# Patient Record
Sex: Male | Born: 1960 | Race: White | Hispanic: No | Marital: Married | Smoking: Never smoker
Health system: Southern US, Community
[De-identification: ages and names within clinical notes are randomized; demographics above are authoritative.]

## PROBLEM LIST (undated history)

## (undated) DIAGNOSIS — R7309 Other abnormal glucose: Secondary | ICD-10-CM

## (undated) DIAGNOSIS — E785 Hyperlipidemia, unspecified: Secondary | ICD-10-CM

## (undated) DIAGNOSIS — M1990 Unspecified osteoarthritis, unspecified site: Secondary | ICD-10-CM

## (undated) DIAGNOSIS — M069 Rheumatoid arthritis, unspecified: Secondary | ICD-10-CM

## (undated) DIAGNOSIS — K8020 Calculus of gallbladder without cholecystitis without obstruction: Secondary | ICD-10-CM

## (undated) DIAGNOSIS — M109 Gout, unspecified: Secondary | ICD-10-CM

## (undated) DIAGNOSIS — I10 Essential (primary) hypertension: Secondary | ICD-10-CM

## (undated) HISTORY — DX: Hyperlipidemia, unspecified: E78.5

## (undated) HISTORY — DX: Other abnormal glucose: R73.09

## (undated) HISTORY — DX: Essential (primary) hypertension: I10

## (undated) HISTORY — PX: WISDOM TOOTH EXTRACTION: SHX21

## (undated) HISTORY — DX: Calculus of gallbladder without cholecystitis without obstruction: K80.20

## (undated) HISTORY — DX: Unspecified osteoarthritis, unspecified site: M19.90

## (undated) HISTORY — DX: Rheumatoid arthritis, unspecified: M06.9

---

## 2002-11-03 ENCOUNTER — Ambulatory Visit (HOSPITAL_COMMUNITY): Admission: RE | Admit: 2002-11-03 | Discharge: 2002-11-03 | Payer: Self-pay | Admitting: Internal Medicine

## 2002-11-03 ENCOUNTER — Encounter: Payer: Self-pay | Admitting: Internal Medicine

## 2003-10-17 ENCOUNTER — Encounter: Admission: RE | Admit: 2003-10-17 | Discharge: 2003-10-17 | Payer: Self-pay | Admitting: Rheumatology

## 2012-01-19 ENCOUNTER — Ambulatory Visit: Payer: 59 | Admitting: Emergency Medicine

## 2012-01-19 ENCOUNTER — Emergency Department (HOSPITAL_COMMUNITY)
Admission: EM | Admit: 2012-01-19 | Discharge: 2012-01-20 | Disposition: A | Discharge status: Home / Self Care | Payer: 59 | Attending: Emergency Medicine | Admitting: Emergency Medicine

## 2012-01-19 ENCOUNTER — Encounter (HOSPITAL_COMMUNITY): Payer: Self-pay | Admitting: *Deleted

## 2012-01-19 VITALS — BP 138/85 | HR 81 | Temp 98.1°F | Resp 16 | Ht 70.0 in | Wt 282.6 lb

## 2012-01-19 DIAGNOSIS — L03119 Cellulitis of unspecified part of limb: Secondary | ICD-10-CM | POA: Insufficient documentation

## 2012-01-19 DIAGNOSIS — Z791 Long term (current) use of non-steroidal anti-inflammatories (NSAID): Secondary | ICD-10-CM | POA: Insufficient documentation

## 2012-01-19 DIAGNOSIS — M129 Arthropathy, unspecified: Secondary | ICD-10-CM | POA: Insufficient documentation

## 2012-01-19 DIAGNOSIS — R21 Rash and other nonspecific skin eruption: Secondary | ICD-10-CM | POA: Insufficient documentation

## 2012-01-19 DIAGNOSIS — L03116 Cellulitis of left lower limb: Secondary | ICD-10-CM

## 2012-01-19 DIAGNOSIS — Z79899 Other long term (current) drug therapy: Secondary | ICD-10-CM | POA: Insufficient documentation

## 2012-01-19 DIAGNOSIS — L02419 Cutaneous abscess of limb, unspecified: Secondary | ICD-10-CM | POA: Insufficient documentation

## 2012-01-19 DIAGNOSIS — R509 Fever, unspecified: Secondary | ICD-10-CM | POA: Insufficient documentation

## 2012-01-19 DIAGNOSIS — M13169 Monoarthritis, not elsewhere classified, unspecified knee: Secondary | ICD-10-CM

## 2012-01-19 DIAGNOSIS — M109 Gout, unspecified: Secondary | ICD-10-CM | POA: Insufficient documentation

## 2012-01-19 DIAGNOSIS — M1710 Unilateral primary osteoarthritis, unspecified knee: Secondary | ICD-10-CM

## 2012-01-19 DIAGNOSIS — M009 Pyogenic arthritis, unspecified: Secondary | ICD-10-CM

## 2012-01-19 HISTORY — DX: Gout, unspecified: M10.9

## 2012-01-19 LAB — BASIC METABOLIC PANEL
Calcium: 9 mg/dL (ref 8.4–10.5)
GFR calc non Af Amer: >90 mL/min (ref >90)
Glucose, Bld: 114 mg/dL — ABNORMAL HIGH (ref 70–99)
Sodium: 137 mEq/L (ref 135–145)

## 2012-01-19 LAB — POCT CBC
Granulocyte percent: 80.9 %G — AB (ref 37–80)
HCT, POC: 46.2 % (ref 43.5–53.7)
Hemoglobin: 14.6 g/dL (ref 14.1–18.1)
Lymph, poc: 2.5 (ref 0.6–3.4)
MCH, POC: 31 pg (ref 27–31.2)
MCHC: 31.6 g/dL — AB (ref 31.8–35.4)
MCV: 98.1 fL — AB (ref 80–97)
MID (cbc): 1.5 — AB (ref 0–0.9)
MPV: 8.5 fL (ref 0–99.8)
POC Granulocyte: 16.7 — AB (ref 2–6.9)
POC LYMPH PERCENT: 11.9 %L (ref 10–50)
POC MID %: 7.2 %M (ref 0–12)
Platelet Count, POC: 332 10*3/uL (ref 142–424)
RBC: 4.71 M/uL (ref 4.69–6.13)
RDW, POC: 14.4 %
WBC: 20.7 10*3/uL — AB (ref 4.6–10.2)

## 2012-01-19 LAB — POCT SEDIMENTATION RATE: POCT SED RATE: 77 mm/hr — AB (ref 0–22)

## 2012-01-19 MED ORDER — SODIUM CHLORIDE 0.9 % IV BOLUS (SEPSIS)
1000.0000 mL | Freq: Once | INTRAVENOUS | Status: AC
  Administered 2012-01-19: 1000 mL via INTRAVENOUS

## 2012-01-19 MED ORDER — ACETAMINOPHEN 325 MG PO TABS
650.0000 mg | ORAL_TABLET | Freq: Once | ORAL | Status: AC
  Administered 2012-01-19: 650 mg via ORAL
  Filled 2012-01-19: qty 2

## 2012-01-19 MED ORDER — CLINDAMYCIN PHOSPHATE 900 MG/50ML IV SOLN
900.0000 mg | Freq: Once | INTRAVENOUS | Status: AC
  Administered 2012-01-19: 900 mg via INTRAVENOUS
  Filled 2012-01-19: qty 50

## 2012-01-19 MED ORDER — CLINDAMYCIN PHOSPHATE 900 MG/50ML IV SOLN: 900.0000 mg | Freq: Once | INTRAVENOUS | Status: DC

## 2012-01-19 MED ORDER — IBUPROFEN 400 MG PO TABS
400.0000 mg | ORAL_TABLET | Freq: Once | ORAL | Status: AC
  Administered 2012-01-19: 400 mg via ORAL
  Filled 2012-01-19: qty 1

## 2012-01-19 MED ORDER — CLINDAMYCIN HCL 300 MG PO CAPS: 300.0000 mg | ORAL_CAPSULE | Freq: Four times a day (QID) | ORAL | Status: DC

## 2012-01-19 NOTE — ED Notes (Signed)
Pt c/o bing cold.  Temp checked elevated

## 2012-01-19 NOTE — ED Provider Notes (Signed)
History     CSN: [id number]  Arrival date & time 01/19/12  1708   First MD Initiated Contact with Patient 01/19/12 2002      Chief Complaint  Patient presents with  . Knee Pain   HPI  History provided by the patient. Patient is a 52 year old male with past history of gout and arthritis who presents with complaints of increased left knee pain and swelling. Patient reports having some waxing waning left knee pain and swelling over the past few days. Patient plan to call his rheumatologist today but was unable to be seen in the office and was seen at a local urgent care Center. Patient has had some associated symptoms of chills and sweats and during evaluation at the urgent care Center was found to be febrile and have an elevated WBC. Patient was sent to the emergency room for further evaluation and to rule out septic joint. Patient states that he has some pain in the knee is worse with standing but this occasionally improves with increased movement and walking. Currently he denies significant pains does report having some chills. Patient otherwise states that he feels well. Patient was initially treating his pain and symptoms with ibuprofen and prednisone which she had at home. Patient reports taking 30 mg for the past 3 days. He denies any other associated symptoms. Denies any numbness or weakness. Denies any erythematous streaks.     Past Medical History  Diagnosis Date  . Arthritis   . Gout     History reviewed. No pertinent past surgical history.  History reviewed. No pertinent family history.  History  Substance Use Topics  . Smoking status: Never Smoker   . Smokeless tobacco: Not on file  . Alcohol Use: Yes      Review of Systems  Constitutional: Positive for fever and chills.  Musculoskeletal:       Left knee pain and swelling  All other systems reviewed and are negative.    Allergies  Review of patient's allergies indicates no known allergies.  Home Medications     Current Outpatient Rx  Name  Route  Sig  Dispense  Refill  . ALLOPURINOL 300 MG PO TABS   Oral   Take 300 mg by mouth 2 (two) times a week. On Friday and Tuesday         . AMMONIUM LACTATE 12 % EX LOTN   Topical   Apply 1 application topically as needed.         Marland Kitchen ETANERCEPT 50 MG/ML [place] SOLN   Subcutaneous   Inject 50 mg into the skin once a week. On sundays         . OMEGA-3 FATTY ACIDS 1000 MG PO CAPS   Oral   Take 2 g by mouth 2 (two) times daily.         Marland Kitchen FOLIC ACID 1 MG PO TABS   Oral   Take 1 mg by mouth daily.         . IBUPROFEN 200 MG PO TABS   Oral   Take 800 mg by mouth every 6 (six) hours as needed. For pain         . METHOTREXATE (ANTI-RHEUMATIC) 2.5 MG PO TABS   Oral   Take 7.5 mg by mouth 2 (two) times a week. Patient takes 2 tablets in Tuesdays and 3 tablets on Wednesday         . ONE-DAILY MULTI VITAMINS PO TABS   Oral   Take 1  tablet by mouth daily.         Marland Kitchen PREDNISONE 5 MG PO TABS   Oral   Take 5 mg by mouth 2 (two) times daily.         Marland Kitchen VITAMIN C 500 MG PO TABS   Oral   Take 500 mg by mouth 2 (two) times daily.           BP 143/69  Pulse 98  Temp 100.6 F (38.1 C) (Oral)  Resp 20  SpO2 98%  Physical Exam  Nursing note and vitals reviewed. Constitutional: He is oriented to person, place, and time. He appears well-developed and well-nourished. No distress.  HENT:  Head: Normocephalic.  Cardiovascular: Normal rate and regular rhythm.   Pulmonary/Chest: Effort normal and breath sounds normal. No respiratory distress. He has no wheezes. He has no rales.  Musculoskeletal: He exhibits edema and tenderness.       Very slightly reduced ROM in left knee.  Knee is diffusly swollen with erythema and increased warm.  Normal distal sensations and pulses in foot.    Neurological: He is alert and oriented to person, place, and time.  Skin: He is diaphoretic.  Psychiatric: He has a normal mood and affect. His behavior is  normal.    ED Course  Procedures   Results for orders placed in visit on 01/19/12  POCT CBC      Component Value Range   WBC 20.7 (*) 4.6 - 10.2 K/uL   Lymph, poc 2.5  0.6 - 3.4   POC LYMPH PERCENT 11.9  10 - 50 %L   MID (cbc) 1.5 (*) 0 - 0.9   POC MID % 7.2  0 - 12 %M   POC Granulocyte 16.7 (*) 2 - 6.9   Granulocyte percent 80.9 (*) 37 - 80 %G   RBC 4.71  4.69 - 6.13 M/uL   Hemoglobin 14.6  14.1 - 18.1 g/dL   HCT, POC 16.1  09.6 - 53.7 %   MCV 98.1 (*) 80 - 97 fL   MCH, POC 31.0  27 - 31.2 pg   MCHC 31.6 (*) 31.8 - 35.4 g/dL   RDW, POC 04.5     Platelet Count, POC 332  142 - 424 K/uL   MPV 8.5  0 - 99.8 fL  POCT SEDIMENTATION RATE      Component Value Range   POCT SED RATE 77 (*) 0 - 22 mm/hr   Results for orders placed during the hospital encounter of 01/19/12  BASIC METABOLIC PANEL      Component Value Range   Sodium 137  135 - 145 mEq/L   Potassium 3.5  3.5 - 5.1 mEq/L   Chloride 102  96 - 112 mEq/L   CO2 25  19 - 32 mEq/L   Glucose, Bld 114 (*) 70 - 99 mg/dL   BUN 20  6 - 23 mg/dL   Creatinine, Ser 4.09  0.50 - 1.35 mg/dL   Calcium 9.0  8.4 - 81.1 mg/dL   GFR calc non Af Amer >90  >90 mL/min   GFR calc Af Amer >90  >90 mL/min       1. Cellulitis of left knee       MDM  8:10PM Pt seen and evaluated.  Pt does not appear in any acute distress.  Patient seen and discussed with attending physician. Will plan to consult orthopedics though very low suspicion for septic joint at this time.  Spoke with Dr. Ave Filter with  orthopedics. He will see patient for further evaluation.  Dr. Ave Filter has seen patient is feel symptoms are consistent with septic joint are more consistent with superficial cellulitis. He feels patient can be treated with antibiotics for cellulitis.  Spoke with [facility] hospitalists for possible admission. Patient has no significant medical history specifically no history for any immunocompromising health problems, or diabetes. Patient prefers  to return home at this time is felt that he may return home for attempt at outpatient treatment for cellulitis. Will give first dose of IV clindamycin in the emergency room. Patient given strict return precautions.      Angus Seller, [place] 01/20/12 [phone]

## 2012-01-19 NOTE — ED Notes (Signed)
Pt reports having onset of left knee pain and inflammation on 1/1, thought it was arthritis but now having more pain, swelling, redness. Went to urgent medical and sent here to r/o septic knee. They reported wbc 21 and fevers/chills.

## 2012-01-19 NOTE — Progress Notes (Signed)
[facility] [address], [place] [address] [phone]  Date:  01/19/2012   Name:  Thomas Waller   DOB:  06-30-1960   MRN:  [id number]  PCP:  No primary provider on file.    Chief Complaint: Knee Pain   History of Present Illness:  Thomas Waller is a 52 y.o. very pleasant male patient who presents with the following:  Has rheumatoid arthritis.  On a number of meds; methotrexate, prednisone, enbrel.  Has three day history of increasing pain and redness in the left knee.  Has swelling and pain.  Hot to touch.  Limited mobility and weight bearing due to pain.  Fever last night and chills yesterday and today.  No further fever today.  Pain increasing  There is no problem list on file for this patient.   Past Medical History  Diagnosis Date  . Arthritis     History reviewed. No pertinent past surgical history.  History  Substance Use Topics  . Smoking status: Never Smoker   . Smokeless tobacco: Not on file  . Alcohol Use: Yes    History reviewed. No pertinent family history.  No Known Allergies  Medication list has been reviewed and updated.  Current Outpatient Prescriptions on File Prior to Visit  Medication Sig Dispense Refill  . allopurinol (ZYLOPRIM) 300 MG tablet Take 300 mg by mouth 2 (two) times a week.      . methotrexate (RHEUMATREX) 2.5 MG tablet Take 7.5 mg by mouth 3 (three) times a week.        Review of Systems:  As per HPI, otherwise negative.    Physical Examination: Filed Vitals:   01/19/12 1455  BP: 138/85  Pulse: 81  Temp: 98.1 F (36.7 C)  Resp: 16   Filed Vitals:   01/19/12 1455  Height: 5\' 10"  (1.778 m)  Weight: 282 lb 9.6 oz (128.187 kg)   Body mass index is 40.55 kg/(m^2). Ideal Body Weight: Weight in (lb) to have BMI = 25: 173.9    GEN: WDWN, NAD, Non-toxic, Alert & Oriented x 3 HEENT: Atraumatic, Normocephalic.  Ears and Nose: No external deformity. EXTR: No clubbing/cyanosis. Left knee swollen  and red with proximal and distal cellulitis.  Left calf taut and swollen not tender.   NEURO: Normal gait.  PSYCH: Normally interactive. Conversant. Not depressed or anxious appearing.  Calm demeanor.    Assessment and Plan: Purulent effusion knee vs cellulitis knee CBC Sed rate TO ER for evaluation as we cannot ignore the possibility of a septic joint in this immunocompromised patient.  Carmelina Dane, MD  Results for orders placed in visit on 01/19/12  POCT CBC      Component Value Range   WBC 20.7 (*) 4.6 - 10.2 K/uL   Lymph, poc 2.5  0.6 - 3.4   POC LYMPH PERCENT 11.9  10 - 50 %L   MID (cbc) 1.5 (*) 0 - 0.9   POC MID % 7.2  0 - 12 %M   POC Granulocyte 16.7 (*) 2 - 6.9   Granulocyte percent 80.9 (*) 37 - 80 %G   RBC 4.71  4.69 - 6.13 M/uL   Hemoglobin 14.6  14.1 - 18.1 g/dL   HCT, POC 69.6  29.5 - 53.7 %   MCV 98.1 (*) 80 - 97 fL   MCH, POC 31.0  27 - 31.2 pg   MCHC 31.6 (*) 31.8 - 35.4 g/dL   RDW, POC 14.4  Platelet Count, POC 332  142 - 424 K/uL   MPV 8.5  0 - 99.8 fL

## 2012-01-19 NOTE — Consult Note (Signed)
Reason for Consult:Left knee pain, swelling Referring Physician: AFTON, MIKELSON is an 52 y.o. male.  HPI: Pt with a history of gout and osteoarthritis in his knee presents with left knee pain, redness and swelling. Pt states symptoms first started 01/17/12 when he noticed some redness over his knee cap. He also started having more increased knee discomfort, states this pain only occurred when he began walking from a seated position and resolved after a few steps. Has no knee pain at rest and with increased ambulation. Does not wake him from sleep. Has noticed increased erythema over the last few days. Yesterday afternoon began developing symptoms of fever, chills, malaise and sweats. Went to urgent care this morning and was sent to ED after WBC count was elevated with temperature of 100 degrees. Now, continues to feel chills and malaise. Denies knee pain. Denies trauma.  Past Medical History  Diagnosis Date  . Arthritis   . Gout     History reviewed. No pertinent past surgical history.  History reviewed. No pertinent family history.  Social History:  reports that he has never smoked. He does not have any smokeless tobacco history on file. He reports that he drinks alcohol. He reports that he does not use illicit drugs.  Allergies: No Known Allergies  Medications: I have reviewed the patient's current medications.  Results for orders placed during the hospital encounter of 01/19/12 (from the past 48 hour(s))  BASIC METABOLIC PANEL     Status: Abnormal   Collection Time   01/19/12  8:54 PM      Component Value Range Comment   Sodium 137  135 - 145 mEq/L    Potassium 3.5  3.5 - 5.1 mEq/L    Chloride 102  96 - 112 mEq/L    CO2 25  19 - 32 mEq/L    Glucose, Bld 114 (*) 70 - 99 mg/dL    BUN 20  6 - 23 mg/dL    Creatinine, Ser 3.08  0.50 - 1.35 mg/dL    Calcium 9.0  8.4 - 65.7 mg/dL    GFR calc non Af Amer >90  >90 mL/min    GFR calc Af Amer >90  >90 mL/min     No results  found.  Review of Systems  Constitutional: Positive for fever and chills.  Musculoskeletal: Negative for myalgias and falls.       Left knee pain with start up ambulation, none at rest.  Skin: Positive for rash. Negative for itching.       On left knee   All other systems reviewed and are negative.   Blood pressure 143/69, pulse 98, temperature 99.8 F (37.7 C), temperature source Oral, resp. rate 20, SpO2 98.00%. Physical Exam  Constitutional: He is oriented to person, place, and time. He appears well-developed and well-nourished.  HENT:  Head: Normocephalic and atraumatic.  Eyes: EOM are normal. Pupils are equal, round, and reactive to light.  Respiratory: Effort normal and breath sounds normal.  Musculoskeletal:       Left knee with moderate swelling, erythema and warmth. Full and painless ROM. No tenderness to palpation. No palpable effusion. No calf tenderness.  Neurological: He is alert and oriented to person, place, and time.  Skin: Skin is warm and dry.       Erythematous rash consistent with cellulitis is appreciated around the knee joint.  Psychiatric: He has a normal mood and affect. His behavior is normal.    Assessment/Plan: Left knee swelling,  erythema with elevated WBC and constitutional symptoms, has full painless ROM. Most likely cellulitis with underlying osteoarthritis. Left knee septic joint unlikely. Will not aspirate Okay to admit and start antibiotics per primary team Will continue to follow  Camrynn Mcclintic 01/19/2012, 9:51 PM

## 2012-01-19 NOTE — ED Provider Notes (Signed)
52 year old male was transferred here from urgent care because of concern about a septic joint. He has been having pain in his left knee for several days and self medicated with her prednisone and ibuprofen. He states his knee hurts the first couple of steps when he tries to walk, but then stops hurting. In the emergency department, he has started to run a fever. There is erythema surrounding the knee but in a pattern that seems more typical of cellulitis. There is a small to moderate joint effusion but there is full range of motion without significant pain. WBC at urgent care was 20,000. I am reluctant to attempt joint aspiration since I then you would be going through the skin that is clinically infected. He'll be started on antibiotics and orthopedic consultation obtained.  Medical screening examination/treatment/procedure(s) were conducted as a shared visit with non-physician practitioner(s) and myself.  I personally evaluated the patient during the encounter   Dione Booze, MD 01/19/12 2050

## 2012-01-19 NOTE — Progress Notes (Signed)
Reviewed and agree.

## 2012-01-20 NOTE — Consult Note (Signed)
Agree with above.  Appears to be only superficial cellulitis without joint or bursal involvement.  Should respond to antibiotics, no surgery indicated.

## 2012-11-16 ENCOUNTER — Encounter: Payer: Self-pay | Admitting: Internal Medicine

## 2012-11-16 DIAGNOSIS — R7309 Other abnormal glucose: Secondary | ICD-10-CM | POA: Insufficient documentation

## 2012-11-16 DIAGNOSIS — E782 Mixed hyperlipidemia: Secondary | ICD-10-CM | POA: Insufficient documentation

## 2012-11-16 DIAGNOSIS — M1000 Idiopathic gout, unspecified site: Secondary | ICD-10-CM | POA: Insufficient documentation

## 2012-11-16 DIAGNOSIS — M1990 Unspecified osteoarthritis, unspecified site: Secondary | ICD-10-CM | POA: Insufficient documentation

## 2012-11-16 DIAGNOSIS — I10 Essential (primary) hypertension: Secondary | ICD-10-CM | POA: Insufficient documentation

## 2012-11-25 ENCOUNTER — Encounter: Payer: Self-pay | Admitting: Internal Medicine

## 2012-12-01 ENCOUNTER — Other Ambulatory Visit: Payer: Self-pay | Admitting: Internal Medicine

## 2013-01-02 ENCOUNTER — Other Ambulatory Visit: Payer: Self-pay | Admitting: Internal Medicine

## 2013-01-07 ENCOUNTER — Encounter: Payer: Self-pay | Admitting: Emergency Medicine

## 2013-01-07 ENCOUNTER — Ambulatory Visit (INDEPENDENT_AMBULATORY_CARE_PROVIDER_SITE_OTHER): Payer: 59 | Admitting: Emergency Medicine

## 2013-01-07 ENCOUNTER — Ambulatory Visit: Payer: Self-pay | Admitting: Internal Medicine

## 2013-01-07 VITALS — BP 138/96 | HR 90 | Temp 98.4°F | Resp 18 | Wt 270.0 lb

## 2013-01-07 DIAGNOSIS — J309 Allergic rhinitis, unspecified: Secondary | ICD-10-CM

## 2013-01-07 DIAGNOSIS — J209 Acute bronchitis, unspecified: Secondary | ICD-10-CM

## 2013-01-07 DIAGNOSIS — J329 Chronic sinusitis, unspecified: Secondary | ICD-10-CM

## 2013-01-07 MED ORDER — AZITHROMYCIN 250 MG PO TABS: ORAL_TABLET | ORAL | Status: AC

## 2013-01-07 MED ORDER — PREDNISONE 10 MG PO TABS: ORAL_TABLET | ORAL | Status: DC

## 2013-01-07 MED ORDER — BENZONATATE 100 MG PO CAPS: 100.0000 mg | ORAL_CAPSULE | Freq: Three times a day (TID) | ORAL | Status: DC | PRN

## 2013-01-07 NOTE — Progress Notes (Signed)
Subjective:    Patient ID: Thomas Waller, male    DOB: 09-29-1960, 52 y.o.   MRN: [id number]  HPI Comments: 52 yo male with 3 days of increased congestion in head, sore throat, ears full and mild production with cough. Nyquil/ dayquill/ OTC allergy no relief.   Current Outpatient Prescriptions on File Prior to Visit  Medication Sig Dispense Refill  . allopurinol (ZYLOPRIM) 300 MG tablet Take 300 mg by mouth 2 (two) times a week. On Friday and Tuesday      . ammonium lactate (LAC-HYDRIN) 12 % lotion Apply 1 application topically as needed.      . B-D 3CC LUER-LOK SYR 21GX1" 21G X 1" 3 ML MISC USE EVERY 2 WEEKS  5 each  PRN  . cholecalciferol (VITAMIN D) 1000 UNITS tablet Take 10 Units by mouth daily. 10,000 IU daily      . etanercept (ENBREL) 50 MG/ML injection Inject 50 mg into the skin once a week. On sundays      . fish oil-omega-3 fatty acids 1000 MG capsule Take 2 g by mouth 2 (two) times daily.      . folic acid (FOLVITE) 1 MG tablet Take 1 mg by mouth daily.      Marland Kitchen ibuprofen (ADVIL,MOTRIN) 200 MG tablet Take 800 mg by mouth every 6 (six) hours as needed. For pain      . methotrexate (RHEUMATREX) 2.5 MG tablet Take 7.5 mg by mouth 2 (two) times a week. Patient takes 2 tablets in Tuesdays and 3 tablets on Wednesday      . Multiple Vitamin (MULTIVITAMIN) tablet Take 1 tablet by mouth daily.      . predniSONE (DELTASONE) 5 MG tablet Take 5 mg by mouth 2 (two) times daily.      Marland Kitchen testosterone cypionate (DEPOTESTOTERONE CYPIONATE) 200 MG/ML injection inject 2 milliliters intramuscularly EVERY 2 WEEKS  10 mL  1  . vitamin B-12 (CYANOCOBALAMIN) 1000 MCG tablet Take 1,000 mcg by mouth daily.      . vitamin C (ASCORBIC ACID) 500 MG tablet Take 500 mg by mouth 2 (two) times daily.      . clindamycin (CLEOCIN) 300 MG capsule Take 1 capsule (300 mg total) by mouth 4 (four) times daily. X 7 days  28 capsule  0   No current facility-administered medications on file prior to visit.    Review of  patient's allergies indicates no known allergies.  Past Medical History  Diagnosis Date  . Gout   . Hypertension   . Elevated hemoglobin A1c measurement   . Hyperlipidemia   . Arthritis     Reiters Polyarthritis      Review of Systems  Constitutional: Positive for fatigue.  HENT: Positive for congestion, postnasal drip, sinus pressure and sore throat.   Respiratory: Positive for cough.   All other systems reviewed and are negative.   BP 138/96  Pulse 90  Temp(Src) 98.4 F (36.9 C) (Temporal)  Resp 18  Wt 270 lb (122.471 kg)     Objective:   Physical Exam  Nursing note and vitals reviewed. Constitutional: He is oriented to person, place, and time. He appears well-developed and well-nourished.  HENT:  Head: Normocephalic and atraumatic.  Right Ear: External ear normal.  Left Ear: External ear normal.  Nose: Nose normal.  Mouth/Throat: Oropharynx is clear and moist. No oropharyngeal exudate.  Yellow TMs mildly injected, mild post pharynx erythema  Eyes: Conjunctivae are normal.  Neck: Normal range of motion.  Cardiovascular: Normal rate,  regular rhythm, normal heart sounds and intact distal pulses.   Pulmonary/Chest: Effort normal and breath sounds normal.  Abdominal: Soft.  Musculoskeletal: Normal range of motion.  Lymphadenopathy:    He has no cervical adenopathy.  Neurological: He is alert and oriented to person, place, and time.  Skin: Skin is warm and dry.  Psychiatric: He has a normal mood and affect. Judgment normal.          Assessment & Plan:  1. Sinusitis/ Bronchitis/ Allergic rhinitis- Zpak, Pred 10 mg DP, Tessalon Perles 100mg  all AD. Increase H2o, Allegra OTC AD

## 2013-01-07 NOTE — Patient Instructions (Signed)
Allergic Rhinitis Allergic rhinitis is when the mucous membranes in the nose respond to allergens. Allergens are particles in the air that cause your body to have an allergic reaction. This causes you to release allergic antibodies. Through a chain of events, these eventually cause you to release histamine into the blood stream (hence the use of antihistamines). Although meant to be protective to the body, it is this release that causes your discomfort, such as frequent sneezing, congestion and an itchy runny nose.  CAUSES  The pollen allergens may come from grasses, trees, and weeds. This is seasonal allergic rhinitis, or "hay fever." Other allergens cause year-round allergic rhinitis (perennial allergic rhinitis) such as house dust mite allergen, pet dander and mold spores.  SYMPTOMS   Nasal stuffiness (congestion).  Runny, itchy nose with sneezing and tearing of the eyes.  There is often an itching of the mouth, eyes and ears. It cannot be cured, but it can be controlled with medications. DIAGNOSIS  If you are unable to determine the offending allergen, skin or blood testing may find it. TREATMENT   Avoid the allergen.  Medications and allergy shots (immunotherapy) can help.  Hay fever may often be treated with antihistamines in pill or nasal spray forms. Antihistamines block the effects of histamine. There are over-the-counter medicines that may help with nasal congestion and swelling around the eyes. Check with your caregiver before taking or giving this medicine. If the treatment above does not work, there are many new medications your caregiver can prescribe. Stronger medications may be used if initial measures are ineffective. Desensitizing injections can be used if medications and avoidance fails. Desensitization is when a patient is given ongoing shots until the body becomes less sensitive to the allergen. Make sure you follow up with your caregiver if problems continue. SEEK MEDICAL  CARE IF:   You develop fever (more than 100.5 F (38.1 C).  You develop a cough that does not stop easily (persistent).  You have shortness of breath.  You start wheezing.  Symptoms interfere with normal daily activities. Document Released: 09/27/2000 Document Revised: 03/27/2011 Document Reviewed: 04/08/2008 [employer] Patient Information 2014 [employer]. Sinusitis Sinusitis is redness, soreness, and puffiness (inflammation) of the air pockets in the bones of your face (sinuses). The redness, soreness, and puffiness can cause air and mucus to get trapped in your sinuses. This can allow germs to grow and cause an infection.  HOME CARE   Drink enough fluids to keep your pee (urine) clear or pale yellow.  Use a humidifier in your home.  Run a hot shower to create steam in the bathroom. Sit in the bathroom with the door closed. Breathe in the steam 3 4 times a day.  Put a warm, moist washcloth on your face 3 4 times a day, or as told by your doctor.  Use salt water sprays (saline sprays) to wet the thick fluid in your nose. This can help the sinuses drain.  Only take medicine as told by your doctor. GET HELP RIGHT AWAY IF:   Your pain gets worse.  You have very bad headaches.  You are sick to your stomach (nauseous).  You throw up (vomit).  You are very sleepy (drowsy) all the time.  Your face is puffy (swollen).  Your vision changes.  You have a stiff neck.  You have trouble breathing. MAKE SURE YOU:   Understand these instructions.  Will watch your condition.  Will get help right away if you are not doing well   or get worse. Document Released: 06/21/2007 Document Revised: 09/27/2011 Document Reviewed: 08/08/2011 [employer] Patient Information 2014 [employer].  

## 2013-01-08 ENCOUNTER — Ambulatory Visit: Payer: Self-pay | Admitting: Physician Assistant

## 2013-02-25 ENCOUNTER — Other Ambulatory Visit: Payer: Self-pay | Admitting: Internal Medicine

## 2013-06-21 ENCOUNTER — Encounter: Payer: Self-pay | Admitting: Internal Medicine

## 2013-06-21 DIAGNOSIS — Z79899 Other long term (current) drug therapy: Secondary | ICD-10-CM | POA: Insufficient documentation

## 2013-06-21 DIAGNOSIS — E559 Vitamin D deficiency, unspecified: Secondary | ICD-10-CM | POA: Insufficient documentation

## 2013-06-21 NOTE — Progress Notes (Signed)
Patient ID: Thomas Waller, male   DOB: 1960-03-12, 53 y.o.   MRN: [id number]   Annual Screening Comprehensive Examination  This very nice 53 y.o.MWM presents for complete physical.  Patient has been followed for labile HTN, Morbid Obesity & Insulin Resistance, Prediabetes, Hyperlipidemia, and Vitamin D Deficiency.   Patient's labile HTN predates since Jan 2014 and has been monitored expectantly. Patient's BP has been controlled and today's BP: 146/82 mmHg is slightly elevated.  Patient denies any cardiac symptoms as chest pain, palpitations, shortness of breath, dizziness or ankle swelling.   Patient's hyperlipidemia is controlled with diet. Patient denies myalgias or other medication SE's. Last cholesterol last visit was 187, triglycerides 212, HDL 37 and LDL 108 in Sept 2014.     Patient has Morbid Obesity with BMI 39 and consequent prediabetes and insulin resistance with A1c 6.0% in Jan 2014 and last A1c 5.6% with elevated insulin 139 in Sept 2014. Patient denies reactive hypoglycemic symptoms, visual blurring, diabetic polys or paresthesias.    Finally, patient has history of Vitamin D Deficiency of 51 in Mar  2014 and last vitamin D was 65 in Sept 2014.  Medication Sig  . allopurinol  300 MG tablet Take 300 mg by mouth 2 (two) times a week. On Friday and Tuesday  . LAC-HYDRIN 12 % lotion Apply 1 application topically as needed.  Marland Kitchen 3CC LUER-LOK SYR 21GX1"  3 ML  USE EVERY 2 WEEKS  . VITAMIN D UNITS tablet Take 10,000 IU daily  . ENBREL) 50 MG/ML inj Inject 50 mg into the skin once a week. On sundays  . fish oil1000 MG cap Take 2 g by mouth 2 (two) times daily.  . folic acid  1 MG tablet Take 1 mg by mouth daily.  Marland Kitchen ibuprofen ( 200 MG tablet Take 800 mg by mouth every 6 (six) hours as needed. For pain  . methotrexate  2.5 MG tablet  Patient takes 2 tablets in Tuesdays and 3 tablets on Wednesday  . MULTIVITAMIN Take 1 tablet by mouth daily.  . predniSONE  5 MG tablet Take 5 mg by mouth 2  (two) times daily.  . DEPOTESTOTERONE CYP 200 MG/ML  inject 2 milliliters intramuscularly EVERY 2 WEEKS  . vitamin B-12  1000 MCG tab Take 1,000 mcg by mouth daily.  . vitamin C  500 MG tablet Take 500 mg by mouth 2 (two) times daily.   No Known Allergies  Past Medical History  Diagnosis Date  . Gout   . Hypertension   . Elevated hemoglobin A1c measurement   . Hyperlipidemia   . Arthritis     Reiters Polyarthritis  . Vitamin D deficiency 2013    History reviewed. No pertinent past surgical history.  No family history on file.  History   Social History  . Marital Status: Married    Spouse Name: N/A    Number of Children: N/A  . Years of Education: N/A   Occupational History  . Not on file.   Social History Main Topics  . Smoking status: Never Smoker   . Smokeless tobacco: Not on file  . Alcohol Use: Yes  . Drug Use: No  . Sexual Activity: Yes    Birth Control/ Protection: None    ROS Constitutional: Denies fever, chills, weight loss/gain, headaches, insomnia, fatigue, night sweats or change in appetite. Eyes: Denies redness, blurred vision, diplopia, discharge, itchy or watery eyes.  ENT: Denies discharge, congestion, post nasal drip, epistaxis, sore throat, earache, hearing loss, dental  pain, Tinnitus, Vertigo, Sinus pain or snoring.  Cardio: Denies chest pain, palpitations, irregular heartbeat, syncope, dyspnea, diaphoresis, orthopnea, PND, claudication or edema Respiratory: denies cough, dyspnea, DOE, pleurisy, hoarseness, laryngitis or wheezing.  Gastrointestinal: Denies dysphagia, heartburn, reflux, water brash, pain, cramps, nausea, vomiting, bloating, diarrhea, constipation, hematemesis, melena, hematochezia, jaundice or hemorrhoids Genitourinary: Denies dysuria, frequency, urgency, nocturia, hesitancy, discharge, hematuria or flank pain Musculoskeletal: Denies arthralgia, myalgia, stiffness, Jt. Swelling, pain, limp or strain/sprain. Skin: Denies puritis,  rash, hives, warts, acne, eczema or change in skin lesion Neuro: No weakness, tremor, incoordination, spasms, paresthesia or pain Psychiatric: Denies confusion, memory loss or sensory loss Endocrine: Denies change in weight, skin, hair change, nocturia, and paresthesia, diabetic polys, visual blurring or hyper / hypo glycemic episodes.  Heme/Lymph: No excessive bleeding, bruising or enlarged lymph nodes.  Physical Exam  BP 146/82  Pulse 80  T 99 F  R 16  Ht 5\' 11"   Wt 281 lb  BMI 39.21 kg/m2  General Appearance: Well nourished, in no apparent distress. Eyes: PERRLA, EOMs, conjunctiva no swelling or erythema, normal fundi and vessels. Sinuses: No frontal/maxillary tenderness ENT/Mouth: EACs patent / TMs  nl. Nares clear without erythema, swelling, mucoid exudates. Oral hygiene is good. No erythema, swelling, or exudate. Tongue normal, non-obstructing. Tonsils not swollen or erythematous. Hearing normal.  Neck: Supple, thyroid normal. No bruits, nodes or JVD. Respiratory: Respiratory effort normal.  BS equal and clear bilateral without rales, rhonci, wheezing or stridor. Cardio: Heart sounds are normal with regular rate and rhythm and no murmurs, rubs or gallops. Peripheral pulses are normal and equal bilaterally without edema. No aortic or femoral bruits. Chest: symmetric with normal excursions and percussion.  Abdomen: Flat, soft, with bowl sounds. Nontender, no guarding, rebound, hernias, masses, or organomegaly.  Lymphatics: Non tender without lymphadenopathy.  Genitourinary: No hernias.Testes nl. DRE - prostate nl for age - smooth & firm w/o nodules. Musculoskeletal: Full ROM all peripheral extremities, joint stability, 5/5 strength, and normal gait. Skin: Warm and dry without rashes, lesions, cyanosis, clubbing or  ecchymosis.  Neuro: Cranial nerves intact, reflexes equal bilaterally. Normal muscle tone, no cerebellar symptoms. Sensation intact.  Pysch: Awake and oriented X 3,  normal affect, insight and judgment appropriate.   Assessment and Plan  1. Annual Screening Examination 2. Hypertension  3. Hyperlipidemia 4. Pre Diabetes 5. Vitamin D Deficiency 6. Morbid Obesity 7. Reiter's Polyarthritis 8. Gout  Continue prudent diet as discussed, weight control, BP monitoring, regular exercise, and medications as discussed.  Discussed med effects and SE's. Routine screening labs and tests as requested with regular follow-up as recommended.  Patient declined recommendations for overdue screening colonoscopy, but did agree to [facility] if his insurance carrier will cover.

## 2013-06-21 NOTE — Patient Instructions (Addendum)
Buy Dr Fara Olden Fuhrman's Book  "The End of Dieting"    Get Book  & Audio CD's   From   [email or web address]    Obesity Obesity is defined as having too much total body fat and a body mass index (BMI) of 30 or more. BMI is an estimate of body fat and is calculated from your height and weight. Obesity happens when you consume more calories than you can burn by exercising or performing daily physical tasks. Prolonged obesity can cause major illnesses or emergencies, such as:   A stroke.  Heart disease.  Diabetes.  Cancer.  Arthritis.  High blood pressure (hypertension).  High cholesterol.  Sleep apnea.  Erectile dysfunction.  Infertility problems. CAUSES   Regularly eating unhealthy foods.  Physical inactivity.  Certain disorders, such as an underactive thyroid (hypothyroidism), Cushing's syndrome, and polycystic ovarian syndrome.  Certain medicines, such as steroids, some depression medicines, and antipsychotics.  Genetics.  Lack of sleep. DIAGNOSIS  A caregiver can diagnose obesity after calculating your BMI. Obesity will be diagnosed if your BMI is 30 or higher.  There are other methods of measuring obesity levels. Some other methods include measuring your skin fold thickness, your waist circumference, and comparing your hip circumference to your waist circumference. TREATMENT  A healthy treatment program includes some or all of the following:  Long-term dietary changes.  Exercise and physical activity.  Behavioral and lifestyle changes.  Medicine only under the supervision of your caregiver. Medicines may help, but only if they are used with diet and exercise programs. An unhealthy treatment program includes:  Fasting.  Fad diets.  Supplements and drugs. These choices do not succeed in long-term weight control.  HOME CARE INSTRUCTIONS   Exercise and perform physical activity as directed by your caregiver. To increase physical activity, try the  following:  Use stairs instead of elevators.  Park farther away from store entrances.  Garden, bike, or walk instead of watching television or using the computer.  Eat healthy, low-calorie foods and drinks on a regular basis. Eat more fruits and vegetables. Use low-calorie cookbooks or take healthy cooking classes.  Limit fast food, sweets, and processed snack foods.  Eat smaller portions.  Keep a daily journal of everything you eat. There are many free websites to help you with this. It may be helpful to measure your foods so you can determine if you are eating the correct portion sizes.  Avoid drinking alcohol. Drink more water and drinks without calories.  Take vitamins and supplements only as recommended by your caregiver.  Weight-loss support groups, Nurse, mental health, counselors, and stress reduction education can also be very helpful. SEEK IMMEDIATE MEDICAL CARE IF:  You have chest pain or tightness.  You have trouble breathing or feel short of breath.  You have weakness or leg numbness.  You feel confused or have trouble talking.  You have sudden changes in your vision. MAKE SURE YOU:  Understand these instructions.  Will watch your condition.  Will get help right away if you are not doing well or get worse. Document Released: 02/10/2004 Document Revised: 07/04/2011 Document Reviewed: 02/08/2011 [facility] Patient Information 2014 [place].    Ringworm, Nail A fungal infection of the nail (tinea unguium/onychomycosis) is common. It is common as the visible part of the nail is composed of dead cells which have no blood supply to help prevent infection. It occurs because fungi are everywhere and will pick any opportunity to grow on any dead material. Because nails are very  slow growing they require up to 2 years of treatment with anti-fungal medications. The entire nail back to the base is infected. This includes approximately  of the nail which you cannot  see. If your caregiver has prescribed a medication by mouth, take it every day and as directed. No progress will be seen for at least 6 to 9 months. Do not be disappointed! Because fungi live on dead cells with little or no exposure to blood supply, medication delivery to the infection is slow; thus the cure is slow. It is also why you can observe no progress in the first 6 months. The nail becoming cured is the base of the nail, as it has the blood supply. Topical medication such as creams and ointments are usually not effective. Important in successful treatment of nail fungus is closely following the medication regimen that your doctor prescribes. Sometimes you and your caregiver may elect to speed up this process by surgical removal of all the nails. Even this may still require 6 to 9 months of additional oral medications. See your caregiver as directed. Remember there will be no visible improvement for at least 6 months. See your caregiver sooner if other signs of infection (redness and swelling) develop. Document Released: 12/31/1999 Document Revised: 03/27/2011 Document Reviewed: 03/10/2008 [facility] Patient Information 2014 [place], [place].   Hypertension As your heart beats, it forces blood through your arteries. This force is your blood pressure. If the pressure is too high, it is called hypertension (HTN) or high blood pressure. HTN is dangerous because you may have it and not know it. High blood pressure may mean that your heart has to work harder to pump blood. Your arteries may be narrow or stiff. The extra work puts you at risk for heart disease, stroke, and other problems.  Blood pressure consists of two numbers, a higher number over a lower, 110/72, for example. It is stated as "110 over 72." The ideal is below 120 for the top number (systolic) and under 80 for the bottom (diastolic). Write down your blood pressure today. You should pay close attention to your blood pressure if you have  certain conditions such as:  Heart failure.  Prior heart attack.  Diabetes  Chronic kidney disease.  Prior stroke.  Multiple risk factors for heart disease. To see if you have HTN, your blood pressure should be measured while you are seated with your arm held at the level of the heart. It should be measured at least twice. A one-time elevated blood pressure reading (especially in the Emergency Department) does not mean that you need treatment. There may be conditions in which the blood pressure is different between your right and left arms. It is important to see your caregiver soon for a recheck. Most people have essential hypertension which means that there is not a specific cause. This type of high blood pressure may be lowered by changing lifestyle factors such as:  Stress.  Smoking.  Lack of exercise.  Excessive weight.  Drug/tobacco/alcohol use.  Eating less salt. Most people do not have symptoms from high blood pressure until it has caused damage to the body. Effective treatment can often prevent, delay or reduce that damage. TREATMENT  When a cause has been identified, treatment for high blood pressure is directed at the cause. There are a large number of medications to treat HTN. These fall into several categories, and your caregiver will help you select the medicines that are best for you. Medications may have side  effects. You should review side effects with your caregiver. If your blood pressure stays high after you have made lifestyle changes or started on medicines,   Your medication(s) may need to be changed.  Other problems may need to be addressed.  Be certain you understand your prescriptions, and know how and when to take your medicine.  Be sure to follow up with your caregiver within the time frame advised (usually within two weeks) to have your blood pressure rechecked and to review your medications.  If you are taking more than one medicine to lower your  blood pressure, make sure you know how and at what times they should be taken. Taking two medicines at the same time can result in blood pressure that is too low. SEEK IMMEDIATE MEDICAL CARE IF:  You develop a severe headache, blurred or changing vision, or confusion.  You have unusual weakness or numbness, or a faint feeling.  You have severe chest or abdominal pain, vomiting, or breathing problems. MAKE SURE YOU:   Understand these instructions.  Will watch your condition.  Will get help right away if you are not doing well or get worse.   Diabetes and Exercise Exercising regularly is important. It is not just about losing weight. It has many health benefits, such as:  Improving your overall fitness, flexibility, and endurance.  Increasing your bone density.  Helping with weight control.  Decreasing your body fat.  Increasing your muscle strength.  Reducing stress and tension.  Improving your overall health. People with diabetes who exercise gain additional benefits because exercise:  Reduces appetite.  Improves the body's use of blood sugar (glucose).  Helps lower or control blood glucose.  Decreases blood pressure.  Helps control blood lipids (such as cholesterol and triglycerides).  Improves the body's use of the hormone insulin by:  Increasing the body's insulin sensitivity.  Reducing the body's insulin needs.  Decreases the risk for heart disease because exercising:  Lowers cholesterol and triglycerides levels.  Increases the levels of good cholesterol (such as high-density lipoproteins [HDL]) in the body.  Lowers blood glucose levels. YOUR ACTIVITY PLAN  Choose an activity that you enjoy and set realistic goals. Your health care provider or diabetes educator can help you make an activity plan that works for you. You can break activities into 2 or 3 sessions throughout the day. Doing so is as good as one long session. Exercise ideas include:  Taking  the dog for a walk.  Taking the stairs instead of the elevator.  Dancing to your favorite song.  Doing your favorite exercise with a friend. RECOMMENDATIONS FOR EXERCISING WITH TYPE 1 OR TYPE 2 DIABETES   Check your blood glucose before exercising. If blood glucose levels are greater than 240 mg/dL, check for urine ketones. Do not exercise if ketones are present.  Avoid injecting insulin into areas of the body that are going to be exercised. For example, avoid injecting insulin into:  The arms when playing tennis.  The legs when jogging.  Keep a record of:  Food intake before and after you exercise.  Expected peak times of insulin action.  Blood glucose levels before and after you exercise.  The type and amount of exercise you have done.  Review your records with your health care provider. Your health care provider will help you to develop guidelines for adjusting food intake and insulin amounts before and after exercising.  If you take insulin or oral hypoglycemic agents, watch for signs and symptoms of  hypoglycemia. They include:  Dizziness.  Shaking.  Sweating.  Chills.  Confusion.  Drink plenty of water while you exercise to prevent dehydration or heat stroke. Body water is lost during exercise and must be replaced.  Talk to your health care provider before starting an exercise program to make sure it is safe for you. Remember, almost any type of activity is better than none.    Cholesterol Cholesterol is a white, waxy, fat-like protein needed by your body in small amounts. The liver makes all the cholesterol you need. It is carried from the liver by the blood through the blood vessels. Deposits (plaque) may build up on blood vessel walls. This makes the arteries narrower and stiffer. Plaque increases the risk for heart attack and stroke. You cannot feel your cholesterol level even if it is very high. The only way to know is by a blood test to check your lipid  (fats) levels. Once you know your cholesterol levels, you should keep a record of the test results. Work with your caregiver to to keep your levels in the desired range. WHAT THE RESULTS MEAN:  Total cholesterol is a rough measure of all the cholesterol in your blood.  LDL is the so-called bad cholesterol. This is the type that deposits cholesterol in the walls of the arteries. You want this level to be low.  HDL is the good cholesterol because it cleans the arteries and carries the LDL away. You want this level to be high.  Triglycerides are fat that the body can either burn for energy or store. High levels are closely linked to heart disease. DESIRED LEVELS:  Total cholesterol below 200.  LDL below 100 for people at risk, below 70 for very high risk.  HDL above 50 is good, above 60 is best.  Triglycerides below 150. HOW TO LOWER YOUR CHOLESTEROL:  Diet.  Choose fish or white meat chicken and [place], roasted or baked. Limit fatty cuts of red meat, fried foods, and processed meats, such as sausage and lunch meat.  Eat lots of fresh fruits and vegetables. Choose whole grains, beans, pasta, potatoes and cereals.  Use only small amounts of olive, corn or canola oils. Avoid butter, mayonnaise, shortening or palm kernel oils. Avoid foods with trans-fats.  Use skim/nonfat milk and low-fat/nonfat yogurt and cheeses. Avoid whole milk, cream, ice cream, egg yolks and cheeses. Healthy desserts include angel food cake, ginger snaps, animal crackers, hard candy, popsicles, and low-fat/nonfat frozen yogurt. Avoid pastries, cakes, pies and cookies.  Exercise.  A regular program helps decrease LDL and raises HDL.  Helps with weight control.  Do things that increase your activity level like gardening, walking, or taking the stairs.  Medication.  May be prescribed by your caregiver to help lowering cholesterol and the risk for heart disease.  You may need medicine even if your levels are  normal if you have several risk factors. HOME CARE INSTRUCTIONS   Follow your diet and exercise programs as suggested by your caregiver.  Take medications as directed.  Have blood work done when your caregiver feels it is necessary. MAKE SURE YOU:   Understand these instructions.  Will watch your condition.  Will get help right away if you are not doing well or get worse.      Vitamin D Deficiency Vitamin D is an important vitamin that your body needs. Having too little of it in your body is called a deficiency. A very bad deficiency can make your bones soft and  can cause a condition called rickets.  Vitamin D is important to your body for different reasons, such as:   It helps your body absorb 2 minerals called calcium and phosphorus.  It helps make your bones healthy.  It may prevent some diseases, such as diabetes and multiple sclerosis.  It helps your muscles and heart. You can get vitamin D in several ways. It is a natural part of some foods. The vitamin is also added to some dairy products and cereals. Some people take vitamin D supplements. Also, your body makes vitamin D when you are in the sun. It changes the sun's rays into a form of the vitamin that your body can use. CAUSES   Not eating enough foods that contain vitamin D.  Not getting enough sunlight.  Having certain digestive system diseases that make it hard to absorb vitamin D. These diseases include Crohn's disease, chronic pancreatitis, and cystic fibrosis.  Having a surgery in which part of the stomach or small intestine is removed.  Being obese. Fat cells pull vitamin D out of your blood. That means that obese people may not have enough vitamin D left in their blood and in other body tissues.  Having chronic kidney or liver disease. RISK FACTORS Risk factors are things that make you more likely to develop a vitamin D deficiency. They include:  Being older.  Not being able to get outside very  much.  Living in a nursing home.  Having had broken bones.  Having weak or thin bones (osteoporosis).  Having a disease or condition that changes how your body absorbs vitamin D.  Having dark skin.  Some medicines such as seizure medicines or steroids.  Being overweight or obese. SYMPTOMS Mild cases of vitamin D deficiency may not have any symptoms. If you have a very bad case, symptoms may include:  Bone pain.  Muscle pain.  Falling often.  Broken bones caused by a minor injury, due to osteoporosis. DIAGNOSIS A blood test is the best way to tell if you have a vitamin D deficiency. TREATMENT Vitamin D deficiency can be treated in different ways. Treatment for vitamin D deficiency depends on what is causing it. Options include:  Taking vitamin D supplements.  Taking a calcium supplement. Your caregiver will suggest what dose is best for you. HOME CARE INSTRUCTIONS  Take any supplements that your caregiver prescribes. Follow the directions carefully. Take only the suggested amount.  Have your blood tested 2 months after you start taking supplements.  Eat foods that contain vitamin D. Healthy choices include:  Fortified dairy products, cereals, or juices. Fortified means vitamin D has been added to the food. Check the label on the package to be sure.  Fatty fish like salmon or trout.  Eggs.  Oysters.  Do not use a tanning bed.  Keep your weight at a healthy level. Lose weight if you need to.  Keep all follow-up appointments. Your caregiver will need to perform blood tests to make sure your vitamin D deficiency is going away. SEEK MEDICAL CARE IF:  You have any questions about your treatment.  You continue to have symptoms of vitamin D deficiency.  You have nausea or vomiting.  You are constipated.  You feel confused.  You have severe abdominal or back pain. MAKE SURE YOU:  Understand these instructions.  Will watch your condition.  Will get help  right away if you are not doing well or get worse. Preventing Toenail Fungus from Recurring   Sanitize  your shoes with Mycomist spray or a similar shoe sanitizer spray.  Follow the instructions on the bottle and dry them outside in the sun or with a hairdryer.  We also recommend repeating the sanitization once weekly in shoes you wear most often.   Throw away any shoes you have worn a significant amount without socks-fungus thrives in a warm moist environment and you want to avoid re-infection after your laser procedure   Bleach your socks with regular or color safe bleach   Change your socks regularly to keep your feet clean and dry (especially if you have sweaty feet)-if sweaty feet are a problem, let your doctor know-there is a great lotion that helps with this problem.   Clean your toenail clippers with alcohol before you use them if you do your own toenails and make sure to replace [employer] boards and orange sticks regularly   If you get regular pedicures, bring your own instruments or go to a spa that sterilizes their instruments in an autoclave.

## 2013-06-23 ENCOUNTER — Ambulatory Visit (INDEPENDENT_AMBULATORY_CARE_PROVIDER_SITE_OTHER): Payer: 59 | Admitting: Internal Medicine

## 2013-06-23 ENCOUNTER — Encounter: Payer: Self-pay | Admitting: Internal Medicine

## 2013-06-23 VITALS — BP 146/82 | HR 80 | Temp 99.0°F | Resp 16 | Ht 71.0 in | Wt 281.0 lb

## 2013-06-23 DIAGNOSIS — R7401 Elevation of levels of liver transaminase levels: Secondary | ICD-10-CM

## 2013-06-23 DIAGNOSIS — Z111 Encounter for screening for respiratory tuberculosis: Secondary | ICD-10-CM

## 2013-06-23 DIAGNOSIS — Z1212 Encounter for screening for malignant neoplasm of rectum: Secondary | ICD-10-CM

## 2013-06-23 DIAGNOSIS — Z113 Encounter for screening for infections with a predominantly sexual mode of transmission: Secondary | ICD-10-CM

## 2013-06-23 DIAGNOSIS — R7309 Other abnormal glucose: Secondary | ICD-10-CM

## 2013-06-23 DIAGNOSIS — E559 Vitamin D deficiency, unspecified: Secondary | ICD-10-CM

## 2013-06-23 DIAGNOSIS — E669 Obesity, unspecified: Secondary | ICD-10-CM

## 2013-06-23 DIAGNOSIS — B351 Tinea unguium: Secondary | ICD-10-CM

## 2013-06-23 DIAGNOSIS — Z0000 Encounter for general adult medical examination without abnormal findings: Secondary | ICD-10-CM

## 2013-06-23 DIAGNOSIS — Z125 Encounter for screening for malignant neoplasm of prostate: Secondary | ICD-10-CM

## 2013-06-23 DIAGNOSIS — R740 Nonspecific elevation of levels of transaminase and lactic acid dehydrogenase [LDH]: Secondary | ICD-10-CM

## 2013-06-23 DIAGNOSIS — I10 Essential (primary) hypertension: Secondary | ICD-10-CM

## 2013-06-23 DIAGNOSIS — R7402 Elevation of levels of lactic acid dehydrogenase (LDH): Secondary | ICD-10-CM

## 2013-06-23 DIAGNOSIS — E291 Testicular hypofunction: Secondary | ICD-10-CM

## 2013-06-23 LAB — BASIC METABOLIC PANEL WITH GFR
BUN: 16 mg/dL (ref 6–23)
CALCIUM: 9.8 mg/dL (ref 8.4–10.5)
CO2: 27 mEq/L (ref 19–32)
CREATININE: 1.02 mg/dL (ref 0.50–1.35)
Chloride: 99 mEq/L (ref 96–112)
GFR, EST NON AFRICAN AMERICAN: 84 mL/min
GLUCOSE: 105 mg/dL — AB (ref 70–99)
Potassium: 4.3 mEq/L (ref 3.5–5.3)
Sodium: 136 mEq/L (ref 135–145)

## 2013-06-23 LAB — HEMOGLOBIN A1C
HEMOGLOBIN A1C: 5.6 % (ref <5.7)
MEAN PLASMA GLUCOSE: 114 mg/dL (ref <117)

## 2013-06-23 LAB — CBC WITH DIFFERENTIAL/PLATELET
BASOS ABS: 0 10*3/uL (ref 0.0–0.1)
BASOS PCT: 0 % (ref 0–1)
EOS ABS: 0 10*3/uL (ref 0.0–0.7)
Eosinophils Relative: 0 % (ref 0–5)
HEMATOCRIT: 48.7 % (ref 39.0–52.0)
HEMOGLOBIN: 17.3 g/dL — AB (ref 13.0–17.0)
Lymphocytes Relative: 13 % (ref 12–46)
Lymphs Abs: 1.7 10*3/uL (ref 0.7–4.0)
MCH: 32.3 pg (ref 26.0–34.0)
MCHC: 35.5 g/dL (ref 30.0–36.0)
MCV: 90.9 fL (ref 78.0–100.0)
MONO ABS: 0.8 10*3/uL (ref 0.1–1.0)
MONOS PCT: 6 % (ref 3–12)
Neutro Abs: 10.5 10*3/uL — ABNORMAL HIGH (ref 1.7–7.7)
Neutrophils Relative %: 81 % — ABNORMAL HIGH (ref 43–77)
Platelets: 344 10*3/uL (ref 150–400)
RBC: 5.36 MIL/uL (ref 4.22–5.81)
RDW: 14.4 % (ref 11.5–15.5)
WBC: 13 10*3/uL — ABNORMAL HIGH (ref 4.0–10.5)

## 2013-06-23 LAB — MICROALBUMIN / CREATININE URINE RATIO
CREATININE, URINE: 57.9 mg/dL
MICROALB UR: 0.5 mg/dL (ref 0.00–1.89)
Microalb Creat Ratio: 8.6 mg/g (ref 0.0–30.0)

## 2013-06-23 LAB — HEPATIC FUNCTION PANEL
ALBUMIN: 4.6 g/dL (ref 3.5–5.2)
ALT: 53 U/L (ref 0–53)
AST: 31 U/L (ref 0–37)
Alkaline Phosphatase: 54 U/L (ref 39–117)
Bilirubin, Direct: 0.2 mg/dL (ref 0.0–0.3)
Indirect Bilirubin: 0.7 mg/dL (ref 0.2–1.2)
TOTAL PROTEIN: 7.9 g/dL (ref 6.0–8.3)
Total Bilirubin: 0.9 mg/dL (ref 0.2–1.2)

## 2013-06-23 LAB — LIPID PANEL
CHOLESTEROL: 203 mg/dL — AB (ref 0–200)
HDL: 61 mg/dL (ref >39)
LDL Cholesterol: 124 mg/dL — ABNORMAL HIGH (ref 0–99)
Total CHOL/HDL Ratio: 3.3 Ratio
Triglycerides: 92 mg/dL (ref <150)
VLDL: 18 mg/dL (ref 0–40)

## 2013-06-23 LAB — RPR

## 2013-06-23 LAB — VITAMIN B12: Vitamin B-12: 634 pg/mL (ref 211–911)

## 2013-06-23 LAB — TSH: TSH: 0.807 u[IU]/mL (ref 0.350–4.500)

## 2013-06-23 LAB — HIV ANTIBODY (ROUTINE TESTING W REFLEX): HIV: NONREACTIVE

## 2013-06-23 LAB — HEPATITIS B CORE ANTIBODY, TOTAL: HEP B C TOTAL AB: NONREACTIVE

## 2013-06-23 LAB — URINALYSIS, MICROSCOPIC ONLY
Bacteria, UA: NONE SEEN
CASTS: NONE SEEN
Crystals: NONE SEEN
Squamous Epithelial / LPF: NONE SEEN

## 2013-06-23 LAB — URIC ACID: Uric Acid, Serum: 6 mg/dL (ref 4.0–7.8)

## 2013-06-23 LAB — HEPATITIS B SURFACE ANTIBODY,QUALITATIVE: HEP B S AB: NEGATIVE

## 2013-06-23 LAB — TESTOSTERONE: TESTOSTERONE: 1162.03 ng/dL — AB (ref 300–890)

## 2013-06-23 LAB — HEPATITIS A ANTIBODY, TOTAL: Hep A Total Ab: NONREACTIVE

## 2013-06-23 LAB — MAGNESIUM: MAGNESIUM: 2.1 mg/dL (ref 1.5–2.5)

## 2013-06-23 LAB — HEPATITIS C ANTIBODY: HCV Ab: NEGATIVE

## 2013-06-23 MED ORDER — TERBINAFINE HCL 250 MG PO TABS: 250.0000 mg | ORAL_TABLET | Freq: Every day | ORAL | Status: AC

## 2013-06-23 MED ORDER — TERBINAFINE HCL 250 MG PO TABS: 250.0000 mg | ORAL_TABLET | Freq: Every day | ORAL | Status: DC

## 2013-06-23 MED ORDER — TESTOSTERONE CYPIONATE 200 MG/ML IM SOLN: 200.0000 mg | INTRAMUSCULAR | Status: DC

## 2013-06-23 MED ORDER — PHENTERMINE HCL 37.5 MG PO TABS: 37.5000 mg | ORAL_TABLET | Freq: Every day | ORAL | Status: DC

## 2013-06-24 LAB — PSA: PSA: 0.43 ng/mL (ref <=4.00)

## 2013-06-24 LAB — VITAMIN D 25 HYDROXY (VIT D DEFICIENCY, FRACTURES): VIT D 25 HYDROXY: 99 ng/mL — AB (ref 30–89)

## 2013-06-25 LAB — HEPATITIS B E ANTIBODY: HEPATITIS BE ANTIBODY: NONREACTIVE

## 2013-06-28 LAB — INSULIN, FASTING: INSULIN FASTING, SERUM: 33 u[IU]/mL — AB (ref 3–28)

## 2013-06-30 LAB — TB SKIN TEST
Induration: 0 mm
TB SKIN TEST: NEGATIVE

## 2013-08-25 ENCOUNTER — Other Ambulatory Visit: Payer: Self-pay | Admitting: *Deleted

## 2013-08-25 DIAGNOSIS — E291 Testicular hypofunction: Secondary | ICD-10-CM

## 2013-08-25 MED ORDER — TESTOSTERONE CYPIONATE 200 MG/ML IM SOLN: INTRAMUSCULAR | Status: DC

## 2013-10-06 ENCOUNTER — Ambulatory Visit: Payer: Self-pay | Admitting: Emergency Medicine

## 2013-10-19 ENCOUNTER — Other Ambulatory Visit: Payer: Self-pay | Admitting: Internal Medicine

## 2013-10-20 ENCOUNTER — Ambulatory Visit: Payer: Self-pay | Admitting: Physician Assistant

## 2013-10-28 ENCOUNTER — Encounter: Payer: Self-pay | Admitting: Internal Medicine

## 2013-10-28 ENCOUNTER — Ambulatory Visit: Payer: 59 | Admitting: Internal Medicine

## 2013-10-28 NOTE — Progress Notes (Signed)
Patient ID: Thomas Waller, male   DOB: [birth year], 53 y.o.   MRN: [id number]  R E [facility]

## 2013-12-03 ENCOUNTER — Encounter: Payer: Self-pay | Admitting: Internal Medicine

## 2013-12-15 ENCOUNTER — Other Ambulatory Visit: Payer: Self-pay

## 2013-12-15 MED ORDER — HYDROCHLOROTHIAZIDE 25 MG PO TABS: 25.0000 mg | ORAL_TABLET | Freq: Every day | ORAL | Status: DC

## 2014-01-11 DIAGNOSIS — E669 Obesity, unspecified: Secondary | ICD-10-CM | POA: Insufficient documentation

## 2014-01-11 NOTE — Patient Instructions (Signed)

## 2014-01-11 NOTE — Progress Notes (Addendum)
Patient ID: Thomas Waller, male   DOB: 06-16-1960, 53 y.o.   MRN: [id number]   This very nice 53 y.o.MWM presents for 3 month follow up with Hypertension, Hyperlipidemia, Pre-Diabetes and Vitamin D Deficiency. 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   Patient is treated for HTN & BP has been controlled at home. Today's BP is 142/82. Patient has had no complaints of any cardiac type chest pain, palpitations, dyspnea/orthopnea/PND, dizziness, claudication, or dependent edema.   Hyperlipidemia is controlled with diet & meds. Patient denies myalgias or other med SE's. Last Lipids were at goal - Total Chol 203*; HDL 61; LDL  124*; Trig 92 on 06/23/2013.   Also, the patient has history of Morbid Obesity (BMI 38.46) and consequent PreDiabetes and has had no symptoms of reactive hypoglycemia, diabetic polys, paresthesias or visual blurring.  Last A1c was improved at 5.6% on   06/23/2013.   Patient has low-T and is on replacement with shots (last was 1 week ago) and reports improved sense of well-being on the testosterone replacement therapy. Further, the patient also has history of Vitamin D Deficiency and supplements vitamin D without any suspected side-effects. Last vitamin D was 99 on  06/23/2013.   Medication List   ammonium lactate 12 % lotion  Commonly known as:  LAC-HYDRIN  Apply 1 application topically as needed.     B-D 3CC LUER-LOK SYR 21GX1" 21G X 1" 3 ML Misc  Generic drug:  SYRINGE-NEEDLE (DISP) 3 ML  USE EVERY 2 WEEKS     ENBREL SURECLICK 50 MG/ML injection  Generic drug:  etanercept     ENBREL 50 MG/ML injection  Generic drug:  etanercept  Inject 50 mg into the skin once a week. On sundays     Fish Oil 1200 MG Caps  Take by mouth 2 (two) times daily.     fluocinonide ointment 0.05 %  Commonly known as:  LIDEX     folic acid 1 MG tablet  Commonly known as:  FOLVITE  Take 1 mg by mouth daily.     hydrochlorothiazide 25 MG tablet   Commonly known as:  HYDRODIURIL  Take 1 tablet (25 mg total) by mouth daily.     ibuprofen 200 MG tablet  Commonly known as:  ADVIL,MOTRIN  Take 800 mg by mouth every 6 (six) hours as needed. For pain     multivitamin tablet  Take 1 tablet by mouth daily.     phentermine 37.5 MG tablet  Commonly known as:  ADIPEX-P  Take 1 tablet (37.5 mg total) by mouth daily before breakfast.     predniSONE 5 MG tablet  Commonly known as:  DELTASONE  Take 5 mg by mouth 2 (two) times daily.     predniSONE 10 MG tablet  Commonly known as:  DELTASONE  Take 20 mg by mouth daily.     testosterone cypionate 200 MG/ML injection  Commonly known as:  DEPOTESTOTERONE CYPIONATE  Inject 2 ml IM every 14 days(400 mg)     vitamin B-12 1000 MCG tablet  Commonly known as:  CYANOCOBALAMIN  Take 1,000 mcg by mouth daily.     vitamin C 500 MG tablet  Commonly known as:  ASCORBIC ACID  Take 500 mg by mouth 2 (two) times daily.     VITAMIN D PO  Take 5,000 Units by mouth 2 (two) times daily.     No Known Allergies  PMHx:   Past Medical History  Diagnosis  Date  . Gout   . Hypertension   . Elevated hemoglobin A1c measurement   . Hyperlipidemia   . Arthritis     Reiters Polyarthritis  . Vitamin D deficiency 2013   Immunization History  Administered Date(s) Administered  . PPD Test 06/23/2013   No past surgical history on file.   FHx:    Reviewed / unchanged  SHx:    Reviewed / unchanged  Systems Review:  Constitutional: Denies fever, chills, wt changes, headaches, insomnia, fatigue, night sweats, change in appetite. Eyes: Denies redness, blurred vision, diplopia, discharge, itchy, watery eyes.  ENT: Denies discharge, congestion, post nasal drip, epistaxis, sore throat, earache, hearing loss, dental pain, tinnitus, vertigo, sinus pain, snoring.  CV: Denies chest pain, palpitations, irregular heartbeat, syncope, dyspnea, diaphoresis, orthopnea, PND, claudication or edema. Respiratory:  denies cough, dyspnea, DOE, pleurisy, hoarseness, laryngitis, wheezing.  Gastrointestinal: Denies dysphagia, odynophagia, heartburn, reflux, water brash, abdominal pain or cramps, nausea, vomiting, bloating, diarrhea, constipation, hematemesis, melena, hematochezia  or hemorrhoids. Genitourinary: Denies dysuria, frequency, urgency, nocturia, hesitancy, discharge, hematuria or flank pain. Musculoskeletal: Denies arthralgias, myalgias, stiffness, jt. swelling, pain, limping or strain/sprain.  Skin: Denies pruritus, rash, hives, warts, acne, eczema or change in skin lesion(s). Neuro: No weakness, tremor, incoordination, spasms, paresthesia or pain. Psychiatric: Denies confusion, memory loss or sensory loss. Endo: Denies change in weight, skin or hair change.  Heme/Lymph: No excessive bleeding, bruising or enlarged lymph nodes.  Physical Exam  BP 142/82   Pulse 72  Temp 98.1 F   Resp 16  Ht 5\' 11"    Wt 275 lb 9.6 oz   BMI 38.46  Appears well nourished and in no distress. Eyes: PERRLA, EOMs, conjunctiva no swelling or erythema. Sinuses: No frontal/maxillary tenderness ENT/Mouth: EAC's clear, TM's nl w/o erythema, bulging. Nares clear w/o erythema, swelling, exudates. Oropharynx clear without erythema or exudates. Oral hygiene is good. Tongue normal, non obstructing. Hearing intact.  Neck: Supple. Thyroid nl. Car 2+/2+ without bruits, nodes or JVD. Chest: Respirations nl with BS clear & equal w/o rales, rhonchi, wheezing or stridor.  Cor: Heart sounds normal w/ regular rate and rhythm without sig. murmurs, gallops, clicks, or rubs. Peripheral pulses normal and equal  without edema.  Abdomen: Soft & bowel sounds normal. Non-tender w/o guarding, rebound, hernias, masses, or organomegaly.  Lymphatics: Unremarkable.  Musculoskeletal: Full ROM all peripheral extremities, joint stability, 5/5 strength, and normal gait.  Skin: Warm, dry without exposed rashes, lesions or ecchymosis apparent.   Neuro: Cranial nerves intact, reflexes equal bilaterally. Sensory-motor testing grossly intact. Tendon reflexes grossly intact.  Pysch: Alert & oriented x 3.  Insight and judgement nl & appropriate. No ideations.  Assessment and Plan:  1. Hypertension - Continue monitor blood pressure at home. Continue diet/meds same.  2. Hyperlipidemia - Continue diet/meds, exercise,& lifestyle modifications. Continue monitor periodic cholesterol/liver & renal functions   3. Morbid Obesity (BMI 39.21) & Pre-Diabetes - Continue diet, exercise, lifestyle modifications. Monitor appropriate labs.  4. Vitamin D Deficiency - Continue supplementation.  5. Testosterone Deficiency - continue supplementation   Recommended regular exercise, BP monitoring, weight control, and discussed med and SE's. Recommended labs to assess and monitor clinical status. Further disposition pending results of labs.

## 2014-01-12 ENCOUNTER — Ambulatory Visit (INDEPENDENT_AMBULATORY_CARE_PROVIDER_SITE_OTHER): Payer: 59 | Admitting: Internal Medicine

## 2014-01-12 ENCOUNTER — Encounter: Payer: Self-pay | Admitting: Internal Medicine

## 2014-01-12 VITALS — BP 142/82 | HR 72 | Temp 98.1°F | Resp 16 | Ht 71.0 in | Wt 275.6 lb

## 2014-01-12 DIAGNOSIS — E669 Obesity, unspecified: Secondary | ICD-10-CM

## 2014-01-12 DIAGNOSIS — Z79899 Other long term (current) drug therapy: Secondary | ICD-10-CM

## 2014-01-12 DIAGNOSIS — E559 Vitamin D deficiency, unspecified: Secondary | ICD-10-CM

## 2014-01-12 DIAGNOSIS — E785 Hyperlipidemia, unspecified: Secondary | ICD-10-CM

## 2014-01-12 DIAGNOSIS — E538 Deficiency of other specified B group vitamins: Secondary | ICD-10-CM

## 2014-01-12 DIAGNOSIS — R7309 Other abnormal glucose: Secondary | ICD-10-CM

## 2014-01-12 DIAGNOSIS — M1000 Idiopathic gout, unspecified site: Secondary | ICD-10-CM

## 2014-01-12 DIAGNOSIS — I10 Essential (primary) hypertension: Secondary | ICD-10-CM

## 2014-01-12 DIAGNOSIS — E349 Endocrine disorder, unspecified: Secondary | ICD-10-CM

## 2014-01-12 LAB — CBC WITH DIFFERENTIAL/PLATELET
Basophils Absolute: 0.1 10*3/uL (ref 0.0–0.1)
Basophils Relative: 1 % (ref 0–1)
Eosinophils Absolute: 0.7 10*3/uL (ref 0.0–0.7)
Eosinophils Relative: 8 % — ABNORMAL HIGH (ref 0–5)
HCT: 51.3 % (ref 39.0–52.0)
Hemoglobin: 17.8 g/dL — ABNORMAL HIGH (ref 13.0–17.0)
Lymphocytes Relative: 30 % (ref 12–46)
Lymphs Abs: 2.6 10*3/uL (ref 0.7–4.0)
MCH: 32 pg (ref 26.0–34.0)
MCHC: 34.7 g/dL (ref 30.0–36.0)
MCV: 92.3 fL (ref 78.0–100.0)
MPV: 9.9 fL (ref 9.4–12.4)
Monocytes Absolute: 0.9 10*3/uL (ref 0.1–1.0)
Monocytes Relative: 11 % (ref 3–12)
NEUTROS ABS: 4.3 10*3/uL (ref 1.7–7.7)
NEUTROS PCT: 50 % (ref 43–77)
PLATELETS: 267 10*3/uL (ref 150–400)
RBC: 5.56 MIL/uL (ref 4.22–5.81)
RDW: 13.8 % (ref 11.5–15.5)
WBC: 8.5 10*3/uL (ref 4.0–10.5)

## 2014-01-12 LAB — HEMOGLOBIN A1C
Hgb A1c MFr Bld: 5.6 % (ref <5.7)
Mean Plasma Glucose: 114 mg/dL (ref <117)

## 2014-01-12 MED ORDER — PHENTERMINE HCL 37.5 MG PO TABS: 37.5000 mg | ORAL_TABLET | Freq: Every day | ORAL | Status: DC

## 2014-01-12 MED ORDER — TERBINAFINE HCL 250 MG PO TABS: 250.0000 mg | ORAL_TABLET | Freq: Every day | ORAL | Status: DC

## 2014-01-13 LAB — LIPID PANEL
CHOLESTEROL: 190 mg/dL (ref 0–200)
HDL: 39 mg/dL — ABNORMAL LOW (ref >39)
LDL Cholesterol: 127 mg/dL — ABNORMAL HIGH (ref 0–99)
Total CHOL/HDL Ratio: 4.9 Ratio
Triglycerides: 120 mg/dL (ref <150)
VLDL: 24 mg/dL (ref 0–40)

## 2014-01-13 LAB — HEPATIC FUNCTION PANEL
ALK PHOS: 55 U/L (ref 39–117)
ALT: 61 U/L — ABNORMAL HIGH (ref 0–53)
AST: 33 U/L (ref 0–37)
Albumin: 4.3 g/dL (ref 3.5–5.2)
BILIRUBIN DIRECT: 0.2 mg/dL (ref 0.0–0.3)
BILIRUBIN INDIRECT: 0.6 mg/dL (ref 0.2–1.2)
BILIRUBIN TOTAL: 0.8 mg/dL (ref 0.2–1.2)
Total Protein: 7.2 g/dL (ref 6.0–8.3)

## 2014-01-13 LAB — URIC ACID: URIC ACID, SERUM: 5.4 mg/dL (ref 4.0–7.8)

## 2014-01-13 LAB — BASIC METABOLIC PANEL WITH GFR
BUN: 15 mg/dL (ref 6–23)
CHLORIDE: 98 meq/L (ref 96–112)
CO2: 28 meq/L (ref 19–32)
Calcium: 9.5 mg/dL (ref 8.4–10.5)
Creat: 1.12 mg/dL (ref 0.50–1.35)
GFR, Est African American: 86 mL/min
GFR, Est Non African American: 75 mL/min
Glucose, Bld: 142 mg/dL — ABNORMAL HIGH (ref 70–99)
Potassium: 4.5 mEq/L (ref 3.5–5.3)
Sodium: 139 mEq/L (ref 135–145)

## 2014-01-13 LAB — TSH: TSH: 1.833 u[IU]/mL (ref 0.350–4.500)

## 2014-01-13 LAB — INSULIN, FASTING: INSULIN FASTING, SERUM: 103.8 u[IU]/mL — AB (ref 2.0–19.6)

## 2014-01-13 LAB — VITAMIN D 25 HYDROXY (VIT D DEFICIENCY, FRACTURES): Vit D, 25-Hydroxy: 69 ng/mL (ref 30–100)

## 2014-01-13 LAB — MAGNESIUM: Magnesium: 1.9 mg/dL (ref 1.5–2.5)

## 2014-01-13 LAB — VITAMIN B12: Vitamin B-12: 636 pg/mL (ref 211–911)

## 2014-01-13 LAB — TESTOSTERONE: Testosterone: 1442 ng/dL — ABNORMAL HIGH (ref 300–890)

## 2014-04-05 ENCOUNTER — Other Ambulatory Visit: Payer: Self-pay | Admitting: Internal Medicine

## 2014-04-20 ENCOUNTER — Ambulatory Visit: Payer: Self-pay | Admitting: Physician Assistant

## 2014-04-27 ENCOUNTER — Encounter: Payer: Self-pay | Admitting: Internal Medicine

## 2014-04-27 ENCOUNTER — Ambulatory Visit: Payer: Self-pay | Admitting: Physician Assistant

## 2014-04-27 ENCOUNTER — Ambulatory Visit (INDEPENDENT_AMBULATORY_CARE_PROVIDER_SITE_OTHER): Payer: 59 | Admitting: Internal Medicine

## 2014-04-27 VITALS — BP 138/86 | HR 88 | Temp 98.0°F | Resp 18 | Ht 71.0 in | Wt 273.0 lb

## 2014-04-27 DIAGNOSIS — J069 Acute upper respiratory infection, unspecified: Secondary | ICD-10-CM

## 2014-04-27 MED ORDER — AZITHROMYCIN 250 MG PO TABS: ORAL_TABLET | ORAL | Status: DC

## 2014-04-27 MED ORDER — MOMETASONE FUROATE 50 MCG/ACT NA SUSP: 2.0000 | Freq: Every day | NASAL | Status: DC

## 2014-04-27 MED ORDER — PREDNISONE 20 MG PO TABS: ORAL_TABLET | ORAL | Status: DC

## 2014-04-27 NOTE — Progress Notes (Signed)
Patient ID: Thomas Waller, male   DOB: [birth year], 54 y.o.   MRN: [id number]  HPI  Patient presents to the office for evaluation of cough.  It has been going on for 3 days.  Patient reports that he has cough day > night, wet, worse with lying down.  They also endorse change in voice, postnasal drip, shortness of breath and nasal congestion, sinus pressure, bilateral ear clogging and mild pain, sore throat..  They have tried benadryl and nyquin.  They report that nothing has worked.  They denies other sick contacts.  Review of Systems  Constitutional: Negative for fever, chills and malaise/fatigue.  HENT: Positive for congestion, ear pain, hearing loss, sore throat and tinnitus. Negative for ear discharge and nosebleeds.   Eyes: Positive for discharge (watery eyes).  Respiratory: Positive for cough. Negative for sputum production, shortness of breath, wheezing and stridor.   Cardiovascular: Negative for chest pain, palpitations and leg swelling.  Skin: Negative.   Neurological: Positive for headaches.    PE:  General:  Alert and non-toxic, WDWN, NAD HEENT: NCAT, PERLA, EOM normal, no occular discharge or erythema.  Nasal mucosal edema with sinus tenderness to palpation.  Oropharynx clear with minimal oropharyngeal edema and erythema.  Mucous membranes moist and pink. Neck:  Cervical adenopathy Chest:  RRR no MRGs.  Lungs clear to auscultation A&P with no wheezes rhonchi or rales.   Abdomen: +BS x 4 quadrants, soft, non-tender, no guarding, rigidity, or rebound. Skin: warm and dry no rash Neuro: A&Ox4, CN II-XII grossly intact  Filed Vitals:   04/27/14 1547  BP: 138/86  Pulse: 88  Temp: 98 F (36.7 C)  Resp: 18   Assessment and Plan:    1. Acute URI  -nasal saline -tylenol or ibuprofen prn -zyrtec - azithromycin (ZITHROMAX) 250 MG tablet; Take 2 tablets (500 mg) on  Day 1,  followed by 1 tablet (250 mg) once daily on Days 2 through 5.  Dispense: 6 each; Refill: 1 -  mometasone (NASONEX) 50 MCG/ACT nasal spray; Place 2 sprays into the nose daily.  Dispense: 17 g; Refill: 2 - predniSONE (DELTASONE) 20 MG tablet; 3 tabs po day one, then 2 tabs daily x 4 days  Dispense: 11 tablet; Refill: 0  Pt to f/u prn.  To return if symptoms worsen

## 2014-04-27 NOTE — Patient Instructions (Signed)
Please drink plenty of water over the next 2 weeks. You can elevate the head of your bed to help with night time congestion.  Please take zyrtec or cetirizine night to help dry up congestion.  Prior to bed time please you 2 sprays of nasonex in each nostril.  Take prednisone daily in the morning to help with congestion and swelling in your nose.  Use saline as often as tolerated to rinse out your sinuses and if you wish you can use mucinex 2 times daily.  If you are not better in 3 days take the zpak.      Upper Respiratory Infection, Adult An upper respiratory infection (URI) is also sometimes known as the common cold. The upper respiratory tract includes the nose, sinuses, throat, trachea, and bronchi. Bronchi are the airways leading to the lungs. Most people improve within 1 week, but symptoms can last up to 2 weeks. A residual cough may last even longer.  CAUSES Many different viruses can infect the tissues lining the upper respiratory tract. The tissues become irritated and inflamed and often become very moist. Mucus production is also common. A cold is contagious. You can easily spread the virus to others by oral contact. This includes kissing, sharing a glass, coughing, or sneezing. Touching your mouth or nose and then touching a surface, which is then touched by another person, can also spread the virus. SYMPTOMS  Symptoms typically develop 1 to 3 days after you come in contact with a cold virus. Symptoms vary from person to person. They may include:  Runny nose.  Sneezing.  Nasal congestion.  Sinus irritation.  Sore throat.  Loss of voice (laryngitis).  Cough.  Fatigue.  Muscle aches.  Loss of appetite.  Headache.  Low-grade fever. DIAGNOSIS  You might diagnose your own cold based on familiar symptoms, since most people get a cold 2 to 3 times a year. Your caregiver can confirm this based on your exam. Most importantly, your caregiver can check that your symptoms are not  due to another disease such as strep throat, sinusitis, pneumonia, asthma, or epiglottitis. Blood tests, throat tests, and X-rays are not necessary to diagnose a common cold, but they may sometimes be helpful in excluding other more serious diseases. Your caregiver will decide if any further tests are required. RISKS AND COMPLICATIONS  You may be at risk for a more severe case of the common cold if you smoke cigarettes, have chronic heart disease (such as heart failure) or lung disease (such as asthma), or if you have a weakened immune system. The very young and very old are also at risk for more serious infections. Bacterial sinusitis, middle ear infections, and bacterial pneumonia can complicate the common cold. The common cold can worsen asthma and chronic obstructive pulmonary disease (COPD). Sometimes, these complications can require emergency medical care and may be life-threatening. PREVENTION  The best way to protect against getting a cold is to practice good hygiene. Avoid oral or hand contact with people with cold symptoms. Wash your hands often if contact occurs. There is no clear evidence that vitamin C, vitamin E, echinacea, or exercise reduces the chance of developing a cold. However, it is always recommended to get plenty of rest and practice good nutrition. TREATMENT  Treatment is directed at relieving symptoms. There is no cure. Antibiotics are not effective, because the infection is caused by a virus, not by bacteria. Treatment may include:  Increased fluid intake. Sports drinks offer valuable electrolytes, sugars, and fluids.  Breathing heated mist or steam (vaporizer or shower).  Eating chicken soup or other clear broths, and maintaining good nutrition.  Getting plenty of rest.  Using gargles or lozenges for comfort.  Controlling fevers with ibuprofen or acetaminophen as directed by your caregiver.  Increasing usage of your inhaler if you have asthma. Zinc gel and zinc  lozenges, taken in the first 24 hours of the common cold, can shorten the duration and lessen the severity of symptoms. Pain medicines may help with fever, muscle aches, and throat pain. A variety of non-prescription medicines are available to treat congestion and runny nose. Your caregiver can make recommendations and may suggest nasal or lung inhalers for other symptoms.  HOME CARE INSTRUCTIONS   Only take over-the-counter or prescription medicines for pain, discomfort, or fever as directed by your caregiver.  Use a warm mist humidifier or inhale steam from a shower to increase air moisture. This may keep secretions moist and make it easier to breathe.  Drink enough water and fluids to keep your urine clear or pale yellow.  Rest as needed.  Return to work when your temperature has returned to normal or as your caregiver advises. You may need to stay home longer to avoid infecting others. You can also use a face mask and careful hand washing to prevent spread of the virus. SEEK MEDICAL CARE IF:   After the first few days, you feel you are getting worse rather than better.  You need your caregiver's advice about medicines to control symptoms.  You develop chills, worsening shortness of breath, or brown or red sputum. These may be signs of pneumonia.  You develop yellow or brown nasal discharge or pain in the face, especially when you bend forward. These may be signs of sinusitis.  You develop a fever, swollen neck glands, pain with swallowing, or white areas in the back of your throat. These may be signs of strep throat. SEEK IMMEDIATE MEDICAL CARE IF:   You have a fever.  You develop severe or persistent headache, ear pain, sinus pain, or chest pain.  You develop wheezing, a prolonged cough, cough up blood, or have a change in your usual mucus (if you have chronic lung disease).  You develop sore muscles or a stiff neck. Document Released: 06/28/2000 Document Revised: 03/27/2011  Document Reviewed: 04/09/2013 [facility] Patient Information 2015 [place], [place]. This information is not intended to replace advice given to you by your health care provider. Make sure you discuss any questions you have with your health care provider.

## 2014-05-04 ENCOUNTER — Other Ambulatory Visit: Payer: Self-pay | Admitting: *Deleted

## 2014-05-04 DIAGNOSIS — J069 Acute upper respiratory infection, unspecified: Secondary | ICD-10-CM

## 2014-05-04 MED ORDER — PREDNISONE 20 MG PO TABS: ORAL_TABLET | ORAL | Status: DC

## 2014-05-04 NOTE — Telephone Encounter (Signed)
Patient called and states he finished Z-pak and chest congestion better, but he has fluid in his eares.  Per Dr Melford Aase, try OTC Sudafed-PE and RX sent in for Prednisone.

## 2014-05-12 ENCOUNTER — Encounter: Payer: Self-pay | Admitting: Internal Medicine

## 2014-05-12 ENCOUNTER — Ambulatory Visit (INDEPENDENT_AMBULATORY_CARE_PROVIDER_SITE_OTHER): Payer: 59 | Admitting: Internal Medicine

## 2014-05-12 VITALS — BP 144/100 | HR 80 | Temp 98.4°F | Resp 18 | Ht 71.0 in | Wt 268.0 lb

## 2014-05-12 DIAGNOSIS — H6591 Unspecified nonsuppurative otitis media, right ear: Secondary | ICD-10-CM

## 2014-05-12 MED ORDER — PREDNISONE 20 MG PO TABS: ORAL_TABLET | ORAL | Status: DC

## 2014-05-12 NOTE — Patient Instructions (Signed)
Please restart zyrtec nightly and you may also use 3-5 days worth of benadryl at night time.    Please restart nasonex at night time 2 sprays per nostril  You may use afrin which is an over the counter medication twice daily for 3 days to help with the ear congestion.  Please do not use for more than 3 days.  You may restart your enbrel on Sunday.    Otalgia The most common reason for this in children is an infection of the middle ear. Pain from the middle ear is usually caused by a build-up of fluid and pressure behind the eardrum. Pain from an earache can be sharp, dull, or burning. The pain may be temporary or constant. The middle ear is connected to the nasal passages by a short narrow tube called the Eustachian tube. The Eustachian tube allows fluid to drain out of the middle ear, and helps keep the pressure in your ear equalized. CAUSES  A cold or allergy can block the Eustachian tube with inflammation and the build-up of secretions. This is especially likely in small children, because their Eustachian tube is shorter and more horizontal. When the Eustachian tube closes, the normal flow of fluid from the middle ear is stopped. Fluid can accumulate and cause stuffiness, pain, hearing loss, and an ear infection if germs start growing in this area. SYMPTOMS  The symptoms of an ear infection may include fever, ear pain, fussiness, increased crying, and irritability. Many children will have temporary and minor hearing loss during and right after an ear infection. Permanent hearing loss is rare, but the risk increases the more infections a child has. Other causes of ear pain include retained water in the outer ear canal from swimming and bathing. Ear pain in adults is less likely to be from an ear infection. Ear pain may be referred from other locations. Referred pain may be from the joint between your jaw and the skull. It may also come from a tooth problem or problems in the neck. Other causes of ear  pain include:  A foreign body in the ear.  Outer ear infection.  Sinus infections.  Impacted ear wax.  Ear injury.  Arthritis of the jaw or TMJ problems.  Middle ear infection.  Tooth infections.  Sore throat with pain to the ears. DIAGNOSIS  Your caregiver can usually make the diagnosis by examining you. Sometimes other special studies, including x-rays and lab work may be necessary. TREATMENT   If antibiotics were prescribed, use them as directed and finish them even if you or your child's symptoms seem to be improved.  Sometimes PE tubes are needed in children. These are little plastic tubes which are put into the eardrum during a simple surgical procedure. They allow fluid to drain easier and allow the pressure in the middle ear to equalize. This helps relieve the ear pain caused by pressure changes. HOME CARE INSTRUCTIONS   Only take over-the-counter or prescription medicines for pain, discomfort, or fever as directed by your caregiver. DO NOT GIVE CHILDREN ASPIRIN because of the association of Reye's Syndrome in children taking aspirin.  Use a cold pack applied to the outer ear for 15-20 minutes, 03-04 times per day or as needed may reduce pain. Do not apply ice directly to the skin. You may cause frost bite.  Over-the-counter ear drops used as directed may be effective. Your caregiver may sometimes prescribe ear drops.  Resting in an upright position may help reduce pressure in the middle  ear and relieve pain.  Ear pain caused by rapidly descending from high altitudes can be relieved by swallowing or chewing gum. Allowing infants to suck on a bottle during airplane travel can help.  Do not smoke in the house or near children. If you are unable to quit smoking, smoke outside.  Control allergies. SEEK IMMEDIATE MEDICAL CARE IF:   You or your child are becoming sicker.  Pain or fever relief is not obtained with medicine.  You or your child's symptoms (pain, fever,  or irritability) do not improve within 24 to 48 hours or as instructed.  Severe pain suddenly stops hurting. This may indicate a ruptured eardrum.  You or your children develop new problems such as severe headaches, stiff neck, difficulty swallowing, or swelling of the face or around the ear. Document Released: 08/20/2003 Document Revised: 03/27/2011 Document Reviewed: 12/25/2007 [facility] Patient Information 2015 [place], [place]. This information is not intended to replace advice given to you by your health care provider. Make sure you discuss any questions you have with your health care provider.  Otitis Media With Effusion Otitis media with effusion is the presence of fluid in the middle ear. This is a common problem in children, which often follows ear infections. It may be present for weeks or longer after the infection. Unlike an acute ear infection, otitis media with effusion refers only to fluid behind the ear drum and not infection. Children with repeated ear and sinus infections and allergy problems are the most likely to get otitis media with effusion. CAUSES  The most frequent cause of the fluid buildup is dysfunction of the eustachian tubes. These are the tubes that drain fluid in the ears to the back of the nose (nasopharynx). SYMPTOMS   The main symptom of this condition is hearing loss. As a result, you or your child may:  Listen to the TV at a loud volume.  Not respond to questions.  Ask "what" often when spoken to.  Mistake or confuse one sound or word for another.  There may be a sensation of fullness or pressure but usually not pain. DIAGNOSIS   Your health care provider will diagnose this condition by examining you or your child's ears.  Your health care provider may test the pressure in you or your child's ear with a tympanometer.  A hearing test may be conducted if the problem persists. TREATMENT   Treatment depends on the duration and the effects of the  effusion.  Antibiotics, decongestants, nose drops, and cortisone-type drugs (tablets or nasal spray) may not be helpful.  Children with persistent ear effusions may have delayed language or behavioral problems. Children at risk for developmental delays in hearing, learning, and speech may require referral to a specialist earlier than children not at risk.  You or your child's health care provider may suggest a referral to an ear, nose, and throat surgeon for treatment. The following may help restore normal hearing:  Drainage of fluid.  Placement of ear tubes (tympanostomy tubes).  Removal of adenoids (adenoidectomy). HOME CARE INSTRUCTIONS   Avoid secondhand smoke.  Infants who are breastfed are less likely to have this condition.  Avoid feeding infants while they are lying flat.  Avoid known environmental allergens.  Avoid people who are sick. SEEK MEDICAL CARE IF:   Hearing is not better in 3 months.  Hearing is worse.  Ear pain.  Drainage from the ear.  Dizziness. MAKE SURE YOU:   Understand these instructions.  Will watch your condition.  Will get help right away if you are not doing well or get worse. Document Released: 02/10/2004 Document Revised: 05/19/2013 Document Reviewed: 07/30/2012 [facility] Patient Information 2015 [place], [place]. This information is not intended to replace advice given to you by your health care provider. Make sure you discuss any questions you have with your health care provider.

## 2014-05-12 NOTE — Progress Notes (Signed)
   Subjective:    Patient ID: Ediberto Sens, male    DOB: 03/09/1960, 54 y.o.   MRN: [id number]  Otalgia  Associated symptoms include hearing loss and a sore throat. Pertinent negatives include no coughing, ear discharge or rhinorrhea.   Patient presents to the office for evaluation of his right ear.  He reports that 2 weeks ago he had a URI and has taken both prednisone and zpak.  He reports that his congestion is gone and he is also feeling better as far as coughing but still feels like his ear still feels congested and has some pressure.  He reports that he still has some sound sensations that are odd.     Review of Systems  Constitutional: Negative for fever, chills and fatigue.  HENT: Positive for ear pain, hearing loss, postnasal drip, sore throat and voice change. Negative for congestion, dental problem, ear discharge, facial swelling, rhinorrhea, sinus pressure and trouble swallowing.   Eyes: Positive for discharge (watery eyes) and itching.  Respiratory: Negative for cough, chest tightness and shortness of breath.        Objective:   Physical Exam  Constitutional: He is oriented to person, place, and time. He appears well-developed and well-nourished. No distress.  HENT:  Head: Normocephalic and atraumatic.  Right Ear: Hearing, external ear and ear canal normal. No drainage or swelling. Tympanic membrane is not injected.  Left Ear: Hearing, tympanic membrane, external ear and ear canal normal. No drainage or swelling. Tympanic membrane is not injected.  Nose: Mucosal edema present.  Mouth/Throat: Uvula is midline, oropharynx is clear and moist and mucous membranes are normal. No trismus in the jaw. No oropharyngeal exudate, posterior oropharyngeal edema or posterior oropharyngeal erythema.  Eyes: Conjunctivae and EOM are normal. Pupils are equal, round, and reactive to light. No scleral icterus.  Neck: Normal range of motion. Neck supple. No JVD present. No thyromegaly present.   Cardiovascular: Normal rate, regular rhythm, normal heart sounds and intact distal pulses.  Exam reveals no gallop and no friction rub.   No murmur heard. Pulmonary/Chest: Effort normal and breath sounds normal. No respiratory distress. He has no wheezes. He has no rales. He exhibits no tenderness.  Musculoskeletal: Normal range of motion.  Lymphadenopathy:    He has no cervical adenopathy.  Neurological: He is alert and oriented to person, place, and time.  Skin: Skin is warm and dry. He is not diaphoretic.  Psychiatric: He has a normal mood and affect. His behavior is normal. Judgment and thought content normal.  Nursing note and vitals reviewed.         Assessment & Plan:    1. Middle ear effusion, right Likely from allergies -restart zyrtec -restart nasonex -3-5 days of benadryl nightly -3 days  Of afrin - predniSONE (DELTASONE) 20 MG tablet; 3 tabs po day one, then 2 tabs daily x 4 days  Dispense: 11 tablet; Refill: 0

## 2014-05-25 ENCOUNTER — Other Ambulatory Visit: Payer: Self-pay | Admitting: Internal Medicine

## 2014-06-01 ENCOUNTER — Ambulatory Visit (INDEPENDENT_AMBULATORY_CARE_PROVIDER_SITE_OTHER): Payer: 59 | Admitting: Physician Assistant

## 2014-06-01 ENCOUNTER — Encounter: Payer: Self-pay | Admitting: Physician Assistant

## 2014-06-01 VITALS — BP 142/82 | HR 76 | Temp 97.9°F | Resp 16 | Ht 71.0 in | Wt 268.0 lb

## 2014-06-01 DIAGNOSIS — R7309 Other abnormal glucose: Secondary | ICD-10-CM

## 2014-06-01 DIAGNOSIS — E559 Vitamin D deficiency, unspecified: Secondary | ICD-10-CM

## 2014-06-01 DIAGNOSIS — I10 Essential (primary) hypertension: Secondary | ICD-10-CM

## 2014-06-01 DIAGNOSIS — Z79899 Other long term (current) drug therapy: Secondary | ICD-10-CM

## 2014-06-01 DIAGNOSIS — E785 Hyperlipidemia, unspecified: Secondary | ICD-10-CM

## 2014-06-01 LAB — CBC WITH DIFFERENTIAL/PLATELET
BASOS ABS: 0.1 10*3/uL (ref 0.0–0.1)
BASOS PCT: 1 % (ref 0–1)
Eosinophils Absolute: 0.5 10*3/uL (ref 0.0–0.7)
Eosinophils Relative: 5 % (ref 0–5)
HEMATOCRIT: 49.5 % (ref 39.0–52.0)
Hemoglobin: 17.1 g/dL — ABNORMAL HIGH (ref 13.0–17.0)
Lymphocytes Relative: 38 % (ref 12–46)
Lymphs Abs: 3.8 10*3/uL (ref 0.7–4.0)
MCH: 31.7 pg (ref 26.0–34.0)
MCHC: 34.5 g/dL (ref 30.0–36.0)
MCV: 91.8 fL (ref 78.0–100.0)
MPV: 9.7 fL (ref 8.6–12.4)
Monocytes Absolute: 1.4 10*3/uL — ABNORMAL HIGH (ref 0.1–1.0)
Monocytes Relative: 14 % — ABNORMAL HIGH (ref 3–12)
NEUTROS PCT: 42 % — AB (ref 43–77)
Neutro Abs: 4.2 10*3/uL (ref 1.7–7.7)
PLATELETS: 300 10*3/uL (ref 150–400)
RBC: 5.39 MIL/uL (ref 4.22–5.81)
RDW: 14.6 % (ref 11.5–15.5)
WBC: 10.1 10*3/uL (ref 4.0–10.5)

## 2014-06-01 LAB — HEMOGLOBIN A1C
Hgb A1c MFr Bld: 5.7 % — ABNORMAL HIGH (ref <5.7)
Mean Plasma Glucose: 117 mg/dL — ABNORMAL HIGH (ref <117)

## 2014-06-01 NOTE — Progress Notes (Signed)
Assessment and Plan:  1. Hypertension -Continue medication, monitor blood pressure at home. Continue DASH diet.  Reminder to go to the ER if any CP, SOB, nausea, dizziness, severe HA, changes vision/speech, left arm numbness and tingling and jaw pain.  2. Cholesterol -Continue diet and exercise. Check cholesterol.   3. Prediabetes  -Continue diet and exercise. Check A1C  4. Vitamin D Def - check level and continue medications.   5. Obesity with co morbidities - long discussion about weight loss, diet, and exercise   Continue diet and meds as discussed. Further disposition pending results of labs. Over 30 minutes of exam, counseling, chart review, and critical decision making was performed  HPI 54 y.o. male  presents for 3 month follow up on hypertension, cholesterol, prediabetes, and vitamin D deficiency.   His blood pressure has been controlled at home, today their BP is BP: (!) 142/82 mmHg  He does not workout. He denies chest pain, shortness of breath, dizziness.  He is not on cholesterol medication and denies myalgias. His cholesterol is at goal. The cholesterol last visit was:   Lab Results  Component Value Date   CHOL 190 01/12/2014   HDL 39* 01/12/2014   LDLCALC 127* 01/12/2014   TRIG 120 01/12/2014   CHOLHDL 4.9 01/12/2014   He has been working on diet and exercise for prediabetes, and denies paresthesia of the feet, polydipsia, polyuria and visual disturbances. Last A1C in the office was:  Lab Results  Component Value Date   HGBA1C 5.6 01/12/2014  Patient is on Vitamin D supplement.   Lab Results  Component Value Date   VD25OH 69 01/12/2014  He follows with Dr. Charlestine Night for gout and reiter's polyarthritis, has had a flare in his left knee/left ankle and has been taking ibuprofen but more recently some prednisone.    BMI is Body mass index is 37.39 kg/(m^2)., he is working on diet and exercise, he has been on phentermine for 6 months. Wt Readings from Last 3  Encounters:  06/01/14 268 lb (121.564 kg)  05/12/14 268 lb (121.564 kg)  04/27/14 273 lb (123.832 kg)      Current Medications:  Current Outpatient Prescriptions on File Prior to Visit  Medication Sig Dispense Refill  . allopurinol (ZYLOPRIM) 300 MG tablet Take 300 mg by mouth 2 (two) times a week. On Friday and Tuesday    . B-D 3CC LUER-LOK SYR 21GX1" 21G X 1" 3 ML MISC USE EVERY 2 WEEKS 5 each PRN  . Cholecalciferol (VITAMIN D PO) Take 5,000 Units by mouth 2 (two) times daily.    Scarlette Shorts SURECLICK 50 MG/ML injection     . fluocinonide ointment (LIDEX) 2.22 %   0  . folic acid (FOLVITE) 1 MG tablet Take 1 mg by mouth daily.    . hydrochlorothiazide (HYDRODIURIL) 25 MG tablet Take 1 tablet (25 mg total) by mouth daily. 90 tablet 4  . ibuprofen (ADVIL,MOTRIN) 200 MG tablet Take 800 mg by mouth every 6 (six) hours as needed. For pain    . mometasone (NASONEX) 50 MCG/ACT nasal spray Place 2 sprays into the nose daily. 17 g 2  . Multiple Vitamin (MULTIVITAMIN) tablet Take 1 tablet by mouth daily.    . Omega-3 Fatty Acids (FISH OIL) 1200 MG CAPS Take by mouth 2 (two) times daily.    . phentermine (ADIPEX-P) 37.5 MG tablet TAKE 1 TABLET BY MOUTH DAILY BEFORE BREAKFAST 30 tablet 3  . predniSONE (DELTASONE) 10 MG tablet Take 20 mg by  mouth daily.  0  . terbinafine (LAMISIL) 250 MG tablet Take 1 tablet (250 mg total) by mouth daily. patient takes 1 tab daily x 1 month and alternates no med the next month. 90 tablet 0  . testosterone cypionate (DEPOTESTOTERONE CYPIONATE) 200 MG/ML injection INJECT 2 MILLILITERS BY MOUTH INTRAMUSCURALY EVERY 14 DAYS 10 mL 0  . vitamin B-12 (CYANOCOBALAMIN) 1000 MCG tablet Take 1,000 mcg by mouth daily.    . vitamin C (ASCORBIC ACID) 500 MG tablet Take 500 mg by mouth 2 (two) times daily.     No current facility-administered medications on file prior to visit.   Medical History:  Past Medical History  Diagnosis Date  . Gout   . Hypertension   . Elevated  hemoglobin A1c measurement   . Hyperlipidemia   . Arthritis     Reiters Polyarthritis  . Vitamin D deficiency 2013   Allergies: No Known Allergies   Review of Systems:  Review of Systems  Constitutional: Positive for malaise/fatigue. Negative for fever, chills, weight loss and diaphoresis.  HENT: Negative.   Eyes: Negative.   Respiratory: Negative.   Cardiovascular: Negative.   Gastrointestinal: Negative.   Genitourinary: Negative.   Musculoskeletal: Negative.   Skin: Negative.   Neurological: Negative.  Negative for weakness.  Endo/Heme/Allergies: Negative.   Psychiatric/Behavioral: Negative.     Family history- Review and unchanged Social history- Review and unchanged Physical Exam: BP 142/82 mmHg  Pulse 76  Temp(Src) 97.9 F (36.6 C)  Resp 16  Ht 5\' 11"  (1.803 m)  Wt 268 lb (121.564 kg)  BMI 37.39 kg/m2 Wt Readings from Last 3 Encounters:  06/01/14 268 lb (121.564 kg)  05/12/14 268 lb (121.564 kg)  04/27/14 273 lb (123.832 kg)   General Appearance: Well nourished, in no apparent distress. Eyes: PERRLA, EOMs, conjunctiva no swelling or erythema Sinuses: No Frontal/maxillary tenderness ENT/Mouth: Ext aud canals clear, TMs without erythema, bulging. No erythema, swelling, or exudate on post pharynx.  Tonsils not swollen or erythematous. Hearing normal.  Neck: Supple, thyroid normal.  Respiratory: Respiratory effort normal, BS equal bilaterally without rales, rhonchi, wheezing or stridor.  Cardio: RRR with no MRGs. Brisk peripheral pulses without edema.  Abdomen: Soft, + BS,  Non tender, no guarding, rebound, hernias, masses. Lymphatics: Non tender without lymphadenopathy.  Musculoskeletal: Full ROM, 5/5 strength, Normal gait Skin: Warm, dry without rashes, lesions, ecchymosis.  Neuro: Cranial nerves intact. Normal muscle tone, no cerebellar symptoms. Psych: Awake and oriented X 3, normal affect, Insight and Judgment appropriate.    Vicie Mutters, PA-C 9:41  AM [facility]

## 2014-06-01 NOTE — Patient Instructions (Signed)
    Bad carbs also include fruit juice, alcohol, and sweet tea. These are empty calories that do not signal to your brain that you are full.   Please remember the good carbs are still carbs which convert into sugar. So please measure them out no more than 1/2-1 cup of rice, oatmeal, pasta, and beans  Veggies are however free foods! Pile them on.   Not all fruit is created equal. Please see the list below, the fruit at the bottom is higher in sugars than the fruit at the top. Please avoid all dried fruits.     We want weight loss that will last so you should lose 1-2 pounds a week.  THAT IS IT! Please pick THREE things a month to change. Once it is a habit check off the item. Then pick another three items off the list to become habits.  If you are already doing a habit on the list GREAT!  Cross that item off! o Don't drink your calories. Ie, alcohol, soda, fruit juice, and sweet tea.  o Drink more water. Drink a glass when you feel hungry or before each meal.  o Eat breakfast - Complex carb and protein (likeDannon light and fit yogurt, oatmeal, fruit, eggs, turkey bacon). o Measure your cereal.  Eat no more than one cup a day. (ie Kashi) o Eat an apple a day. o Add a vegetable a day. o Try a new vegetable a month. o Use Pam! Stop using oil or butter to cook. o Don't finish your plate or use smaller plates. o Share your dessert. o Eat sugar free Jello for dessert or frozen grapes. o Don't eat 2-3 hours before bed. o Switch to whole wheat bread, pasta, and brown rice. o Make healthier choices when you eat out. No fries! o Pick baked chicken, NOT fried. o Don't forget to SLOW DOWN when you eat. It is not going anywhere.  o Take the stairs. o Park far away in the parking lot o Lift soup cans (or weights) for 10 minutes while watching TV. o Walk at work for 10 minutes during break. o Walk outside 1 time a week with your friend, kids, dog, or significant other. o Start a walking group at  church. o Walk the mall as much as you can tolerate.  o Keep a food diary. o Weigh yourself daily. o Walk for 15 minutes 3 days per week. o Cook at home more often and eat out less.  If life happens and you go back to old habits, it is okay.  Just start over. You can do it!   If you experience chest pain, get short of breath, or tired during the exercise, please stop immediately and inform your doctor.  .  

## 2014-06-02 LAB — LIPID PANEL
Cholesterol: 205 mg/dL — ABNORMAL HIGH (ref 0–200)
HDL: 51 mg/dL (ref >=40)
LDL CALC: 119 mg/dL — AB (ref 0–99)
Total CHOL/HDL Ratio: 4 Ratio
Triglycerides: 175 mg/dL — ABNORMAL HIGH (ref <150)
VLDL: 35 mg/dL (ref 0–40)

## 2014-06-02 LAB — BASIC METABOLIC PANEL WITH GFR
BUN: 23 mg/dL (ref 6–23)
CALCIUM: 9.7 mg/dL (ref 8.4–10.5)
CO2: 32 meq/L (ref 19–32)
CREATININE: 1.13 mg/dL (ref 0.50–1.35)
Chloride: 98 mEq/L (ref 96–112)
GFR, EST NON AFRICAN AMERICAN: 74 mL/min
GFR, Est African American: 85 mL/min
Glucose, Bld: 67 mg/dL — ABNORMAL LOW (ref 70–99)
POTASSIUM: 4.3 meq/L (ref 3.5–5.3)
SODIUM: 139 meq/L (ref 135–145)

## 2014-06-02 LAB — VITAMIN D 25 HYDROXY (VIT D DEFICIENCY, FRACTURES): Vit D, 25-Hydroxy: 64 ng/mL (ref 30–100)

## 2014-06-02 LAB — TSH: TSH: 1.963 u[IU]/mL (ref 0.350–4.500)

## 2014-06-02 LAB — MAGNESIUM: MAGNESIUM: 2.1 mg/dL (ref 1.5–2.5)

## 2014-06-02 LAB — INSULIN, FASTING: INSULIN FASTING, SERUM: 19.6 u[IU]/mL (ref 2.0–19.6)

## 2014-06-28 ENCOUNTER — Other Ambulatory Visit: Payer: Self-pay | Admitting: Internal Medicine

## 2014-06-28 DIAGNOSIS — E349 Endocrine disorder, unspecified: Secondary | ICD-10-CM

## 2014-06-29 ENCOUNTER — Encounter: Payer: Self-pay | Admitting: Internal Medicine

## 2014-09-27 ENCOUNTER — Other Ambulatory Visit: Payer: Self-pay | Admitting: Internal Medicine

## 2014-10-04 ENCOUNTER — Other Ambulatory Visit: Payer: Self-pay | Admitting: Internal Medicine

## 2014-10-05 ENCOUNTER — Ambulatory Visit (INDEPENDENT_AMBULATORY_CARE_PROVIDER_SITE_OTHER): Payer: 59 | Admitting: Internal Medicine

## 2014-10-05 ENCOUNTER — Encounter: Payer: Self-pay | Admitting: Internal Medicine

## 2014-10-05 VITALS — BP 142/88 | HR 76 | Temp 97.7°F | Resp 18 | Ht 71.0 in | Wt 268.8 lb

## 2014-10-05 DIAGNOSIS — Z23 Encounter for immunization: Secondary | ICD-10-CM

## 2014-10-05 DIAGNOSIS — I10 Essential (primary) hypertension: Secondary | ICD-10-CM | POA: Diagnosis not present

## 2014-10-05 DIAGNOSIS — Z111 Encounter for screening for respiratory tuberculosis: Secondary | ICD-10-CM

## 2014-10-05 DIAGNOSIS — R6889 Other general symptoms and signs: Secondary | ICD-10-CM

## 2014-10-05 DIAGNOSIS — Z79899 Other long term (current) drug therapy: Secondary | ICD-10-CM

## 2014-10-05 DIAGNOSIS — Z0001 Encounter for general adult medical examination with abnormal findings: Secondary | ICD-10-CM | POA: Diagnosis not present

## 2014-10-05 DIAGNOSIS — Z6837 Body mass index (BMI) 37.0-37.9, adult: Secondary | ICD-10-CM

## 2014-10-05 DIAGNOSIS — Z1212 Encounter for screening for malignant neoplasm of rectum: Secondary | ICD-10-CM

## 2014-10-05 DIAGNOSIS — R5383 Other fatigue: Secondary | ICD-10-CM

## 2014-10-05 DIAGNOSIS — Z13828 Encounter for screening for other musculoskeletal disorder: Secondary | ICD-10-CM

## 2014-10-05 DIAGNOSIS — Z125 Encounter for screening for malignant neoplasm of prostate: Secondary | ICD-10-CM

## 2014-10-05 DIAGNOSIS — E559 Vitamin D deficiency, unspecified: Secondary | ICD-10-CM

## 2014-10-05 DIAGNOSIS — M1000 Idiopathic gout, unspecified site: Secondary | ICD-10-CM

## 2014-10-05 DIAGNOSIS — R7309 Other abnormal glucose: Secondary | ICD-10-CM

## 2014-10-05 DIAGNOSIS — E785 Hyperlipidemia, unspecified: Secondary | ICD-10-CM

## 2014-10-05 NOTE — Progress Notes (Signed)
Patient ID: Thomas Waller, male   DOB: [birth year], 54 y.o.   MRN: [id number]   Preventative Visit, Wellness Exam and comprehensive evaluation and examination   This very nice 54 y.o. MWF presents for Preventative visit and  presents for a Wellness Visit & comprehensive evaluation and management of multiple medical co-morbidities.  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 Patient is followed by Dr Charlestine Night for his Gout & seroNegative RA.   HTN predates since 2008, but treatment wasn't started until 2014. Patient's BP has not been controlled at home.Today's BP: (!) 142/88 mmHg. Patient denies any cardiac symptoms as chest pain, palpitations, shortness of breath, dizziness or ankle swelling.   Patient's hyperlipidemia is controlled with diet and medications. Patient denies myalgias or other medication SE's. Last lipids were not at goal - Cholesterol 205; HDL 51; LDL 119; and sl elevated Triglycerides 175 on 06/01/2014.   Patient has Morbid Obesity  (BMI 37.49) and consequent prediabetes since 2014 with A1c 6.0% and Insulin Resistance with elevated insulin 96 in Jan 2014 and patient denies reactive hypoglycemic symptoms, visual blurring, diabetic polys or paresthesias. Last A1c was 5.7% on 06/01/2014.   Finally, patient has history of Vitamin D Deficiency and last vitamin D was 64 on 06/01/2014.      Medication Sig  . Allopurinol  300 MG tablet Take 300 mg by mouth 2 (two) times a week. On Friday and Tuesday  . VITAMIN D  Take 5,000 Units by mouth 2 (two) times daily.  Scarlette Shorts SURECLICK 50 MG/ML injection   . fluocinonide ointment (LIDEX) 0.05 %   . folic acid  1 MG tablet Take 1 mg by mouth daily.  . hctz 25 MG tablet Take 1 tablet (25 mg total) by mouth daily.  Marland Kitchen ibuprofen 200 MG tablet Take 800 mg by mouth every 6 (six) hours as needed. For pain  . NASONEX  nasal spray Place 2 sprays into the nose daily.   . Multiple Vitamin  Take 1 tablet by mouth daily.  Marland Kitchen FISH OIL 1200 MG  Take by mouth 2 (two) times daily.  . phentermine (ADIPEX-P) 37.5 MG tablet TAKE 1 TABLET BY MOUTH DAILY BEFORE BREAKFAST  . DEPO-TESTOTERONE  200 MG/ML inj INJECT 2 MILLILITERS  INTRAMUSCURALY EVERY 14 DAYS  . vitamin B-12 (CYANOCOBALAMIN) 1000 MCG tablet Take 1,000 mcg by mouth daily.  . vitamin C (ASCORBIC ACID) 500 MG tablet Take 500 mg by mouth 2 (two) times daily.   No Known Allergies   Past Medical History  Diagnosis Date  . Gout   . Hypertension   . Elevated hemoglobin A1c measurement   . Hyperlipidemia   . Arthritis     Reiters Polyarthritis   Health Maintenance  Topic Date Due  . COLONOSCOPY  11/19/2010  . INFLUENZA VACCINE  08/17/2014  . TETANUS/TDAP  10/04/2024  . Hepatitis C Screening  Completed  . HIV Screening  Completed   Immunization History  Administered Date(s) Administered  . PPD Test 06/23/2013, 10/05/2014  . Tdap 10/05/2014   No past surgical history on file. No family history on file. Social History   Social History  . Marital Status: Married    Spouse Name: N/A  . Number of Children: N/A  . Years of Education: N/A   Occupational History  . Not on file.   Social History Main Topics  . Smoking status: Never Smoker   . Smokeless tobacco: Not on file  .  Alcohol Use: Yes     Comment: rarely  . Drug Use: No  . Sexual Activity: Yes    Birth Control/ Protection: None   Other Topics Concern  . Not on file   Social History Narrative    ROS Constitutional: Denies fever, chills, weight loss/gain, headaches, insomnia,  night sweats or change in appetite. Does c/o fatigue. Eyes: Denies redness, blurred vision, diplopia, discharge, itchy or watery eyes.  ENT: Denies discharge, congestion, post nasal drip, epistaxis, sore throat, earache, hearing loss, dental pain, Tinnitus, Vertigo, Sinus pain or snoring.  Cardio: Denies chest pain, palpitations, irregular heartbeat,  syncope, dyspnea, diaphoresis, orthopnea, PND, claudication or edema Respiratory: denies cough, dyspnea, DOE, pleurisy, hoarseness, laryngitis or wheezing.  Gastrointestinal: Denies dysphagia, heartburn, reflux, water brash, pain, cramps, nausea, vomiting, bloating, diarrhea, constipation, hematemesis, melena, hematochezia, jaundice or hemorrhoids Genitourinary: Denies dysuria, frequency, urgency, nocturia, hesitancy, discharge, hematuria or flank pain Musculoskeletal: Denies arthralgia, myalgia, stiffness, Jt. Swelling, pain, limp or strain/sprain. Denies Falls. Skin: Denies puritis, rash, hives, warts, acne, eczema or change in skin lesion Neuro: No weakness, tremor, incoordination, spasms, paresthesia or pain Psychiatric: Denies confusion, memory loss or sensory loss. Denies Depression. Endocrine: Denies change in weight, skin, hair change, nocturia, and paresthesia, diabetic polys, visual blurring or hyper / hypo glycemic episodes.  Heme/Lymph: No excessive bleeding, bruising or enlarged lymph nodes.  Physical Exam  BP 142/88 mmHg  Pulse 76  Temp(Src) 97.7 F (36.5 C)  Resp 18  Ht 5\' 11"  (1.803 m)  Wt 268 lb 12.8 oz (121.927 kg)  BMI 37.51 kg/m2  General Appearance: Over nourished w/central obesity &  in no apparent distress. Eyes: PERRLA, EOMs, conjunctiva no swelling or erythema, normal fundi and vessels. Sinuses: No frontal/maxillary tenderness ENT/Mouth: EACs patent / TMs  nl. Nares clear without erythema, swelling, mucoid exudates. Oral hygiene is good. No erythema, swelling, or exudate. Tongue normal, non-obstructing. Tonsils not swollen or erythematous. Hearing normal.  Neck: Supple, thyroid normal. No bruits, nodes or JVD. Respiratory: Respiratory effort normal.  BS equal and clear bilateral without rales, rhonci, wheezing or stridor. Cardio: Heart sounds are normal with regular rate and rhythm and no murmurs, rubs or gallops. Peripheral pulses are normal and equal bilaterally  without edema. No aortic or femoral bruits. Chest: symmetric with normal excursions and percussion.  Abdomen: Obese soft, with bowel sounds. Nontender, no guarding, rebound, hernias, masses, or organomegaly.  Lymphatics: Non tender without lymphadenopathy.  Genitourinary: No hernias.Testes nl. DRE - prostate nl for age - smooth & firm w/o nodules. Musculoskeletal: Full ROM all peripheral extremities, joint stability, 5/5 strength, and normal gait. Skin: Warm and dry without rashes, lesions, cyanosis, clubbing or  ecchymosis.  Neuro: Cranial nerves intact, reflexes equal bilaterally. Normal muscle tone, no cerebellar symptoms. Sensation intact.  Pysch: Alert and oriented X 3 with normal affect, insight and judgment appropriate.   Assessment and Plan  1. Encounter for general adult medical examination with abnormal findings  - Microalbumin / creatinine urine ratio - EKG 12-Lead - [place], RETROPERITNL ABD,  LTD - POC Hemoccult Bld/Stl  - Vitamin B12 - PSA - Testosterone - Iron and TIBC - Urinalysis, Routine w reflex microscopic  - CBC with Differential/Platelet - BASIC METABOLIC PANEL WITH GFR - Hepatic function panel - Magnesium - Lipid panel - TSH - Hemoglobin A1c - Insulin, random - Vit D  25 hydroxy  - Cyclic citrul peptide antibody, IgG - Uric acid - PPD - Tdap vaccine greater than or equal to 7yo IM  2.  Essential hypertension  - Microalbumin / creatinine urine ratio - EKG 12-Lead - [place], RETROPERITNL ABD,  LTD - TSH  3. Hyperlipidemia  - Lipid panel  4. PreDiabetes  - Hemoglobin A1c - Insulin, random  5. Vitamin D deficiency  - Vit D  25 hydroxy   6. Idiopathic gout  - Uric acid  7. Morbid obesity (BMI 39.21)   8. Screening for rectal cancer  - POC Hemoccult Bld/Stl   9. Prostate cancer screening  - PSA  10. Other fatigue  - Vitamin B12 - Testosterone - Iron and TIBC - TSH  11. Rheumatoid arthritis screen  - Cyclic citrul peptide antibody,  IgG  12. Medication management  - Urinalysis, Routine w reflex microscopic  - CBC with Differential/Platelet - BASIC METABOLIC PANEL WITH GFR - Hepatic function panel - Magnesium  13. Need for prophylactic vaccination with combined diphtheria-tetanus-pertussis (DTP) vaccine  - Tdap vaccine greater than or equal to 7yo IM  14. Screening examination for pulmonary tuberculosis  - PPD  15. BMI 37.0-37.9, adult   Continue prudent diet as discussed, weight control, BP monitoring, regular exercise, and medications as discussed.  Discussed med effects and SE's. Routine screening labs and tests as requested with regular follow-up as recommended.  Over 40 minutes of exam, counseling &  chart review was performed

## 2014-10-05 NOTE — Patient Instructions (Signed)

## 2014-10-06 LAB — BASIC METABOLIC PANEL WITH GFR
BUN: 15 mg/dL (ref 7–25)
CO2: 31 mmol/L (ref 20–31)
CREATININE: 1.11 mg/dL (ref 0.70–1.33)
Calcium: 9.7 mg/dL (ref 8.6–10.3)
Chloride: 101 mmol/L (ref 98–110)
GFR, EST AFRICAN AMERICAN: 87 mL/min (ref >=60)
GFR, Est Non African American: 75 mL/min (ref >=60)
Glucose, Bld: 103 mg/dL — ABNORMAL HIGH (ref 65–99)
POTASSIUM: 4.6 mmol/L (ref 3.5–5.3)
SODIUM: 141 mmol/L (ref 135–146)

## 2014-10-06 LAB — CBC WITH DIFFERENTIAL/PLATELET
BASOS ABS: 0.1 10*3/uL (ref 0.0–0.1)
BASOS PCT: 1 % (ref 0–1)
EOS ABS: 0.6 10*3/uL (ref 0.0–0.7)
EOS PCT: 6 % — AB (ref 0–5)
HCT: 50 % (ref 39.0–52.0)
Hemoglobin: 17.6 g/dL — ABNORMAL HIGH (ref 13.0–17.0)
LYMPHS ABS: 2.8 10*3/uL (ref 0.7–4.0)
Lymphocytes Relative: 30 % (ref 12–46)
MCH: 32.2 pg (ref 26.0–34.0)
MCHC: 35.2 g/dL (ref 30.0–36.0)
MCV: 91.4 fL (ref 78.0–100.0)
MPV: 10 fL (ref 8.6–12.4)
Monocytes Absolute: 0.8 10*3/uL (ref 0.1–1.0)
Monocytes Relative: 9 % (ref 3–12)
NEUTROS PCT: 54 % (ref 43–77)
Neutro Abs: 5 10*3/uL (ref 1.7–7.7)
PLATELETS: 274 10*3/uL (ref 150–400)
RBC: 5.47 MIL/uL (ref 4.22–5.81)
RDW: 14.4 % (ref 11.5–15.5)
WBC: 9.2 10*3/uL (ref 4.0–10.5)

## 2014-10-06 LAB — HEMOGLOBIN A1C
Hgb A1c MFr Bld: 5.4 % (ref <5.7)
Mean Plasma Glucose: 108 mg/dL (ref <117)

## 2014-10-06 LAB — URINALYSIS, ROUTINE W REFLEX MICROSCOPIC
Bilirubin Urine: NEGATIVE
Glucose, UA: NEGATIVE
HGB URINE DIPSTICK: NEGATIVE
KETONES UR: NEGATIVE
LEUKOCYTES UA: NEGATIVE
NITRITE: NEGATIVE
PROTEIN: NEGATIVE
Specific Gravity, Urine: 1.017 (ref 1.001–1.035)
pH: 6.5 (ref 5.0–8.0)

## 2014-10-06 LAB — IRON AND TIBC
%SAT: 37 % (ref 15–60)
Iron: 133 ug/dL (ref 50–180)
TIBC: 362 ug/dL (ref 250–425)
UIBC: 229 ug/dL (ref 125–400)

## 2014-10-06 LAB — TESTOSTERONE: Testosterone: 1693.64 ng/dL — ABNORMAL HIGH (ref 300–890)

## 2014-10-06 LAB — LIPID PANEL
CHOL/HDL RATIO: 4.9 ratio (ref <=5.0)
Cholesterol: 195 mg/dL (ref 125–200)
HDL: 40 mg/dL (ref >=40)
LDL Cholesterol: 117 mg/dL (ref <130)
TRIGLYCERIDES: 188 mg/dL — AB (ref <150)
VLDL: 38 mg/dL — ABNORMAL HIGH (ref <30)

## 2014-10-06 LAB — HEPATIC FUNCTION PANEL
ALBUMIN: 4.5 g/dL (ref 3.6–5.1)
ALK PHOS: 71 U/L (ref 40–115)
ALT: 79 U/L — ABNORMAL HIGH (ref 9–46)
AST: 45 U/L — ABNORMAL HIGH (ref 10–35)
BILIRUBIN TOTAL: 0.9 mg/dL (ref 0.2–1.2)
Bilirubin, Direct: 0.2 mg/dL (ref <=0.2)
Indirect Bilirubin: 0.7 mg/dL (ref 0.2–1.2)
Total Protein: 7 g/dL (ref 6.1–8.1)

## 2014-10-06 LAB — VITAMIN B12: Vitamin B-12: 701 pg/mL (ref 211–911)

## 2014-10-06 LAB — URIC ACID: URIC ACID, SERUM: 6 mg/dL (ref 4.0–7.8)

## 2014-10-06 LAB — PSA: PSA: 0.34 ng/mL (ref <=4.00)

## 2014-10-06 LAB — VITAMIN D 25 HYDROXY (VIT D DEFICIENCY, FRACTURES): VIT D 25 HYDROXY: 80 ng/mL (ref 30–100)

## 2014-10-06 LAB — MICROALBUMIN / CREATININE URINE RATIO
CREATININE, URINE: 119.1 mg/dL
MICROALB UR: 1.6 mg/dL (ref <2.0)
Microalb Creat Ratio: 13.4 mg/g (ref 0.0–30.0)

## 2014-10-06 LAB — INSULIN, RANDOM: INSULIN: 116.6 u[IU]/mL — AB (ref 2.0–19.6)

## 2014-10-06 LAB — MAGNESIUM: MAGNESIUM: 2 mg/dL (ref 1.5–2.5)

## 2014-10-06 LAB — TSH: TSH: 1.994 u[IU]/mL (ref 0.350–4.500)

## 2014-10-07 LAB — CYCLIC CITRUL PEPTIDE ANTIBODY, IGG: Cyclic Citrullin Peptide Ab: <16 Units

## 2014-10-08 LAB — TB SKIN TEST
INDURATION: 0 mm
TB Skin Test: NEGATIVE

## 2014-10-11 ENCOUNTER — Encounter: Payer: Self-pay | Admitting: Internal Medicine

## 2014-10-11 ENCOUNTER — Other Ambulatory Visit: Payer: Self-pay | Admitting: Internal Medicine

## 2014-11-03 ENCOUNTER — Other Ambulatory Visit: Payer: Self-pay | Admitting: Internal Medicine

## 2014-12-07 ENCOUNTER — Other Ambulatory Visit: Payer: Self-pay | Admitting: Internal Medicine

## 2015-01-04 ENCOUNTER — Other Ambulatory Visit: Payer: Self-pay | Admitting: Internal Medicine

## 2015-01-04 DIAGNOSIS — E349 Endocrine disorder, unspecified: Secondary | ICD-10-CM

## 2015-01-25 ENCOUNTER — Ambulatory Visit: Payer: Self-pay | Admitting: Internal Medicine

## 2015-02-07 ENCOUNTER — Other Ambulatory Visit: Payer: Self-pay | Admitting: Internal Medicine

## 2015-02-15 ENCOUNTER — Ambulatory Visit (INDEPENDENT_AMBULATORY_CARE_PROVIDER_SITE_OTHER): Payer: 59 | Admitting: Internal Medicine

## 2015-02-15 ENCOUNTER — Encounter: Payer: Self-pay | Admitting: Internal Medicine

## 2015-02-15 VITALS — BP 158/100 | HR 88 | Temp 98.0°F | Resp 18 | Ht 71.0 in | Wt 267.0 lb

## 2015-02-15 DIAGNOSIS — E785 Hyperlipidemia, unspecified: Secondary | ICD-10-CM | POA: Diagnosis not present

## 2015-02-15 DIAGNOSIS — E349 Endocrine disorder, unspecified: Secondary | ICD-10-CM

## 2015-02-15 DIAGNOSIS — R7309 Other abnormal glucose: Secondary | ICD-10-CM

## 2015-02-15 DIAGNOSIS — E291 Testicular hypofunction: Secondary | ICD-10-CM

## 2015-02-15 DIAGNOSIS — I10 Essential (primary) hypertension: Secondary | ICD-10-CM

## 2015-02-15 DIAGNOSIS — Z79899 Other long term (current) drug therapy: Secondary | ICD-10-CM

## 2015-02-15 LAB — BASIC METABOLIC PANEL WITH GFR
BUN: 18 mg/dL (ref 7–25)
CO2: 28 mmol/L (ref 20–31)
Calcium: 9.7 mg/dL (ref 8.6–10.3)
Chloride: 99 mmol/L (ref 98–110)
Creat: 1.01 mg/dL (ref 0.70–1.33)
GFR, EST NON AFRICAN AMERICAN: 84 mL/min (ref >=60)
Glucose, Bld: 93 mg/dL (ref 65–99)
POTASSIUM: 4.3 mmol/L (ref 3.5–5.3)
SODIUM: 136 mmol/L (ref 135–146)

## 2015-02-15 LAB — CBC WITH DIFFERENTIAL/PLATELET
BASOS ABS: 0.1 10*3/uL (ref 0.0–0.1)
BASOS PCT: 1 % (ref 0–1)
EOS PCT: 7 % — AB (ref 0–5)
Eosinophils Absolute: 0.6 10*3/uL (ref 0.0–0.7)
HEMATOCRIT: 53.4 % — AB (ref 39.0–52.0)
Hemoglobin: 18.1 g/dL — ABNORMAL HIGH (ref 13.0–17.0)
LYMPHS PCT: 32 % (ref 12–46)
Lymphs Abs: 2.9 10*3/uL (ref 0.7–4.0)
MCH: 31.1 pg (ref 26.0–34.0)
MCHC: 33.9 g/dL (ref 30.0–36.0)
MCV: 91.8 fL (ref 78.0–100.0)
MPV: 10.1 fL (ref 8.6–12.4)
Monocytes Absolute: 1.1 10*3/uL — ABNORMAL HIGH (ref 0.1–1.0)
Monocytes Relative: 12 % (ref 3–12)
Neutro Abs: 4.4 10*3/uL (ref 1.7–7.7)
Neutrophils Relative %: 48 % (ref 43–77)
PLATELETS: 281 10*3/uL (ref 150–400)
RBC: 5.82 MIL/uL — AB (ref 4.22–5.81)
RDW: 14.3 % (ref 11.5–15.5)
WBC: 9.2 10*3/uL (ref 4.0–10.5)

## 2015-02-15 LAB — MAGNESIUM: Magnesium: 2 mg/dL (ref 1.5–2.5)

## 2015-02-15 LAB — HEMOGLOBIN A1C
Hgb A1c MFr Bld: 5.5 % (ref <5.7)
Mean Plasma Glucose: 111 mg/dL (ref <117)

## 2015-02-15 LAB — HEPATIC FUNCTION PANEL
ALK PHOS: 61 U/L (ref 40–115)
ALT: 68 U/L — ABNORMAL HIGH (ref 9–46)
AST: 32 U/L (ref 10–35)
Albumin: 4.4 g/dL (ref 3.6–5.1)
BILIRUBIN DIRECT: 0.1 mg/dL (ref <=0.2)
BILIRUBIN INDIRECT: 0.6 mg/dL (ref 0.2–1.2)
BILIRUBIN TOTAL: 0.7 mg/dL (ref 0.2–1.2)
Total Protein: 7.1 g/dL (ref 6.1–8.1)

## 2015-02-15 LAB — LIPID PANEL
CHOL/HDL RATIO: 5.1 ratio — AB (ref <=5.0)
CHOLESTEROL: 190 mg/dL (ref 125–200)
HDL: 37 mg/dL — ABNORMAL LOW (ref >=40)
LDL Cholesterol: 113 mg/dL (ref <130)
TRIGLYCERIDES: 200 mg/dL — AB (ref <150)
VLDL: 40 mg/dL — AB (ref <30)

## 2015-02-15 NOTE — Patient Instructions (Signed)
Please monitor your blood pressure at home.  Your blood pressure today was 152/100.  Your target range is 120-140/60-85.  If you are consistently 150/90 or greater at home please call sooner than 1 week.

## 2015-02-15 NOTE — Progress Notes (Signed)
Patient ID: Thomas Waller, male   DOB: [birth year], 55 y.o.   MRN: [id number]  Assessment and Plan:  Hypertension:  -recheck also elevated at 152/100.  Could be from phentermine -check at home for a week, if elevated stop phentermine and recheck for 1 week.  If still elevated will need meds -Continue medication,  -monitor blood pressure at home.  -Continue DASH diet.   -Reminder to go to the ER if any CP, SOB, nausea, dizziness, severe HA, changes vision/speech, left arm numbness and tingling, and jaw pain.  Cholesterol: -Continue diet and exercise.  -Check cholesterol.   Pre-diabetes: -Continue diet and exercise.  -Check A1C  Vitamin D Def: -check level -continue medications.   Onychomycosis -cont lamisil -LFTs  Testosterone deficiency -testosterone  Continue diet and meds as discussed. Further disposition pending results of labs.  HPI 55 y.o. male  presents for 3 month follow up with hypertension, hyperlipidemia, prediabetes and vitamin D.   His blood pressure has been controlled at home, today their BP is BP: (!) 158/100 mmHg.   He does workout. He denies chest pain, shortness of breath, dizziness.   He is on fish oil but no prescription cholesterol medication and denies myalgias. His cholesterol is at goal. The cholesterol last visit was:   Lab Results  Component Value Date   CHOL 195 10/05/2014   HDL 40 10/05/2014   LDLCALC 117 10/05/2014   TRIG 188* 10/05/2014   CHOLHDL 4.9 10/05/2014     He has been working on diet and exercise for prediabetes, and denies foot ulcerations, hyperglycemia, hypoglycemia , increased appetite, nausea, paresthesia of the feet, polydipsia, polyuria, visual disturbances, vomiting and weight loss. Last A1C in the office was:  Lab Results  Component Value Date   HGBA1C 5.4 10/05/2014    Patient is on Vitamin D supplement.  Lab Results  Component Value Date   VD25OH 14 10/05/2014     He is still taking lamisil.  He reports that  his nails are definitely starting to heal.  He reports that it is helping significantly.    Patients last testosterone injection was 8 days ago.    Current Medications:  Current Outpatient Prescriptions on File Prior to Visit  Medication Sig Dispense Refill  . allopurinol (ZYLOPRIM) 300 MG tablet Take 300 mg by mouth daily.     . B-D 3CC LUER-LOK SYR 21GX1" 21G X 1" 3 ML MISC USE WITH TESTOSTERONE INJECTION EVERY 14 DAYS 10 each 3  . Cholecalciferol (VITAMIN D PO) Take 5,000 Units by mouth 2 (two) times daily.    Scarlette Shorts SURECLICK 50 MG/ML injection     . folic acid (FOLVITE) 1 MG tablet Take 1 mg by mouth daily.    . hydrochlorothiazide (HYDRODIURIL) 25 MG tablet Take 1 tablet (25 mg total) by mouth daily. 90 tablet 4  . ibuprofen (ADVIL,MOTRIN) 200 MG tablet Take 800 mg by mouth every 6 (six) hours as needed. For pain    . Multiple Vitamin (MULTIVITAMIN) tablet Take 1 tablet by mouth daily.    . Omega-3 Fatty Acids (FISH OIL) 1200 MG CAPS Take by mouth 2 (two) times daily.    . phentermine (ADIPEX-P) 37.5 MG tablet take 1 tablet by mouth once daily BEFORE BREAKFAST 30 tablet 5  . terbinafine (LAMISIL) 250 MG tablet take 1 tablet by mouth once daily ALTERNATING MONTHS AS DIRECTED BY DOCTOR 90 tablet 0  . testosterone cypionate (DEPOTESTOSTERONE CYPIONATE) 200 MG/ML injection inject 2 milliliters intramuscularly every 2 WEEKS as directed  10 mL 3  . vitamin B-12 (CYANOCOBALAMIN) 1000 MCG tablet Take 1,000 mcg by mouth daily.    . vitamin C (ASCORBIC ACID) 500 MG tablet Take 500 mg by mouth 2 (two) times daily.     No current facility-administered medications on file prior to visit.    Medical History:  Past Medical History  Diagnosis Date  . Gout   . Hypertension   . Elevated hemoglobin A1c measurement   . Hyperlipidemia   . Arthritis     Reiters Polyarthritis    Allergies: No Known Allergies   Review of Systems:  Review of Systems  Constitutional: Negative for fever, chills  and malaise/fatigue.  HENT: Negative for congestion, ear pain and sore throat.   Eyes: Negative.   Respiratory: Negative for cough, shortness of breath and wheezing.   Cardiovascular: Negative for chest pain, palpitations and leg swelling.  Gastrointestinal: Negative for heartburn, diarrhea, constipation, blood in stool and melena.  Genitourinary: Negative.   Skin: Negative.   Neurological: Negative for dizziness, sensory change, loss of consciousness and headaches.  Psychiatric/Behavioral: Negative for depression. The patient is not nervous/anxious and does not have insomnia.     Family history- Review and unchanged  Social history- Review and unchanged  Physical Exam: BP 158/100 mmHg  Pulse 88  Temp(Src) 98 F (36.7 C) (Temporal)  Resp 18  Ht 5\' 11"  (1.803 m)  Wt 267 lb (121.11 kg)  BMI 37.26 kg/m2 Wt Readings from Last 3 Encounters:  02/15/15 267 lb (121.11 kg)  10/05/14 268 lb 12.8 oz (121.927 kg)  06/01/14 268 lb (121.564 kg)    General Appearance: Well nourished well developed, in no apparent distress. Eyes: PERRLA, EOMs, conjunctiva no swelling or erythema ENT/Mouth: Ear canals normal without obstruction, swelling, erythma, discharge.  TMs normal bilaterally.  Oropharynx moist, clear, without exudate, or postoropharyngeal swelling. Neck: Supple, thyroid normal,no cervical adenopathy  Respiratory: Respiratory effort normal, Breath sounds clear A&P without rhonchi, wheeze, or rale.  No retractions, no accessory usage. Cardio: RRR with no MRGs. Brisk peripheral pulses without edema.  Abdomen: Soft, + BS,  Non tender, no guarding, rebound, hernias, masses. Musculoskeletal: Full ROM, 5/5 strength, Normal gait Skin: Warm, dry without rashes, lesions, ecchymosis.  Neuro: Awake and oriented X 3, Cranial nerves intact. Normal muscle tone, no cerebellar symptoms. Psych: Normal affect, Insight and Judgment appropriate.    Starlyn Skeans, PA-C 10:00 AM [facility]

## 2015-02-16 LAB — TESTOSTERONE: TESTOSTERONE: 917 ng/dL — AB (ref 250–827)

## 2015-03-07 ENCOUNTER — Other Ambulatory Visit: Payer: Self-pay | Admitting: Physician Assistant

## 2015-03-12 ENCOUNTER — Other Ambulatory Visit: Payer: Self-pay | Admitting: Internal Medicine

## 2015-04-19 ENCOUNTER — Ambulatory Visit: Payer: Self-pay | Admitting: Physician Assistant

## 2015-05-03 ENCOUNTER — Ambulatory Visit: Payer: Self-pay | Admitting: Physician Assistant

## 2015-08-14 ENCOUNTER — Other Ambulatory Visit: Payer: Self-pay | Admitting: Internal Medicine

## 2015-10-20 ENCOUNTER — Ambulatory Visit (INDEPENDENT_AMBULATORY_CARE_PROVIDER_SITE_OTHER): Payer: 59 | Admitting: Internal Medicine

## 2015-10-20 ENCOUNTER — Other Ambulatory Visit: Payer: Self-pay | Admitting: *Deleted

## 2015-10-20 ENCOUNTER — Encounter: Payer: Self-pay | Admitting: Internal Medicine

## 2015-10-20 VITALS — BP 154/90 | HR 64 | Temp 97.3°F | Resp 16 | Ht 71.0 in | Wt 254.0 lb

## 2015-10-20 DIAGNOSIS — R5383 Other fatigue: Secondary | ICD-10-CM

## 2015-10-20 DIAGNOSIS — M1000 Idiopathic gout, unspecified site: Secondary | ICD-10-CM

## 2015-10-20 DIAGNOSIS — I10 Essential (primary) hypertension: Secondary | ICD-10-CM | POA: Diagnosis not present

## 2015-10-20 DIAGNOSIS — E349 Endocrine disorder, unspecified: Secondary | ICD-10-CM

## 2015-10-20 DIAGNOSIS — Z136 Encounter for screening for cardiovascular disorders: Secondary | ICD-10-CM

## 2015-10-20 DIAGNOSIS — R7309 Other abnormal glucose: Secondary | ICD-10-CM

## 2015-10-20 DIAGNOSIS — Z79899 Other long term (current) drug therapy: Secondary | ICD-10-CM

## 2015-10-20 DIAGNOSIS — Z125 Encounter for screening for malignant neoplasm of prostate: Secondary | ICD-10-CM

## 2015-10-20 DIAGNOSIS — Z0000 Encounter for general adult medical examination without abnormal findings: Secondary | ICD-10-CM

## 2015-10-20 DIAGNOSIS — Z111 Encounter for screening for respiratory tuberculosis: Secondary | ICD-10-CM

## 2015-10-20 DIAGNOSIS — E782 Mixed hyperlipidemia: Secondary | ICD-10-CM

## 2015-10-20 DIAGNOSIS — E538 Deficiency of other specified B group vitamins: Secondary | ICD-10-CM

## 2015-10-20 DIAGNOSIS — Z0001 Encounter for general adult medical examination with abnormal findings: Secondary | ICD-10-CM

## 2015-10-20 DIAGNOSIS — E559 Vitamin D deficiency, unspecified: Secondary | ICD-10-CM

## 2015-10-20 DIAGNOSIS — B351 Tinea unguium: Secondary | ICD-10-CM

## 2015-10-20 DIAGNOSIS — Z1212 Encounter for screening for malignant neoplasm of rectum: Secondary | ICD-10-CM

## 2015-10-20 LAB — MAGNESIUM: Magnesium: 2.1 mg/dL (ref 1.5–2.5)

## 2015-10-20 LAB — LIPID PANEL
Cholesterol: 191 mg/dL (ref 125–200)
HDL: 35 mg/dL — ABNORMAL LOW (ref >=40)
LDL CALC: 97 mg/dL (ref <130)
TRIGLYCERIDES: 297 mg/dL — AB (ref <150)
Total CHOL/HDL Ratio: 5.5 Ratio — ABNORMAL HIGH (ref <=5.0)
VLDL: 59 mg/dL — ABNORMAL HIGH (ref <30)

## 2015-10-20 LAB — CBC WITH DIFFERENTIAL/PLATELET
BASOS PCT: 1 %
Basophils Absolute: 79 cells/uL (ref 0–200)
EOS PCT: 7 %
Eosinophils Absolute: 553 cells/uL — ABNORMAL HIGH (ref 15–500)
HCT: 47.6 % (ref 38.5–50.0)
Hemoglobin: 16.5 g/dL (ref 13.2–17.1)
LYMPHS PCT: 42 %
Lymphs Abs: 3318 cells/uL (ref 850–3900)
MCH: 31.8 pg (ref 27.0–33.0)
MCHC: 34.7 g/dL (ref 32.0–36.0)
MCV: 91.7 fL (ref 80.0–100.0)
MONOS PCT: 8 %
MPV: 10.4 fL (ref 7.5–12.5)
Monocytes Absolute: 632 cells/uL (ref 200–950)
Neutro Abs: 3318 cells/uL (ref 1500–7800)
Neutrophils Relative %: 42 %
Platelets: 228 10*3/uL (ref 140–400)
RBC: 5.19 MIL/uL (ref 4.20–5.80)
RDW: 14.4 % (ref 11.0–15.0)
WBC: 7.9 10*3/uL (ref 3.8–10.8)

## 2015-10-20 LAB — HEPATIC FUNCTION PANEL
ALBUMIN: 4.6 g/dL (ref 3.6–5.1)
ALK PHOS: 69 U/L (ref 40–115)
ALT: 86 U/L — ABNORMAL HIGH (ref 9–46)
AST: 37 U/L — AB (ref 10–35)
Bilirubin, Direct: 0.1 mg/dL (ref <=0.2)
Indirect Bilirubin: 0.7 mg/dL (ref 0.2–1.2)
TOTAL PROTEIN: 7.4 g/dL (ref 6.1–8.1)
Total Bilirubin: 0.8 mg/dL (ref 0.2–1.2)

## 2015-10-20 LAB — IRON AND TIBC
%SAT: 61 % — AB (ref 15–60)
IRON: 207 ug/dL — AB (ref 50–180)
TIBC: 341 ug/dL (ref 250–425)
UIBC: 134 ug/dL (ref 125–400)

## 2015-10-20 LAB — PSA: PSA: 0.3 ng/mL (ref <=4.0)

## 2015-10-20 LAB — BASIC METABOLIC PANEL WITH GFR
BUN: 19 mg/dL (ref 7–25)
CO2: 30 mmol/L (ref 20–31)
Calcium: 9.6 mg/dL (ref 8.6–10.3)
Chloride: 103 mmol/L (ref 98–110)
Creat: 1.21 mg/dL (ref 0.70–1.33)
GFR, EST AFRICAN AMERICAN: 78 mL/min (ref >=60)
GFR, Est Non African American: 67 mL/min (ref >=60)
GLUCOSE: 73 mg/dL (ref 65–99)
POTASSIUM: 5 mmol/L (ref 3.5–5.3)
Sodium: 140 mmol/L (ref 135–146)

## 2015-10-20 LAB — TSH: TSH: 1.66 m[IU]/L (ref 0.40–4.50)

## 2015-10-20 LAB — VITAMIN B12: VITAMIN B 12: 712 pg/mL (ref 200–1100)

## 2015-10-20 LAB — URIC ACID: Uric Acid, Serum: 4.8 mg/dL (ref 4.0–8.0)

## 2015-10-20 MED ORDER — TERBINAFINE HCL 250 MG PO TABS: ORAL_TABLET | ORAL | 0 refills | Status: DC

## 2015-10-20 MED ORDER — PHENTERMINE HCL 37.5 MG PO TABS: ORAL_TABLET | ORAL | 5 refills | Status: DC

## 2015-10-20 MED ORDER — TESTOSTERONE CYPIONATE 200 MG/ML IM SOLN: INTRAMUSCULAR | 3 refills | Status: DC

## 2015-10-20 MED ORDER — HYDROCHLOROTHIAZIDE 25 MG PO TABS: 25.0000 mg | ORAL_TABLET | Freq: Every day | ORAL | 1 refills | Status: DC

## 2015-10-20 NOTE — Progress Notes (Signed)
[facility]   Unk Pinto, M.D.    [name], P.A.-C      Starlyn Skeans, P.A.-C  [facility]                [address] [place] [id number] Telephone [phone] Telefax [phone] Annual  Screening/Preventative Visit  & Comprehensive Evaluation & Examination     This very nice 55 y.o. MWM presents for a Screening/Preventative Visit & comprehensive evaluation and management of multiple medical co-morbidities.  Patient has been followed for HTN, Prediabetes, Hyperlipidemia, Gout and Testosterone and Vitamin D Deficiency.     HTN predates circa 2008, altho Treatment wasn't started until 2014. Patient's BP has been controlled at home., but today's BP is elevated at 154/90 and rechecked at 142/86. Patient denies any cardiac symptoms as chest pain, palpitations, shortness of breath, dizziness or ankle swelling.     Patient has hx/o low T and is on replacement therapy. Patient's hyperlipidemia is controlled with diet and medications. Patient denies myalgias or other medication SE's. Last lipids were not at goal: Lab Results  Component Value Date   CHOL 190 02/15/2015   HDL 37 (L) 02/15/2015   LDLCALC 113 02/15/2015   TRIG 200 (H) 02/15/2015   CHOLHDL 5.1 (H) 02/15/2015      Patient has Morbid Obesity (BMI 35+) and consequent prediabetes since 2014 with A1c 5.6% and elevated Insulin 139 and 6.0% with Insulin 116 in 2016.  He has lost 14# over the last year with Phentermine.  Patient denies reactive hypoglycemic symptoms, visual blurring, diabetic polys or paresthesias. Last A1c was back at goal: Lab Results  Component Value Date   HGBA1C 5.5 02/15/2015       Patient does have hx/o low Vit B12 in the past. Finally, patient has history of Vitamin D Deficiency of "36" in 2014 and last vitamin D was at goal: Lab Results  Component Value Date   VD25OH 80 10/05/2014   Current Outpatient  Prescriptions on File Prior to Visit  Medication Sig  . allopurinol (ZYLOPRIM) 300 MG tablet Take 300 mg by mouth daily.   . B-D 3CC LUER-LOK SYR 21GX1" 21G X 1" 3 ML MISC USE WITH TESTOSTERONE INJECTION EVERY 14 DAYS  . Cholecalciferol (VITAMIN D PO) Take 5,000 Units by mouth 2 (two) times daily.  Scarlette Shorts SURECLICK 50 MG/ML injection   . folic acid (FOLVITE) 1 MG tablet Take 1 mg by mouth daily.  Marland Kitchen ibuprofen (ADVIL,MOTRIN) 200 MG tablet Take 800 mg by mouth every 6 (six) hours as needed. For pain  . Multiple Vitamin (MULTIVITAMIN) tablet Take 1 tablet by mouth daily.  . Omega-3 Fatty Acids (FISH OIL) 1200 MG CAPS Take by mouth 2 (two) times daily.  . phentermine (ADIPEX-P) 37.5 MG tablet take 1 tablet by mouth once daily BEFORE BREAKFAST  . terbinafine (LAMISIL) 250 MG tablet take 1 tablet by mouth once daily ALTERNATING MONTHS AS DIRECTED BY DOCTOR  . testosterone cypionate (DEPOTESTOSTERONE CYPIONATE) 200 MG/ML injection inject 2 milliliters intramuscularly every 2 WEEKS as directed  . vitamin B-12 (CYANOCOBALAMIN) 1000 MCG tablet Take 1,000 mcg by mouth daily.  . vitamin C (ASCORBIC ACID) 500 MG tablet Take 500 mg by mouth 2 (two) times daily.   No current facility-administered medications on file prior to visit.    No Known Allergies Past Medical History:  Diagnosis Date  .  Arthritis    Reiters Polyarthritis  . Elevated hemoglobin A1c measurement   . Gout   . Hyperlipidemia   . Hypertension    Health Maintenance  Topic Date Due  . COLONOSCOPY  11/19/2010  . INFLUENZA VACCINE  08/17/2015  . TETANUS/TDAP  10/04/2024  . Hepatitis C Screening  Completed  . HIV Screening  Completed   Immunization History  Administered Date(s) Administered  . PPD Test 06/23/2013, 10/05/2014  . Tdap 10/05/2014   No past surgical history on file. No family history on file.  Social History   Social History  . Marital status: Married    Spouse name: N/A  . Number of children: N/A  .  Years of education: N/A   Occupational History  .  Trainer for [employer]   Social History Main Topics  . Smoking status: Never Smoker  . Smokeless tobacco: Not on file  . Alcohol use Yes     Comment: rarely  . Drug use: No  . Sexual activity: Yes    Birth control/ protection: None    ROS Constitutional: Denies fever, chills, weight loss/gain, headaches, insomnia,  night sweats or change in appetite. Does c/o fatigue. Eyes: Denies redness, blurred vision, diplopia, discharge, itchy or watery eyes.  ENT: Denies discharge, congestion, post nasal drip, epistaxis, sore throat, earache, hearing loss, dental pain, Tinnitus, Vertigo, Sinus pain or snoring.  Cardio: Denies chest pain, palpitations, irregular heartbeat, syncope, dyspnea, diaphoresis, orthopnea, PND, claudication or edema Respiratory: denies cough, dyspnea, DOE, pleurisy, hoarseness, laryngitis or wheezing.  Gastrointestinal: Denies dysphagia, heartburn, reflux, water brash, pain, cramps, nausea, vomiting, bloating, diarrhea, constipation, hematemesis, melena, hematochezia, jaundice or hemorrhoids Genitourinary: Denies dysuria, frequency, urgency, nocturia, hesitancy, discharge, hematuria or flank pain Musculoskeletal: Denies arthralgia, myalgia, stiffness, Jt. Swelling, pain, limp or strain/sprain. Denies Falls. Skin: Denies puritis, rash, hives, warts, acne, eczema or change in skin lesion Neuro: No weakness, tremor, incoordination, spasms, paresthesia or pain Psychiatric: Denies confusion, memory loss or sensory loss. Denies Depression. Endocrine: Denies change in weight, skin, hair change, nocturia, and paresthesia, diabetic polys, visual blurring or hyper / hypo glycemic episodes.  Heme/Lymph: No excessive bleeding, bruising or enlarged lymph nodes.  Physical Exam  BP 154/90 - re'ck'd at 142/86  P 64   T 97.3 F    Resp 16   Ht 5\' 11"    Wt 254 lb    BMI 35.43   General Appearance: Over nourished, in no apparent  distress.  Eyes: PERRLA, EOMs, conjunctiva no swelling or erythema, normal fundi and vessels. Sinuses: No frontal/maxillary tenderness ENT/Mouth: EACs patent / TMs  nl. Nares clear without erythema, swelling, mucoid exudates. Oral hygiene is good. No erythema, swelling, or exudate. Tongue normal, non-obstructing. Tonsils not swollen or erythematous. Hearing normal.  Neck: Supple, thyroid normal. No bruits, nodes or JVD. Respiratory: Respiratory effort normal.  BS equal and clear bilateral without rales, rhonci, wheezing or stridor. Cardio: Heart sounds are normal with regular rate and rhythm and no murmurs, rubs or gallops. Peripheral pulses are normal and equal bilaterally without edema. No aortic or femoral bruits. Chest: symmetric with normal excursions and percussion.  Abdomen: Soft, with Nl bowel sounds. Nontender, no guarding, rebound, hernias, masses, or organomegaly.  Lymphatics: Non tender without lymphadenopathy.  Genitourinary: No hernias.Testes nl. DRE - prostate nl for age - smooth & firm w/o nodules. Musculoskeletal: Full ROM all peripheral extremities, joint stability, 5/5 strength, and normal gait. Skin: Warm and dry without rashes, lesions, cyanosis, clubbing or  ecchymosis. (+) thickened toenails.  Neuro: Cranial nerves intact, reflexes equal bilaterally. Normal muscle tone, no cerebellar symptoms. Sensation intact.  Pysch: Alert and oriented X 3 with normal affect, insight and judgment appropriate.   Assessment and Plan  1. Annual Preventative/Screening Exam   2. Essential hypertension  - Microalbumin / creatinine urine ratio - EKG 12-Lead - [place], RETROPERITNL ABD,  LTD - TSH  3. Mixed hyperlipidemia  - EKG 12-Lead - [place], RETROPERITNL ABD,  LTD - Lipid panel - TSH  4. PreDiabetes  - EKG 12-Lead - [place], RETROPERITNL ABD,  LTD - Hemoglobin A1c - Insulin, random  5. Vitamin D deficiency  - VITAMIN D 25 Hydroxy   6. Testosterone deficiency  - Testosterone -  DEPO-ESTOSTERONE  200 MG/ML injection; inject 2 milliliters intramuscularly every 2 WEEKS as directed  Dispense: 10 mL; Refill: 3  7. Idiopathic gout  - Uric acid  8. Screening for rectal cancer  - POC Hemoccult Bld/Stl   9. Prostate cancer screening  - PSA  10. Screening for ischemic heart disease  - EKG 12-Lead  11. Screening for AAA (aortic abdominal aneurysm)  - [place], RETROPERITNL ABD,  LTD  12. Vitamin B 12 deficiency  - Vitamin B12  13. Other fatigue  - Vitamin B12 - Iron and TIBC - Testosterone - CBC with Differential/Platelet - TSH  14. Medication management  - Urinalysis, Routine w reflex microscopic  - CBC with Differential/Platelet - BASIC METABOLIC PANEL WITH GFR - Hepatic function panel - Magnesium  15. Morbid obesity, unspecified obesity type ([place])  - phentermine (ADIPEX-P) 37.5 MG tablet; Take 1/2 to  1 tab once daily BEFORE BREAKFAST  Dispense: 30 tablet; Refill: 5  16. Onychomycosis of toenail  - terbinafine (LAMISIL) 250 MG tablet; take 1 tablet by mouth once daily ALTERNATING MONTHS AS DIRECTED  Dispense: 90 tablet; Refill: 0  17. Screening examination for pulmonary tuberculosis  - PPD      Continue prudent diet as discussed, weight control, BP monitoring, regular exercise, and medications as discussed.  Discussed med effects and SE's. Routine screening labs and tests as requested with regular follow-up as recommended. Over 40 minutes of exam, counseling, chart review and high complex critical decision making was performed

## 2015-10-20 NOTE — Patient Instructions (Signed)

## 2015-10-21 LAB — VITAMIN D 25 HYDROXY (VIT D DEFICIENCY, FRACTURES): Vit D, 25-Hydroxy: 81 ng/mL (ref 30–100)

## 2015-10-21 LAB — HEMOGLOBIN A1C
HEMOGLOBIN A1C: 5.3 % (ref <5.7)
MEAN PLASMA GLUCOSE: 105 mg/dL

## 2015-10-21 LAB — URINALYSIS, ROUTINE W REFLEX MICROSCOPIC
Bilirubin Urine: NEGATIVE
Glucose, UA: NEGATIVE
Hgb urine dipstick: NEGATIVE
KETONES UR: NEGATIVE
Leukocytes, UA: NEGATIVE
NITRITE: NEGATIVE
PROTEIN: NEGATIVE
SPECIFIC GRAVITY, URINE: 1.011 (ref 1.001–1.035)
pH: 7.5 (ref 5.0–8.0)

## 2015-10-21 LAB — MICROALBUMIN / CREATININE URINE RATIO
Creatinine, Urine: 68 mg/dL (ref 20–370)
Microalb, Ur: <0.2 mg/dL

## 2015-10-21 LAB — TESTOSTERONE: TESTOSTERONE: 178 ng/dL — AB (ref 250–827)

## 2015-10-21 LAB — INSULIN, RANDOM: INSULIN: 15.7 u[IU]/mL (ref 2.0–19.6)

## 2015-10-22 LAB — TB SKIN TEST
Induration: 0 mm
TB SKIN TEST: NEGATIVE

## 2015-11-08 ENCOUNTER — Other Ambulatory Visit: Payer: Self-pay | Admitting: *Deleted

## 2015-11-08 DIAGNOSIS — B351 Tinea unguium: Secondary | ICD-10-CM

## 2015-11-08 MED ORDER — TERBINAFINE HCL 250 MG PO TABS: ORAL_TABLET | ORAL | 0 refills | Status: DC

## 2015-11-30 ENCOUNTER — Other Ambulatory Visit: Payer: Self-pay | Admitting: Internal Medicine

## 2016-02-18 ENCOUNTER — Other Ambulatory Visit: Payer: Self-pay

## 2016-02-18 DIAGNOSIS — Z1212 Encounter for screening for malignant neoplasm of rectum: Secondary | ICD-10-CM

## 2016-02-18 LAB — POC HEMOCCULT BLD/STL (HOME/3-CARD/SCREEN)
FECAL OCCULT BLD: NEGATIVE
FECAL OCCULT BLD: NEGATIVE
Fecal Occult Blood, POC: NEGATIVE

## 2016-03-31 ENCOUNTER — Other Ambulatory Visit: Payer: Self-pay | Admitting: Internal Medicine

## 2016-04-27 ENCOUNTER — Other Ambulatory Visit: Payer: Self-pay | Admitting: Internal Medicine

## 2016-04-27 DIAGNOSIS — B351 Tinea unguium: Secondary | ICD-10-CM

## 2016-04-27 NOTE — Telephone Encounter (Signed)
Please call Phentermine 

## 2016-04-28 ENCOUNTER — Other Ambulatory Visit: Payer: Self-pay | Admitting: Internal Medicine

## 2016-04-28 DIAGNOSIS — E349 Endocrine disorder, unspecified: Secondary | ICD-10-CM

## 2016-05-01 ENCOUNTER — Ambulatory Visit: Payer: Self-pay | Admitting: Physician Assistant

## 2016-05-21 NOTE — Progress Notes (Signed)
Assessment and Plan:  1. Hypertension -Continue medication, monitor blood pressure at home. Continue DASH diet.  Reminder to go to the ER if any CP, SOB, nausea, dizziness, severe HA, changes vision/speech, left arm numbness and tingling and jaw pain.  2. Cholesterol -Continue diet and exercise. Check cholesterol.   3. Prediabetes  -Continue diet and exercise. Check A1C  4. Vitamin D Def - check level and continue medications.   5. Obesity with co morbidities - long discussion about weight loss, diet, and exercise - will stop phentermine since has been longer than a year   Continue diet and meds as discussed. Further disposition pending results of labs. Over 30 minutes of exam, counseling, chart review, and critical decision making was performed Future Appointments Date Time Provider [place]  11/27/2016 9:00 AM Unk Pinto, MD [facility] None    HPI 56 y.o. male  presents for 3 month follow up on hypertension, cholesterol, prediabetes, and vitamin D deficiency.   His blood pressure has been controlled at home, today their BP is BP: 132/80  He does not workout. He denies chest pain, shortness of breath, dizziness.  He is not on cholesterol medication and denies myalgias. His cholesterol is at goal. The cholesterol last visit was:   Lab Results  Component Value Date   CHOL 191 10/20/2015   HDL 35 (L) 10/20/2015   LDLCALC 97 10/20/2015   TRIG 297 (H) 10/20/2015   CHOLHDL 5.5 (H) 10/20/2015   He has been working on diet and exercise for prediabetes, and denies paresthesia of the feet, polydipsia, polyuria and visual disturbances. Last A1C in the office was:  Lab Results  Component Value Date   HGBA1C 5.3 10/20/2015  Patient is on Vitamin D supplement.   Lab Results  Component Value Date   VD25OH 53 10/20/2015  He follows with Dr. Dossie Der for gout and reiter's polyarthritis.  BMI is Body mass index is 34.34 kg/m., he is working on diet and exercise, he is still  on phentermine, has been on it for a long time. Has had negative sleep studies in the past.  Wt Readings from Last 3 Encounters:  05/22/16 246 lb 3.2 oz (111.7 kg)  10/20/15 254 lb (115.2 kg)  02/15/15 267 lb (121.1 kg)    Current Medications:  Current Outpatient Prescriptions on File Prior to Visit  Medication Sig Dispense Refill  . allopurinol (ZYLOPRIM) 300 MG tablet Take 300 mg by mouth daily.     . B-D 3CC LUER-LOK SYR 21GX1" 21G X 1" 3 ML MISC USE WITH TESTOSTERONE INJECTION EVERY 14 DAYS 10 each 1  . Cholecalciferol (VITAMIN D PO) Take 5,000 Units by mouth 2 (two) times daily.    Scarlette Shorts SURECLICK 50 MG/ML injection     . fluocinonide ointment (LIDEX) 0.05 %     . folic acid (FOLVITE) 1 MG tablet Take 1 mg by mouth daily.    . hydrochlorothiazide (HYDRODIURIL) 25 MG tablet TAKE 1 TABLET DAILY 90 tablet 1  . Multiple Vitamin (MULTIVITAMIN) tablet Take 1 tablet by mouth daily.    . Omega-3 Fatty Acids (FISH OIL) 1200 MG CAPS Take by mouth 2 (two) times daily.    . phentermine (ADIPEX-P) 37.5 MG tablet take 1/2 to 1 tablet by mouth once daily BEFORE BREAKFAST 30 tablet 5  . testosterone cypionate (DEPOTESTOSTERONE CYPIONATE) 200 MG/ML injection inject 2 milliliters intramuscularly EVERY 2 WEEKS 10 mL 3  . vitamin B-12 (CYANOCOBALAMIN) 1000 MCG tablet Take 1,000 mcg by mouth daily.    Marland Kitchen  vitamin C (ASCORBIC ACID) 500 MG tablet Take 500 mg by mouth 2 (two) times daily.     No current facility-administered medications on file prior to visit.    Medical History:  Past Medical History:  Diagnosis Date  . Arthritis    Reiters Polyarthritis  . Elevated hemoglobin A1c measurement   . Gout   . Hyperlipidemia   . Hypertension    Allergies: No Known Allergies   Review of Systems:  Review of Systems  Constitutional: Positive for malaise/fatigue. Negative for chills, diaphoresis, fever and weight loss.  HENT: Negative.   Eyes: Negative.   Respiratory: Negative.   Cardiovascular:  Negative.   Gastrointestinal: Negative.   Genitourinary: Negative.   Musculoskeletal: Negative.   Skin: Negative.   Neurological: Negative.  Negative for weakness.  Endo/Heme/Allergies: Negative.   Psychiatric/Behavioral: Negative.     Family history- Review and unchanged Social history- Review and unchanged Physical Exam: BP 132/80   Pulse 87   Temp 97.7 F (36.5 C)   Resp 16   Ht 5\' 11"  (1.803 m)   Wt 246 lb 3.2 oz (111.7 kg)   SpO2 99%   BMI 34.34 kg/m  Wt Readings from Last 3 Encounters:  05/22/16 246 lb 3.2 oz (111.7 kg)  10/20/15 254 lb (115.2 kg)  02/15/15 267 lb (121.1 kg)   General Appearance: Well nourished, in no apparent distress. Eyes: PERRLA, EOMs, conjunctiva no swelling or erythema Sinuses: No Frontal/maxillary tenderness ENT/Mouth: Ext aud canals clear, TMs without erythema, bulging. No erythema, swelling, or exudate on post pharynx.  Tonsils not swollen or erythematous. Hearing normal.  Neck: Supple, thyroid normal.  Respiratory: Respiratory effort normal, BS equal bilaterally without rales, rhonchi, wheezing or stridor.  Cardio: RRR with no MRGs. Brisk peripheral pulses without edema.  Abdomen: Soft, + BS,  Non tender, no guarding, rebound, hernias, masses. Lymphatics: Non tender without lymphadenopathy.  Musculoskeletal: Full ROM, 5/5 strength, Normal gait Skin: Warm, dry without rashes, lesions, ecchymosis.  Neuro: Cranial nerves intact. Normal muscle tone, no cerebellar symptoms. Psych: Awake and oriented X 3, normal affect, Insight and Judgment appropriate.    Vicie Mutters, PA-C 11:50 AM [facility]

## 2016-05-22 ENCOUNTER — Encounter: Payer: Self-pay | Admitting: Physician Assistant

## 2016-05-22 ENCOUNTER — Ambulatory Visit (INDEPENDENT_AMBULATORY_CARE_PROVIDER_SITE_OTHER): Payer: 59 | Admitting: Physician Assistant

## 2016-05-22 VITALS — BP 132/80 | HR 87 | Temp 97.7°F | Resp 16 | Ht 71.0 in | Wt 246.2 lb

## 2016-05-22 DIAGNOSIS — R7989 Other specified abnormal findings of blood chemistry: Secondary | ICD-10-CM | POA: Diagnosis not present

## 2016-05-22 DIAGNOSIS — E559 Vitamin D deficiency, unspecified: Secondary | ICD-10-CM | POA: Diagnosis not present

## 2016-05-22 DIAGNOSIS — I10 Essential (primary) hypertension: Secondary | ICD-10-CM

## 2016-05-22 DIAGNOSIS — Z79899 Other long term (current) drug therapy: Secondary | ICD-10-CM | POA: Diagnosis not present

## 2016-05-22 DIAGNOSIS — R7309 Other abnormal glucose: Secondary | ICD-10-CM

## 2016-05-22 DIAGNOSIS — K760 Fatty (change of) liver, not elsewhere classified: Secondary | ICD-10-CM | POA: Insufficient documentation

## 2016-05-22 DIAGNOSIS — E782 Mixed hyperlipidemia: Secondary | ICD-10-CM | POA: Diagnosis not present

## 2016-05-22 DIAGNOSIS — R945 Abnormal results of liver function studies: Secondary | ICD-10-CM

## 2016-05-22 LAB — CBC WITH DIFFERENTIAL/PLATELET
BASOS PCT: 1 %
Basophils Absolute: 94 cells/uL (ref 0–200)
EOS ABS: 752 {cells}/uL — AB (ref 15–500)
Eosinophils Relative: 8 %
HEMATOCRIT: 51 % — AB (ref 38.5–50.0)
HEMOGLOBIN: 17.2 g/dL — AB (ref 13.2–17.1)
LYMPHS PCT: 37 %
Lymphs Abs: 3478 cells/uL (ref 850–3900)
MCH: 31 pg (ref 27.0–33.0)
MCHC: 33.7 g/dL (ref 32.0–36.0)
MCV: 91.9 fL (ref 80.0–100.0)
MONO ABS: 1034 {cells}/uL — AB (ref 200–950)
MPV: 10.7 fL (ref 7.5–12.5)
Monocytes Relative: 11 %
Neutro Abs: 4042 cells/uL (ref 1500–7800)
Neutrophils Relative %: 43 %
Platelets: 249 10*3/uL (ref 140–400)
RBC: 5.55 MIL/uL (ref 4.20–5.80)
RDW: 14.7 % (ref 11.0–15.0)
WBC: 9.4 10*3/uL (ref 3.8–10.8)

## 2016-05-22 LAB — BASIC METABOLIC PANEL WITH GFR
BUN: 13 mg/dL (ref 7–25)
CHLORIDE: 100 mmol/L (ref 98–110)
CO2: 30 mmol/L (ref 20–31)
Calcium: 9.7 mg/dL (ref 8.6–10.3)
Creat: 1.14 mg/dL (ref 0.70–1.33)
GFR, Est African American: 83 mL/min (ref >=60)
GFR, Est Non African American: 72 mL/min (ref >=60)
GLUCOSE: 89 mg/dL (ref 65–99)
POTASSIUM: 4.2 mmol/L (ref 3.5–5.3)
Sodium: 139 mmol/L (ref 135–146)

## 2016-05-22 LAB — LIPID PANEL
Cholesterol: 173 mg/dL (ref <200)
HDL: 44 mg/dL (ref >40)
LDL CALC: 110 mg/dL — AB (ref <100)
Total CHOL/HDL Ratio: 3.9 Ratio (ref <5.0)
Triglycerides: 93 mg/dL (ref <150)
VLDL: 19 mg/dL (ref <30)

## 2016-05-22 LAB — HEPATIC FUNCTION PANEL
ALBUMIN: 4.3 g/dL (ref 3.6–5.1)
ALK PHOS: 69 U/L (ref 40–115)
ALT: 73 U/L — AB (ref 9–46)
AST: 39 U/L — AB (ref 10–35)
BILIRUBIN INDIRECT: 0.4 mg/dL (ref 0.2–1.2)
Bilirubin, Direct: 0.2 mg/dL (ref <=0.2)
TOTAL PROTEIN: 7 g/dL (ref 6.1–8.1)
Total Bilirubin: 0.6 mg/dL (ref 0.2–1.2)

## 2016-05-22 LAB — TSH: TSH: 1.96 mIU/L (ref 0.40–4.50)

## 2016-05-22 NOTE — Patient Instructions (Signed)
Stop the phentermine for at least 3 months, can restart if needed Increase water to 100 oz 120 oz a day, this will help you feel full   Simple math prevails.    1st - exercise does not produce significant weight loss - at best one converts fat into muscle , "bulks up", loses inches, but usually stays "weight neutral"     2nd - think of your body weightas a check book: If you eat more calories than you burn up - you save money or gain weight .... Or if you spend more money than you put in the check book, ie burn up more calories than you eat, then you lose weight     3rd - if you walk or run 1 mile, you burn up 100 calories - you have to burn up 3,500 calories to lose 1 pound, ie you have to walk/run 35 miles to lose 1 measly pound. So if you want to lose 10 #, then you have to walk/run 350 miles, so.... clearly exercise is not the solution.     4. So if you consume 1,500 calories, then you have to burn up the equivalent of 15 miles to stay weight neutral - It also stands to reason that if you consume 1,500 cal/day and don't lose weight, then you must be burning up about 1,500 cals/day to stay weight neutral.     5. If you really want to lose weight, you must cut your calorie intake 300 calories /day and at that rate you should lose about 1 # every 3 days.   6. Please purchase Dr Fara Olden Fuhrman's book(s) "The End of Dieting" & "Eat to Live" . It has some great concepts and recipes.       Fatty Liver Fatty liver is the accumulation of fat in liver cells. It is also called hepatosteatosis or steatohepatitis. It is normal for your liver to contain some fat. If fat is more than 5 to 10% of your liver's weight, you have fatty liver.  There are often no symptoms (problems) for years while damage is still occurring. People often learn about their fatty liver when they have medical tests for other reasons. Fat can damage your liver for years or even decades without causing problems. When it becomes  severe, it can cause fatigue, weight loss, weakness, and confusion. This makes you more likely to develop more serious liver problems. The liver is the largest organ in the body. It does a lot of work and often gives no warning signs when it is sick until late in a disease. The liver has many important jobs including:  Breaking down foods.  Storing vitamins, iron, and other minerals.  Making proteins.  Making bile for food digestion.  Breaking down many products including medications, alcohol and some poisons.  PROGNOSIS  Fatty liver may cause no damage or it can lead to an inflammation of the liver. This is, called steatohepatitis.  Over time the liver may become scarred and hardened. This condition is called cirrhosis. Cirrhosis is serious and may lead to liver failure or cancer. NASH is one of the leading causes of cirrhosis. About 10-20% of Americans have fatty liver and a smaller 2-5% has NASH.  TREATMENT   Weight loss, fat restriction, and exercise in overweight patients produces inconsistent results but is worth trying.  Good control of diabetes may reduce fatty liver.  Eat a balanced, healthy diet.  Increase your physical activity.  There are no medical or surgical  treatments for a fatty liver or NASH, but improving your diet and increasing your exercise may help prevent or reverse some of the damage.

## 2016-05-23 LAB — MAGNESIUM: MAGNESIUM: 2.1 mg/dL (ref 1.5–2.5)

## 2016-05-23 NOTE — Progress Notes (Signed)
Pt aware of lab results & voiced understanding of those results.

## 2016-10-01 ENCOUNTER — Other Ambulatory Visit: Payer: Self-pay | Admitting: Internal Medicine

## 2016-10-15 ENCOUNTER — Other Ambulatory Visit: Payer: Self-pay | Admitting: Internal Medicine

## 2016-11-01 ENCOUNTER — Other Ambulatory Visit: Payer: Self-pay | Admitting: Internal Medicine

## 2016-11-01 NOTE — Telephone Encounter (Signed)
Please call Phentermine 

## 2016-11-16 ENCOUNTER — Encounter: Payer: Self-pay | Admitting: Internal Medicine

## 2016-11-27 ENCOUNTER — Encounter: Payer: Self-pay | Admitting: Internal Medicine

## 2016-12-04 ENCOUNTER — Other Ambulatory Visit: Payer: Self-pay | Admitting: Physician Assistant

## 2016-12-04 ENCOUNTER — Other Ambulatory Visit: Payer: Self-pay | Admitting: Internal Medicine

## 2016-12-04 DIAGNOSIS — E349 Endocrine disorder, unspecified: Secondary | ICD-10-CM

## 2016-12-04 NOTE — Telephone Encounter (Signed)
Test has been called into pharmacy on Nov 19th 2018 by DD

## 2016-12-13 ENCOUNTER — Encounter: Payer: Self-pay | Admitting: Internal Medicine

## 2016-12-13 ENCOUNTER — Ambulatory Visit (INDEPENDENT_AMBULATORY_CARE_PROVIDER_SITE_OTHER): Payer: 59 | Admitting: Internal Medicine

## 2016-12-13 VITALS — BP 140/84 | HR 64 | Temp 97.2°F | Resp 18 | Ht 71.0 in | Wt 239.8 lb

## 2016-12-13 DIAGNOSIS — J0410 Acute tracheitis without obstruction: Secondary | ICD-10-CM

## 2016-12-13 DIAGNOSIS — J029 Acute pharyngitis, unspecified: Secondary | ICD-10-CM | POA: Diagnosis not present

## 2016-12-13 DIAGNOSIS — J45909 Unspecified asthma, uncomplicated: Secondary | ICD-10-CM

## 2016-12-13 DIAGNOSIS — J0140 Acute pansinusitis, unspecified: Secondary | ICD-10-CM | POA: Diagnosis not present

## 2016-12-13 DIAGNOSIS — J040 Acute laryngitis: Secondary | ICD-10-CM

## 2016-12-13 MED ORDER — PREDNISONE 20 MG PO TABS: ORAL_TABLET | ORAL | 0 refills | Status: DC

## 2016-12-13 MED ORDER — LEVOFLOXACIN 500 MG PO TABS: ORAL_TABLET | ORAL | 1 refills | Status: DC

## 2016-12-13 NOTE — Progress Notes (Signed)
  Subjective:    Patient ID: Thomas Waller, male    DOB: Feb 28, 1960, 56 y.o.   MRN: [id number]  HPI    This nice 56 yo MWM with HTN, preDM presents with a 4-5 day prodrome of worsening Headache, sinus pressure and congestion, no drainage, ear fullness sneezing, S/T, cough minimally productive of a thick yellow green sputum and post-tussive wheezes. Denies fever, chills, sweats, dyspnea or rash.   Medication Sig  . allopurinol (ZYLOPRIM) 300 MG tablet Take 300 mg by mouth daily.   . B-D 3CC LUER-LOK SYR 21GX1" 21G X 1" 3 ML MISC USE AS DIRECTED WITH TESTOSTERONE INJECTION EVERY 14 DAYS  . Cholecalciferol (VITAMIN D PO) Take 5,000 Units by mouth 2 (two) times daily.  Scarlette Shorts SURECLICK 50 MG/ML injection   . fluocinonide ointment (LIDEX) 0.05 %   . hydrochlorothiazide (HYDRODIURIL) 25 MG tablet TAKE 1 TABLET DAILY  . Multiple Vitamin (MULTIVITAMIN) tablet Take 1 tablet by mouth daily.  . Omega-3 Fatty Acids (FISH OIL) 1200 MG CAPS Take by mouth 2 (two) times daily.  . phentermine (ADIPEX-P) 37.5 MG tablet TAKE 1/2 TO 1 TABLET BY MOUTH ONCE DAILY BEFORE BREAKFAST  . testosterone cypionate (DEPOTESTOSTERONE CYPIONATE) 200 MG/ML injection INJECT 2 MILLILITERS INTRAMUSCULARLY EVERY 2 WEEKS  . vitamin B-12 (CYANOCOBALAMIN) 1000 MCG tablet Take 1,000 mcg by mouth daily.  . vitamin C (ASCORBIC ACID) 500 MG tablet Take 500 mg by mouth 2 (two) times daily.  . Zinc 50 MG TABS Take 1 tablet by mouth daily.  . folic acid (FOLVITE) 1 MG tablet Take 1 mg by mouth daily.   No facility-administered medications prior to visit.    No Known Allergies Past Medical History:  Diagnosis Date  . Arthritis    Reiters Polyarthritis  . Elevated hemoglobin A1c measurement   . Gout   . Hyperlipidemia   . Hypertension    Review of Systems  10 point systems review negative except as above.    Objective:   Physical Exam  BP 140/84   Pulse 64   Temp (!) 97.2 F (36.2 C)   Resp 18   Ht 5\' 11"  (1.803 m)   Wt  239 lb 12.8 oz (108.8 kg)   BMI 33.45 kg/m   No stridor. No rash. No Cyanosis.  HEENT - Eac's patent. TM's dull /retracted. (+) tender frontomaxillary sinus areas. EOM's full. PERRLA. NasoOroPharynx 1-2 (+) injected w/o exudate.. Neck - supple. Nl Thyroid. Carotids 2+ & No bruits, nodes, JVD Chest - Clear equal BS w/ scattered  Rales, and end post-tussive expiratory wheezes. Cor - Nl HS. RRR w/o sig m. MS- FROM w/o deformities.Gait Nl. Neuro - No obvious Cr N abnormalities. Nl w/o focal abnormalities.    Assessment & Plan:   1. Subacute pansinusitis   2. Pharyngitis, unspecified etiology   3. Laryngitis   4. Tracheitis   5. Mild asthma without complication, unspecified whether persistent  - predniSONE (DELTASONE) 20 MG tablet; 1 tab 3 x day for 3 days, then 1 tab 2 x day for 3 days, then 1 tab 1 x day for 5 days  Dispense: 20 tablet; Refill: 0  - levofloxacin (LEVAQUIN) 500 MG tablet; Take 1 tablet daily with food for infection  Dispense: 15 tablet; Refill: 1  - Recc OTC PSE 120 SR 2 x/ day and Mucus Relief 1200 mg 2 x/ day and "steam"

## 2016-12-27 ENCOUNTER — Encounter: Payer: Self-pay | Admitting: Internal Medicine

## 2016-12-27 ENCOUNTER — Ambulatory Visit (INDEPENDENT_AMBULATORY_CARE_PROVIDER_SITE_OTHER): Payer: 59 | Admitting: Internal Medicine

## 2016-12-27 VITALS — BP 150/96 | HR 92 | Temp 97.7°F | Resp 18 | Ht 70.0 in | Wt 229.8 lb

## 2016-12-27 DIAGNOSIS — E349 Endocrine disorder, unspecified: Secondary | ICD-10-CM

## 2016-12-27 DIAGNOSIS — Z125 Encounter for screening for malignant neoplasm of prostate: Secondary | ICD-10-CM

## 2016-12-27 DIAGNOSIS — E538 Deficiency of other specified B group vitamins: Secondary | ICD-10-CM

## 2016-12-27 DIAGNOSIS — R5383 Other fatigue: Secondary | ICD-10-CM

## 2016-12-27 DIAGNOSIS — Z79899 Other long term (current) drug therapy: Secondary | ICD-10-CM

## 2016-12-27 DIAGNOSIS — E782 Mixed hyperlipidemia: Secondary | ICD-10-CM

## 2016-12-27 DIAGNOSIS — I10 Essential (primary) hypertension: Secondary | ICD-10-CM

## 2016-12-27 DIAGNOSIS — N401 Enlarged prostate with lower urinary tract symptoms: Secondary | ICD-10-CM

## 2016-12-27 DIAGNOSIS — R7309 Other abnormal glucose: Secondary | ICD-10-CM

## 2016-12-27 DIAGNOSIS — M1000 Idiopathic gout, unspecified site: Secondary | ICD-10-CM

## 2016-12-27 DIAGNOSIS — Z111 Encounter for screening for respiratory tuberculosis: Secondary | ICD-10-CM | POA: Diagnosis not present

## 2016-12-27 DIAGNOSIS — Z0001 Encounter for general adult medical examination with abnormal findings: Secondary | ICD-10-CM

## 2016-12-27 DIAGNOSIS — E559 Vitamin D deficiency, unspecified: Secondary | ICD-10-CM

## 2016-12-27 DIAGNOSIS — Z1212 Encounter for screening for malignant neoplasm of rectum: Secondary | ICD-10-CM

## 2016-12-27 DIAGNOSIS — Z0000 Encounter for general adult medical examination without abnormal findings: Secondary | ICD-10-CM

## 2016-12-27 DIAGNOSIS — Z1211 Encounter for screening for malignant neoplasm of colon: Secondary | ICD-10-CM

## 2016-12-27 DIAGNOSIS — B351 Tinea unguium: Secondary | ICD-10-CM

## 2016-12-27 DIAGNOSIS — Z136 Encounter for screening for cardiovascular disorders: Secondary | ICD-10-CM

## 2016-12-27 MED ORDER — LISINOPRIL 20 MG PO TABS: ORAL_TABLET | ORAL | 1 refills | Status: DC

## 2016-12-27 MED ORDER — PHENTERMINE HCL 37.5 MG PO TABS: ORAL_TABLET | ORAL | 5 refills | Status: DC

## 2016-12-27 MED ORDER — BISOPROLOL-HYDROCHLOROTHIAZIDE 5-6.25 MG PO TABS: ORAL_TABLET | ORAL | 1 refills | Status: DC

## 2016-12-27 MED ORDER — FINASTERIDE 5 MG PO TABS: ORAL_TABLET | ORAL | 3 refills | Status: DC

## 2016-12-27 MED ORDER — TERBINAFINE HCL 250 MG PO TABS: ORAL_TABLET | ORAL | 1 refills | Status: DC

## 2016-12-27 NOTE — Progress Notes (Signed)
[facility]   Thomas Waller, M.D.     [facility], P.A.-[facility]                [address] [place] [address] Telephone [phone] Telefax [phone] Annual  Screening/Preventative Visit  & Comprehensive Evaluation & Examination     This very nice 56 y.o. MWM presents for a Screening/Preventative Visit & comprehensive evaluation and management of multiple medical co-morbidities.  Patient has been followed for HTN, Prediabetes, Hyperlipidemia and Vitamin D Deficiency. Patient also has hx/o Gout and is now followed by Dr Dossie Der.     HTN predates since 2008 and treatment was begun in 2014. Patient's BP has been controlled at home.  Today's BP was elevated at 150/96 and rechecked at 164/104. Patient denies any cardiac symptoms as chest pain, palpitations, shortness of breath, dizziness or ankle swelling.     Patient's hyperlipidemia is not controlled with diet and medications. Patient denies myalgias or other medication SE's. Last lipids were not at goal: Lab Results  Component Value Date   CHOL 173 05/22/2016   HDL 44 05/22/2016   LDLCALC 110 (H) 05/22/2016   TRIG 93 05/22/2016   CHOLHDL 3.9 05/22/2016      Patient has hx/o Morbid Obesity (BMI 33) and prediabetes (A1c 5.6% w/ elevated Insulin 139 in 2014 and then A1c 6.0% /Insulin 116 in 2016) and patient denies reactive hypoglycemic symptoms, visual blurring, diabetic polys or paresthesias. He has improved his dietary choice and lost another 24# over the last year adding to the 14# he lost in the previous year for a total 38# over the last 1.5 yr's since using Phentermine. Max weight was 284# in 2013).  As a consequence of weight loss,  his last A1c was normal and at goal: Lab Results  Component Value Date   HGBA1C 5.3 10/20/2015       Finally, patient has history of Vitamin D Deficiency ("36" / 2014) and last  vitamin D was at goal: Lab Results  Component Value Date   VD25OH 81 10/20/2015   Current Outpatient Medications on File Prior to Visit  Medication Sig  . allopurinol (ZYLOPRIM) 300 MG tablet Take 300 mg by mouth daily.   . B-D 3CC LUER-LOK SYR 21GX1" 21G X 1" 3 ML MISC USE AS DIRECTED WITH TESTOSTERONE INJECTION EVERY 14 DAYS  . Cholecalciferol (VITAMIN D PO) Take 5,000 Units by mouth 2 (two) times daily.  Scarlette Shorts SURECLICK 50 MG/ML injection   . fluocinonide ointment (LIDEX) 0.05 %   . hydrochlorothiazide (HYDRODIURIL) 25 MG tablet TAKE 1 TABLET DAILY  . Magnesium 400 MG TABS Take 1 tablet by mouth daily.  . Multiple Vitamin (MULTIVITAMIN) tablet Take 1 tablet by mouth daily.  . Omega-3 Fatty Acids (FISH OIL) 1200 MG CAPS Take by mouth 2 (two) times daily.  . phentermine (ADIPEX-P) 37.5 MG tablet TAKE 1/2 TO 1 TABLET BY MOUTH ONCE DAILY BEFORE BREAKFAST  . testosterone cypionate (DEPOTESTOSTERONE CYPIONATE) 200 MG/ML injection INJECT 2 MILLILITERS INTRAMUSCULARLY EVERY 2 WEEKS  . vitamin B-12 (CYANOCOBALAMIN) 1000 MCG tablet Take 1,000 mcg by mouth daily.  . vitamin C (ASCORBIC ACID) 500 MG tablet Take 500 mg by mouth 2 (two) times daily.  . Zinc 50 MG TABS Take 1 tablet by mouth daily.   No current facility-administered medications on file prior to  visit.    No Known Allergies Past Medical History:  Diagnosis Date  . Arthritis    Reiters Polyarthritis  . Elevated hemoglobin A1c measurement   . Gout   . Hyperlipidemia   . Hypertension    Health Maintenance  Topic Date Due  . COLONOSCOPY - patient agreed to [place] today (ordered)  11/19/2010  . INFLUENZA VACCINE - declined today  08/16/2016  . TETANUS/TDAP  10/04/2024  . Hepatitis C Screening  Completed  . HIV Screening  Completed   Immunization History  Administered Date(s) Administered  . PPD Test 06/23/2013, 10/05/2014, 10/20/2015  . Tdap 10/05/2014   No past surgical history on file. No family history on  file. Social History   Socioeconomic History  . Marital status: Married    Spouse name: Thomas Waller  Occupational History  . 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 Smoking status: Never Smoker  . Smokeless tobacco: Never Used  Substance and Sexual Activity  . Alcohol use: Yes    Comment: rarely  . Drug use: No  . Sexual activity: Yes    Birth control/protection: None    ROS Constitutional: Denies fever, chills, weight loss/gain, headaches, insomnia,  night sweats or change in appetite. Does c/o fatigue. Eyes: Denies redness, blurred vision, diplopia, discharge, itchy or watery eyes.  ENT: Denies discharge, congestion, post nasal drip, epistaxis, sore throat, earache, hearing loss, dental pain, Tinnitus, Vertigo, Sinus pain or snoring.  Cardio: Denies chest pain, palpitations, irregular heartbeat, syncope, dyspnea, diaphoresis, orthopnea, PND, claudication or edema Respiratory: denies cough, dyspnea, DOE, pleurisy, hoarseness, laryngitis or wheezing.  Gastrointestinal: Denies dysphagia, heartburn, reflux, water brash, pain, cramps, nausea, vomiting, bloating, diarrhea, constipation, hematemesis, melena, hematochezia, jaundice or hemorrhoids Genitourinary: Denies dysuria, frequency, urgency, nocturia, hesitancy, discharge, hematuria or flank pain Musculoskeletal: Denies arthralgia, myalgia, stiffness, Jt. Swelling, pain, limp or strain/sprain. Denies Falls. Skin: Denies puritis, rash, hives, warts, acne, eczema or change in skin lesion Neuro: No weakness, tremor, incoordination, spasms, paresthesia or pain Psychiatric: Denies confusion, memory loss or sensory loss. Denies Depression. Endocrine: Denies change in weight, skin, hair change, nocturia, and paresthesia, diabetic polys, visual blurring or hyper / hypo glycemic episodes.  Heme/Lymph: No excessive bleeding, bruising or enlarged lymph nodes.  Physical Exam  BP (!) 150/96   Pulse 92   Temp 97.7 F (36.5 C)   Resp 18   Ht 5'  10" (1.778 m)   Wt 229 lb 12.8 oz (104.2 kg)   BMI 32.97 kg/m   General Appearance: Well nourished and well groomed and in no apparent distress.  Eyes: PERRLA, EOMs, conjunctiva no swelling or erythema, normal fundi and vessels. Sinuses: No frontal/maxillary tenderness ENT/Mouth: EACs patent / TMs  nl. Nares clear without erythema, swelling, mucoid exudates. Oral hygiene is good. No erythema, swelling, or exudate. Tongue normal, non-obstructing. Tonsils not swollen or erythematous. Hearing normal.  Neck: Supple, thyroid normal. No bruits, nodes or JVD. Respiratory: Respiratory effort normal.  BS equal and clear bilateral without rales, rhonci, wheezing or stridor. Cardio: Heart sounds are normal with regular rate and rhythm and no murmurs, rubs or gallops. Peripheral pulses are normal and equal bilaterally without edema. No aortic or femoral bruits. Chest: symmetric with normal excursions and percussion.  Abdomen: Soft, with Nl bowel sounds. Nontender, no guarding, rebound, hernias, masses, or organomegaly.  Lymphatics: Non tender without lymphadenopathy.  Genitourinary: No hernias.Testes nl. DRE - prostate 2+ sl enlarged - smooth & firm w/o nodules. Musculoskeletal: Full ROM all peripheral extremities, joint  stability, 5/5 strength, and normal gait. Skin: Warm and dry without rashes, lesions, cyanosis, clubbing or  ecchymosis. Bilat thickened dystrophic deformed toenails.  Neuro: Cranial nerves intact, reflexes equal bilaterally. Normal muscle tone, no cerebellar symptoms. Sensation intact.  Pysch: Alert and oriented X 3 with normal affect, insight and judgment appropriate.   Assessment and Plan  1. Annual Preventative/Screening Exam   2. Essential hypertension  - EKG 12-Lead - [place], RETROPERITNL ABD,  LTD - Urinalysis, Routine w reflex microscopic - Microalbumin / creatinine urine ratio - CBC with Differential/Platelet - BASIC METABOLIC PANEL WITH GFR - Magnesium - TSH  - D/C  HCTZ - bisoprolol-hydrochlorothiazide (ZIAC) 5-6.25 MG tablet; Take 1 tablet every morning  for BP  Dispense: 90 tablet; Refill: 1 - lisinopril (PRINIVIL,ZESTRIL) 20 MG tablet; Take 1 tablet at bedtime for BP  Dispense: 90 tablet; Refill: 1 - ROV 1 month to recheck BP.   3. Hyperlipidemia, mixed  - EKG 12-Lead - [place], RETROPERITNL ABD,  LTD - Hepatic function panel - Lipid panel - TSH  4. PreDiabetes  - Hemoglobin A1c - Insulin, random  5. Vitamin D deficiency  - VITAMIN D 25 Hydroxy  6. Idiopathic gout  - Uric acid  7. Testosterone deficiency  - Testosterone  8. Benign localized prostatic hyperplasia with lower urinary tract symptoms (LUTS)  - finasteride (PROSCAR) 5 MG tablet; Take 1 tablet daily for Prostate & hair loss  Dispense: 90 tablet; Refill: 3  9. Onychomycosis  - terbinafine (LAMISIL) 250 MG tablet; Take 1 tablet daily for toenail fungus  Dispense: 90 tablet; Refill: 1  10. Morbid obesity (HCC)  - phentermine (ADIPEX-P) 37.5 MG tablet; Take 1/2 to 1 tablet daily for dieting & Weight Loss  Dispense: 30 tablet; Refill: 5  11. Vitamin B 12 deficiency  - Vitamin B12  12. Screening for colorectal cancer  - POC Hemoccult Bld/Stl   13. Prostate cancer screening  - PSA  14. Screening for ischemic heart disease  - EKG 12-Lead  15. Screening for AAA (aortic abdominal aneurysm)  - [place], RETROPERITNL ABD,  LTD  16. Screening examination for pulmonary tuberculosis  - PPD  17. Fatigue, unspecified type  - Iron,Total/Total Iron Binding Cap - Testosterone - CBC with Differential/Platelet - Vitamin B12  18. Medication management  - Urinalysis, Routine w reflex microscopic - Microalbumin / creatinine urine ratio - Testosterone - CBC with Differential/Platelet - BASIC METABOLIC PANEL WITH GFR - Hepatic function panel - Magnesium - Lipid panel - TSH - Hemoglobin A1c - Insulin, random - VITAMIN D 25 Hydroxy - Vitamin B12 - Uric  acid      Patient was counseled in prudent diet, weight control to achieve/maintain BMI less than 25, BP monitoring, regular exercise and medications as discussed.  Discussed med effects and SE's. Routine screening labs and tests as requested with regular follow-up as recommended. Over 40 minutes of exam, counseling, chart review and high complex critical decision making was performed

## 2016-12-27 NOTE — Patient Instructions (Signed)

## 2016-12-28 ENCOUNTER — Encounter: Payer: Self-pay | Admitting: *Deleted

## 2016-12-28 ENCOUNTER — Other Ambulatory Visit: Payer: Self-pay | Admitting: Internal Medicine

## 2016-12-28 LAB — URIC ACID: Uric Acid, Serum: 5.1 mg/dL (ref 4.0–8.0)

## 2016-12-28 LAB — MICROALBUMIN / CREATININE URINE RATIO
CREATININE, URINE: 57 mg/dL (ref 20–320)
Microalb Creat Ratio: 7 mcg/mg creat (ref <30)
Microalb, Ur: 0.4 mg/dL

## 2016-12-28 LAB — CBC WITH DIFFERENTIAL/PLATELET
Basophils Absolute: 115 cells/uL (ref 0–200)
Basophils Relative: 0.9 %
EOS ABS: 525 {cells}/uL — AB (ref 15–500)
Eosinophils Relative: 4.1 %
HEMATOCRIT: 53.6 % — AB (ref 38.5–50.0)
Hemoglobin: 18.1 g/dL — ABNORMAL HIGH (ref 13.2–17.1)
LYMPHS ABS: 3162 {cells}/uL (ref 850–3900)
MCH: 30.5 pg (ref 27.0–33.0)
MCHC: 33.8 g/dL (ref 32.0–36.0)
MCV: 90.4 fL (ref 80.0–100.0)
MPV: 10.4 fL (ref 7.5–12.5)
Monocytes Relative: 12.1 %
NEUTROS PCT: 58.2 %
Neutro Abs: 7450 cells/uL (ref 1500–7800)
Platelets: 325 10*3/uL (ref 140–400)
RBC: 5.93 10*6/uL — AB (ref 4.20–5.80)
RDW: 13.6 % (ref 11.0–15.0)
Total Lymphocyte: 24.7 %
WBC: 12.8 10*3/uL — AB (ref 3.8–10.8)
WBCMIX: 1549 {cells}/uL — AB (ref 200–950)

## 2016-12-28 LAB — URINALYSIS, ROUTINE W REFLEX MICROSCOPIC
BILIRUBIN URINE: NEGATIVE
GLUCOSE, UA: NEGATIVE
Hgb urine dipstick: NEGATIVE
Ketones, ur: NEGATIVE
Leukocytes, UA: NEGATIVE
Nitrite: NEGATIVE
Protein, ur: NEGATIVE
SPECIFIC GRAVITY, URINE: 1.013 (ref 1.001–1.03)

## 2016-12-28 LAB — HEPATIC FUNCTION PANEL
AG Ratio: 1.4 (calc) (ref 1.0–2.5)
ALBUMIN MSPROF: 4.2 g/dL (ref 3.6–5.1)
ALT: 72 U/L — ABNORMAL HIGH (ref 9–46)
AST: 38 U/L — ABNORMAL HIGH (ref 10–35)
Alkaline phosphatase (APISO): 70 U/L (ref 40–115)
BILIRUBIN DIRECT: 0.2 mg/dL (ref 0.0–0.2)
BILIRUBIN TOTAL: 1.1 mg/dL (ref 0.2–1.2)
GLOBULIN: 2.9 g/dL (ref 1.9–3.7)
Indirect Bilirubin: 0.9 mg/dL (calc) (ref 0.2–1.2)
Total Protein: 7.1 g/dL (ref 6.1–8.1)

## 2016-12-28 LAB — INSULIN, RANDOM: INSULIN: 6 u[IU]/mL (ref 2.0–19.6)

## 2016-12-28 LAB — PSA: PSA: 0.5 ng/mL (ref <=4.0)

## 2016-12-28 LAB — IRON, TOTAL/TOTAL IRON BINDING CAP
%SAT: 50 % (calc) (ref 15–60)
IRON: 177 ug/dL (ref 50–180)
TIBC: 356 mcg/dL (calc) (ref 250–425)

## 2016-12-28 LAB — HEMOGLOBIN A1C
EAG (MMOL/L): 5.5 (calc)
Hgb A1c MFr Bld: 5.1 % of total Hgb (ref <5.7)
MEAN PLASMA GLUCOSE: 100 (calc)

## 2016-12-28 LAB — BASIC METABOLIC PANEL WITH GFR
BUN: 18 mg/dL (ref 7–25)
CALCIUM: 9.7 mg/dL (ref 8.6–10.3)
CO2: 32 mmol/L (ref 20–32)
Chloride: 97 mmol/L — ABNORMAL LOW (ref 98–110)
Creat: 1.12 mg/dL (ref 0.70–1.33)
GFR, EST AFRICAN AMERICAN: 85 mL/min/{1.73_m2} (ref >=60)
GFR, EST NON AFRICAN AMERICAN: 73 mL/min/{1.73_m2} (ref >=60)
Glucose, Bld: 79 mg/dL (ref 65–99)
POTASSIUM: 4.7 mmol/L (ref 3.5–5.3)
Sodium: 137 mmol/L (ref 135–146)

## 2016-12-28 LAB — MAGNESIUM: Magnesium: 2.2 mg/dL (ref 1.5–2.5)

## 2016-12-28 LAB — LIPID PANEL
Cholesterol: 221 mg/dL — ABNORMAL HIGH (ref <200)
HDL: 54 mg/dL (ref >40)
LDL CHOLESTEROL (CALC): 138 mg/dL — AB
NON-HDL CHOLESTEROL (CALC): 167 mg/dL — AB (ref <130)
TRIGLYCERIDES: 157 mg/dL — AB (ref <150)
Total CHOL/HDL Ratio: 4.1 (calc) (ref <5.0)

## 2016-12-28 LAB — VITAMIN B12: Vitamin B-12: 753 pg/mL (ref 200–1100)

## 2016-12-28 LAB — VITAMIN D 25 HYDROXY (VIT D DEFICIENCY, FRACTURES): Vit D, 25-Hydroxy: 84 ng/mL (ref 30–100)

## 2016-12-28 LAB — TSH: TSH: 3.24 m[IU]/L (ref 0.40–4.50)

## 2016-12-28 LAB — TESTOSTERONE: Testosterone: 1333 ng/dL — ABNORMAL HIGH (ref 250–827)

## 2016-12-28 MED ORDER — ROSUVASTATIN CALCIUM 40 MG PO TABS: ORAL_TABLET | ORAL | 1 refills | Status: DC

## 2016-12-29 ENCOUNTER — Other Ambulatory Visit: Payer: Self-pay | Admitting: Internal Medicine

## 2016-12-29 DIAGNOSIS — B351 Tinea unguium: Secondary | ICD-10-CM

## 2016-12-29 LAB — TB SKIN TEST
INDURATION: 0 mm
TB SKIN TEST: NEGATIVE

## 2017-01-01 ENCOUNTER — Other Ambulatory Visit: Payer: Self-pay | Admitting: *Deleted

## 2017-01-01 DIAGNOSIS — B351 Tinea unguium: Secondary | ICD-10-CM

## 2017-01-01 MED ORDER — TERBINAFINE HCL 250 MG PO TABS: ORAL_TABLET | ORAL | 0 refills | Status: DC

## 2017-01-28 NOTE — Progress Notes (Signed)
Assessment and Plan:  Thomas Waller was seen today for follow-up.  Diagnoses and all orders for this visit:  Essential hypertension Continue medications as prescribed Start checking blood pressure at home; call if consistently over 130/80 Continue DASH diet.   Reminder to go to the ER if any CP, SOB, nausea, dizziness, severe HA, changes vision/speech, left arm numbness and tingling and jaw pain. -     BASIC METABOLIC PANEL WITH GFR  Medication management -     BASIC METABOLIC PANEL WITH GFR -     CBC with Differential/Platelet -     Hepatic function panel  Mixed hyperlipidemia Newly initiated on statin; tolerating well without myalgias Continue weight loss efforts, diet/exercise -     Lipid panel, hepatic panel  Obesity Long discussion about weight loss, diet, and exercise Recommended diet heavy in fruits and veggies and low in animal meats, cheeses, and dairy products, appropriate calorie intake Discussed appropriate weight for height and initial goal - below 200 lb this year Follow up 5 months  Further disposition pending results of labs. Discussed med's effects and SE's.   Over 15 minutes of exam, counseling, chart review, and critical decision making was performed.   Future Appointments  Date Time Provider [place]  07/02/2017  9:00 AM Liane Comber, NP [facility] None  01/23/2018  9:00 AM Unk Pinto, MD [facility] None    ------------------------------------------------------------------------------------------------------------------   HPI BP 112/76   Pulse 70   Temp 97.7 F (36.5 C)   Wt 226 lb 9.6 oz (102.8 kg)   SpO2 99%   BMI 32.51 kg/m   57 y.o.male presents for 1 month follow up for htn after elevated blood pressure of 150/96 with manual recheck of 164/104 was noted. He was previously treated by hctz 25 mg daily and has been well/fairly controlled. He was switched to ziac 5-6.25 mg 1 tab daily AM and lisinopril 20 mg - 1 tab daily PM. He denies SE  and is tolerating well. He reports he has a BP cuff at home but has not been checking.   BP Readings from Last 3 Encounters:  01/29/17 112/76  12/27/16 (!) 150/96  12/13/16 140/84   He has not been checking BP at home, today their BP is BP: 112/76  He does workout. He denies chest pain, shortness of breath, dizziness.  BMI is Body mass index is 32.51 kg/m., he has been working on diet and exercise. Wt Readings from Last 3 Encounters:  01/29/17 226 lb 9.6 oz (102.8 kg)  12/27/16 229 lb 12.8 oz (104.2 kg)  12/13/16 239 lb 12.8 oz (108.8 kg)    He is on cholesterol medication (crestor 40 mg 3x/weekly initiated after last visit) and denies myalgias. His cholesterol is not at goal. The cholesterol last visit was:   Lab Results  Component Value Date   CHOL 221 (H) 12/27/2016   HDL 54 12/27/2016   LDLCALC 110 (H) 05/22/2016   TRIG 157 (H) 12/27/2016   CHOLHDL 4.1 12/27/2016        Past Medical History:  Diagnosis Date  . Arthritis    Reiters Polyarthritis  . Elevated hemoglobin A1c measurement   . Gout   . Hyperlipidemia   . Hypertension      No Known Allergies  Current Outpatient Medications on File Prior to Visit  Medication Sig  . allopurinol (ZYLOPRIM) 300 MG tablet Take 300 mg by mouth daily.   Marland Kitchen aspirin EC 81 MG tablet Take 81 mg by mouth daily.  Marland Kitchen  B-D 3CC LUER-LOK SYR 21GX1" 21G X 1" 3 ML MISC USE AS DIRECTED WITH TESTOSTERONE INJECTION EVERY 14 DAYS  . bisoprolol-hydrochlorothiazide (ZIAC) 5-6.25 MG tablet Take 1 tablet every morning  for BP  . Cholecalciferol (VITAMIN D PO) Take 5,000 Units by mouth 2 (two) times daily.  Scarlette Shorts SURECLICK 50 MG/ML injection   . finasteride (PROSCAR) 5 MG tablet Take 1 tablet daily for Prostate & hair loss  . fluocinonide ointment (LIDEX) 0.05 %   . lisinopril (PRINIVIL,ZESTRIL) 20 MG tablet Take 1 tablet at bedtime for BP  . Magnesium 500 MG TABS Take 1 tablet by mouth daily.  . Multiple Vitamin (MULTIVITAMIN) tablet Take 1  tablet by mouth daily.  . Omega-3 Fatty Acids (FISH OIL) 1200 MG CAPS Take by mouth 2 (two) times daily.  . phentermine (ADIPEX-P) 37.5 MG tablet Take 1/2 to 1 tablet daily for dieting & Weight Loss  . rosuvastatin (CRESTOR) 40 MG tablet Take 1/2 to 1 tablet daily or as directed for Cholesterol  . terbinafine (LAMISIL) 250 MG tablet TAKE 1 TABLET BY MOUTH ONCE DAILY ALTERNATING MONTHS AS DIRECTED  . testosterone cypionate (DEPOTESTOSTERONE CYPIONATE) 200 MG/ML injection INJECT 2 MILLILITERS INTRAMUSCULARLY EVERY 2 WEEKS  . vitamin B-12 (CYANOCOBALAMIN) 1000 MCG tablet Take 1,000 mcg by mouth daily.  . vitamin C (ASCORBIC ACID) 500 MG tablet Take 500 mg by mouth 2 (two) times daily.  . Zinc 50 MG TABS Take 1 tablet by mouth daily.   No current facility-administered medications on file prior to visit.     ROS: Review of Systems  Constitutional: Negative for malaise/fatigue and weight loss.  HENT: Negative for hearing loss and tinnitus.   Eyes: Negative for blurred vision and double vision.  Respiratory: Negative for cough, shortness of breath and wheezing.   Cardiovascular: Negative for chest pain, palpitations, orthopnea, claudication and leg swelling.  Gastrointestinal: Negative for abdominal pain, blood in stool, constipation, diarrhea, heartburn, melena, nausea and vomiting.  Genitourinary: Negative.   Musculoskeletal: Negative for joint pain and myalgias.  Skin: Negative for rash.  Neurological: Negative for dizziness, tingling, sensory change, weakness and headaches.  Endo/Heme/Allergies: Negative for polydipsia.  Psychiatric/Behavioral: Negative.   All other systems reviewed and are negative.    Physical Exam:  BP 112/76   Pulse 70   Temp 97.7 F (36.5 C)   Wt 226 lb 9.6 oz (102.8 kg)   SpO2 99%   BMI 32.51 kg/m   General Appearance: Well nourished, in no apparent distress. Eyes: PERRLA, EOMs, conjunctiva no swelling or erythema Sinuses: No Frontal/maxillary  tenderness ENT/Mouth: Ext aud canals clear, TMs without erythema, bulging. No erythema, swelling, or exudate on post pharynx.  Tonsils not swollen or erythematous. Hearing normal.  Neck: Supple, thyroid normal.  Respiratory: Respiratory effort normal, BS equal bilaterally without rales, rhonchi, wheezing or stridor.  Cardio: RRR with no MRGs. Brisk peripheral pulses without edema. Superficial varicose veins bilateral lower extremity. Non-tender.  Abdomen: Soft, + BS.  Non tender, no guarding, rebound, hernias, masses. Lymphatics: Non tender without lymphadenopathy.  Musculoskeletal: Full ROM, 5/5 strength, normal gait.  Skin: Warm, dry without rashes, lesions, ecchymosis.  Neuro: Cranial nerves intact. Normal muscle tone, no cerebellar symptoms. Sensation intact.  Psych: Awake and oriented X 3, normal affect, Insight and Judgment appropriate.     Izora Ribas, NP 9:47 AM [facility]

## 2017-01-29 ENCOUNTER — Encounter: Payer: Self-pay | Admitting: Adult Health

## 2017-01-29 ENCOUNTER — Ambulatory Visit (INDEPENDENT_AMBULATORY_CARE_PROVIDER_SITE_OTHER): Payer: 59 | Admitting: Adult Health

## 2017-01-29 VITALS — BP 112/76 | HR 70 | Temp 97.7°F | Wt 226.6 lb

## 2017-01-29 DIAGNOSIS — R945 Abnormal results of liver function studies: Secondary | ICD-10-CM | POA: Diagnosis not present

## 2017-01-29 DIAGNOSIS — E782 Mixed hyperlipidemia: Secondary | ICD-10-CM | POA: Diagnosis not present

## 2017-01-29 DIAGNOSIS — R7989 Other specified abnormal findings of blood chemistry: Secondary | ICD-10-CM

## 2017-01-29 DIAGNOSIS — Z79899 Other long term (current) drug therapy: Secondary | ICD-10-CM | POA: Diagnosis not present

## 2017-01-29 DIAGNOSIS — I10 Essential (primary) hypertension: Secondary | ICD-10-CM

## 2017-01-29 DIAGNOSIS — E669 Obesity, unspecified: Secondary | ICD-10-CM

## 2017-01-29 LAB — HEPATIC FUNCTION PANEL
AG RATIO: 1.8 (calc) (ref 1.0–2.5)
ALT: 81 U/L — ABNORMAL HIGH (ref 9–46)
AST: 36 U/L — ABNORMAL HIGH (ref 10–35)
Albumin: 4.2 g/dL (ref 3.6–5.1)
Alkaline phosphatase (APISO): 72 U/L (ref 40–115)
BILIRUBIN DIRECT: 0.1 mg/dL (ref 0.0–0.2)
BILIRUBIN INDIRECT: 0.3 mg/dL (ref 0.2–1.2)
GLOBULIN: 2.4 g/dL (ref 1.9–3.7)
Total Bilirubin: 0.4 mg/dL (ref 0.2–1.2)
Total Protein: 6.6 g/dL (ref 6.1–8.1)

## 2017-01-29 LAB — CBC WITH DIFFERENTIAL/PLATELET
BASOS PCT: 0.9 %
Basophils Absolute: 99 cells/uL (ref 0–200)
EOS ABS: 726 {cells}/uL — AB (ref 15–500)
Eosinophils Relative: 6.6 %
HEMATOCRIT: 49 % (ref 38.5–50.0)
HEMOGLOBIN: 16.7 g/dL (ref 13.2–17.1)
LYMPHS ABS: 3091 {cells}/uL (ref 850–3900)
MCH: 30.8 pg (ref 27.0–33.0)
MCHC: 34.1 g/dL (ref 32.0–36.0)
MCV: 90.4 fL (ref 80.0–100.0)
MPV: 10.5 fL (ref 7.5–12.5)
Monocytes Relative: 11.9 %
NEUTROS ABS: 5775 {cells}/uL (ref 1500–7800)
Neutrophils Relative %: 52.5 %
Platelets: 287 10*3/uL (ref 140–400)
RBC: 5.42 10*6/uL (ref 4.20–5.80)
RDW: 13.1 % (ref 11.0–15.0)
Total Lymphocyte: 28.1 %
WBC: 11 10*3/uL — ABNORMAL HIGH (ref 3.8–10.8)
WBCMIX: 1309 {cells}/uL — AB (ref 200–950)

## 2017-01-29 LAB — LIPID PANEL
CHOL/HDL RATIO: 3.3 (calc) (ref <5.0)
Cholesterol: 144 mg/dL (ref <200)
HDL: 44 mg/dL (ref >40)
LDL CHOLESTEROL (CALC): 74 mg/dL
NON-HDL CHOLESTEROL (CALC): 100 mg/dL (ref <130)
Triglycerides: 166 mg/dL — ABNORMAL HIGH (ref <150)

## 2017-01-29 LAB — BASIC METABOLIC PANEL WITH GFR
BUN: 16 mg/dL (ref 7–25)
CALCIUM: 9.7 mg/dL (ref 8.6–10.3)
CO2: 33 mmol/L — AB (ref 20–32)
CREATININE: 1.07 mg/dL (ref 0.70–1.33)
Chloride: 101 mmol/L (ref 98–110)
GFR, EST NON AFRICAN AMERICAN: 77 mL/min/{1.73_m2} (ref >=60)
GFR, Est African American: 89 mL/min/{1.73_m2} (ref >=60)
Glucose, Bld: 72 mg/dL (ref 65–99)
Potassium: 4.7 mmol/L (ref 3.5–5.3)
SODIUM: 138 mmol/L (ref 135–146)

## 2017-01-29 NOTE — Patient Instructions (Signed)
HOME CARE INSTRUCTIONS   Do not stand or sit in one position for long periods of time. Do not sit with your legs crossed. Rest with your legs raised during the day.  Your legs have to be higher than your heart so that gravity will force the valves to open, so please really elevate your legs.   Wear elastic stockings or support hose. Do not wear other tight, encircling garments around the legs, pelvis, or waist.  [employer]  has a wide variety of well priced compression stockings. [place], [place] [id number] [id number] [place] has a good cheap selection, I like the socks, they are not as hard to get on  Walk as much as possible to increase blood flow.  Raise the foot of your bed at night with 2-inch blocks. SEEK MEDICAL CARE IF:   The skin around your ankle starts to break down.  You have pain, redness, tenderness, or hard swelling developing in your leg over a vein.  You are uncomfortable due to leg pain. Document Released: 10/12/2004 Document Revised: 03/27/2011 Document Reviewed: 02/28/2010 [facility] Patient Information 2014 [place].    Be sure you are well hydrated - aim for 80-100 fluid ounces daily of water or other unsweetened beverage  Ways to cut 100 calories  1. Eat your eggs with hot sauce OR salsa instead of cheese.  Eggs are great for breakfast, but many people consider eggs and cheese to be BFFs. Instead of cheese-1 oz. of cheddar has 114 calories-top your eggs with hot sauce, which contains no calories and helps with satiety and metabolism. Salsa is also a great option!!  2. Top your toast, waffles or pancakes with mashed berries instead of jelly or syrup. Half a cup of berries-fresh, frozen or thawed-has about 40 calories, compared with 2 tbsp. of maple syrup or jelly, which both have about 100 calories. The berries will also give you a good punch of fiber, which helps keep you full and satisfied and won't spike blood sugar quickly like  the jelly or syrup. 3. Swap the non-fat latte for black coffee with a splash of half-and-half. Contrary to its name, that non-fat latte has 130 calories and a startling 19g of carbohydrates per 16 oz. serving. Replacing that 'light' drinkable dessert with a black coffee with a splash of half-and-half saves you more than 100 calories per 16 oz. serving. 4. Sprinkle salads with freeze-dried raspberries instead of dried cranberries. If you want a sweet addition to your nutritious salad, stay away from dried cranberries. They have a whopping 130 calories per  cup and 30g carbohydrates. Instead, sprinkle freeze-dried raspberries guilt-free and save more than 100 calories per  cup serving, adding 3g of belly-filling fiber. 5. Go for mustard in place of mayo on your sandwich. Mustard can add really nice flavor to any sandwich, and there are tons of varieties, from spicy to honey. A serving of mayo is 95 calories, versus 10 calories in a serving of mustard. 6. Choose a DIY salad dressing instead of the store-bought kind. Mix Dijon or whole grain mustard with low-fat Kefir or red wine vinegar and garlic. 7. Use hummus as a spread instead of a dip. Use hummus as a spread on a high-fiber cracker or tortilla with a sandwich and save on calories without sacrificing taste. 8. Pick just one salad "accessory." Salad isn't automatically a calorie winner. It's easy to over-accessorize with toppings. Instead of topping your salad with nuts, avocado and  cranberries (all three will clock in at 313 calories), just pick one. The next day, choose a different accessory, which will also keep your salad interesting. You don't wear all your jewelry every day, right? 9. Ditch the white pasta in favor of spaghetti squash. One cup of cooked spaghetti squash has about 40 calories, compared with traditional spaghetti, which comes with more than 200. Spaghetti squash is also nutrient-dense. It's a good source of fiber and Vitamins A  and C, and it can be eaten just like you would eat pasta-with a great tomato sauce and [place] meatballs or with pesto, tofu and spinach, for example. 10. Dress up your chili, soups and stews with non-fat [place] yogurt instead of sour cream. Just a 'dollop' of sour cream can set you back 115 calories and a whopping 12g of fat-seven of which are of the artery-clogging variety. Added bonus: [place] yogurt is packed with muscle-building protein, calcium and B Vitamins. 11. Mash cauliflower instead of mashed potatoes. One cup of traditional mashed potatoes-in all their creamy goodness-has more than 200 calories, compared to mashed cauliflower, which you can typically eat for less than 100 calories per 1 cup serving. Cauliflower is a great source of the antioxidant indole-3-carbinol (I3C), which may help reduce the risk of some cancers, like breast cancer. 12. Ditch the ice cream sundae in favor of a [place] yogurt parfait. Instead of a cup of ice cream or fro-yo for dessert, try 1 cup of nonfat Greek yogurt topped with fresh berries and a sprinkle of cacao nibs. Both toppings are packed with antioxidants, which can help reduce cellular inflammation and oxidative damage. And the comparison is a no-brainer: One cup of ice cream has about 275 calories; one cup of frozen yogurt has about 230; and a cup of Greek yogurt has just 130, plus twice the protein, so you're less likely to return to the freezer for a second helping. 13. Put olive oil in a spray container instead of using it directly from the bottle. Each tablespoon of olive oil is 120 calories and 15g of fat. Use a mister instead of pouring it straight into the pan or onto a salad. This allows for portion control and will save you more than 100 calories. 14. When baking, substitute canned pumpkin for butter or oil. Canned pumpkin-not pumpkin pie mix-is loaded with Vitamin A, which is important for skin and eye health, as well as immunity. And the comparisons are  pretty crazy:  cup of canned pumpkin has about 40 calories, compared to butter or oil, which has more than 800 calories. Yes, 800 calories. Applesauce and mashed banana can also serve as good substitutions for butter or oil, usually in a 1:1 ratio. 15. Top casseroles with high-fiber cereal instead of breadcrumbs. Breadcrumbs are typically made with white bread, while breakfast cereals contain 5-9g of fiber per serving. Not only will you save more than 150 calories per  cup serving, the swap will also keep you more full and you'll get a metabolism boost from the added fiber. 16. Snack on pistachios instead of macadamia nuts. Believe it or not, you get the same amount of calories from 35 pistachios (100 calories) as you would from only five macadamia nuts. 17. Chow down on kale chips rather than potato chips. This is my favorite 'don't knock it 'till you try it' swap. Kale chips are so easy to make at home, and you can spice them up with a little grated parmesan or chili powder. Plus, they're a  mere fraction of the calories of potato chips, but with the same crunch factor we crave so often. 18. Add seltzer and some fruit slices to your cocktail instead of soda or fruit juice. One cup of soda or fruit juice can pack on as much as 140 calories. Instead, use seltzer and fruit slices. The fruit provides valuable phytochemicals, such as flavonoids and anthocyanins, which help to combat cancer and stave off the aging process.  Monitor your blood pressure at home. Go to the ER if any CP, SOB, nausea, dizziness, severe HA, changes vision/speech  Goal BP:  For patients younger than 60: Goal BP < 140/90. For patients 60 and older: Goal BP < 150/90. For patients with diabetes: Goal BP < 140/90. Your most recent BP: BP: 112/76   Take your medications faithfully as instructed. Maintain a healthy weight. Get at least 150 minutes of aerobic exercise per week. Minimize salt intake. Minimize alcohol  intake  DASH Eating Plan DASH stands for "Dietary Approaches to Stop Hypertension." The DASH eating plan is a healthy eating plan that has been shown to reduce high blood pressure (hypertension). Additional health benefits may include reducing the risk of type 2 diabetes mellitus, heart disease, and stroke. The DASH eating plan may also help with weight loss. WHAT DO I NEED TO KNOW ABOUT THE DASH EATING PLAN? For the DASH eating plan, you will follow these general guidelines:  Choose foods with a percent daily value for sodium of less than 5% (as listed on the food label).  Use salt-free seasonings or herbs instead of table salt or sea salt.  Check with your health care provider or pharmacist before using salt substitutes.  Eat lower-sodium products, often labeled as "lower sodium" or "no salt added."  Eat fresh foods.  Eat more vegetables, fruits, and low-fat dairy products.  Choose whole grains. Look for the word "whole" as the first word in the ingredient list.  Choose fish and skinless chicken or [place] more often than red meat. Limit fish, poultry, and meat to 6 oz (170 g) each day.  Limit sweets, desserts, sugars, and sugary drinks.  Choose heart-healthy fats.  Limit cheese to 1 oz (28 g) per day.  Eat more home-cooked food and less restaurant, buffet, and fast food.  Limit fried foods.  Cook foods using methods other than frying.  Limit canned vegetables. If you do use them, rinse them well to decrease the sodium.  When eating at a restaurant, ask that your food be prepared with less salt, or no salt if possible. WHAT FOODS CAN I EAT? Seek help from a dietitian for individual calorie needs. Grains Whole grain or whole wheat bread. Brown rice. Whole grain or whole wheat pasta. Quinoa, bulgur, and whole grain cereals. Low-sodium cereals. Corn or whole wheat flour tortillas. Whole grain cornbread. Whole grain crackers. Low-sodium crackers. Vegetables Fresh or frozen  vegetables (raw, steamed, roasted, or grilled). Low-sodium or reduced-sodium tomato and vegetable juices. Low-sodium or reduced-sodium tomato sauce and paste. Low-sodium or reduced-sodium canned vegetables.  Fruits All fresh, canned (in natural juice), or frozen fruits. Meat and Other Protein Products Ground beef (85% or leaner), grass-fed beef, or beef trimmed of fat. Skinless chicken or [place]. Ground chicken or [place]. Pork trimmed of fat. All fish and seafood. Eggs. Dried beans, peas, or lentils. Unsalted nuts and seeds. Unsalted canned beans. Dairy Low-fat dairy products, such as skim or 1% milk, 2% or reduced-fat cheeses, low-fat ricotta or cottage cheese, or plain low-fat yogurt. Low-sodium  or reduced-sodium cheeses. Fats and Oils Tub margarines without trans fats. Light or reduced-fat mayonnaise and salad dressings (reduced sodium). Avocado. Safflower, olive, or canola oils. Natural peanut or almond butter. Other Unsalted popcorn and pretzels. The items listed above may not be a complete list of recommended foods or beverages. Contact your dietitian for more options. WHAT FOODS ARE NOT RECOMMENDED? Grains White bread. White pasta. White rice. Refined cornbread. Bagels and croissants. Crackers that contain trans fat. Vegetables Creamed or fried vegetables. Vegetables in a cheese sauce. Regular canned vegetables. Regular canned tomato sauce and paste. Regular tomato and vegetable juices. Fruits Dried fruits. Canned fruit in light or heavy syrup. Fruit juice. Meat and Other Protein Products Fatty cuts of meat. Ribs, chicken wings, bacon, sausage, bologna, salami, chitterlings, fatback, hot dogs, bratwurst, and packaged luncheon meats. Salted nuts and seeds. Canned beans with salt. Dairy Whole or 2% milk, cream, half-and-half, and cream cheese. Whole-fat or sweetened yogurt. Full-fat cheeses or blue cheese. Nondairy creamers and whipped toppings. Processed cheese, cheese spreads, or cheese  curds. Condiments Onion and garlic salt, seasoned salt, table salt, and sea salt. Canned and packaged gravies. Worcestershire sauce. Tartar sauce. Barbecue sauce. Teriyaki sauce. Soy sauce, including reduced sodium. Steak sauce. Fish sauce. Oyster sauce. Cocktail sauce. Horseradish. Ketchup and mustard. Meat flavorings and tenderizers. Bouillon cubes. Hot sauce. Tabasco sauce. Marinades. Taco seasonings. Relishes. Fats and Oils Butter, stick margarine, lard, shortening, ghee, and bacon fat. Coconut, palm kernel, or palm oils. Regular salad dressings. Other Pickles and olives. Salted popcorn and pretzels. The items listed above may not be a complete list of foods and beverages to avoid. Contact your dietitian for more information. WHERE CAN I FIND MORE INFORMATION? National Heart, Lung, and Blood Institute: travelstabloid.com Document Released: 12/22/2010 Document Revised: 05/19/2013 Document Reviewed: 11/06/2012 [facility] Patient Information 2015 [place], [place]. This information is not intended to replace advice given to you by your health care provider. Make sure you discuss any questions you have with your health care provider.

## 2017-02-14 ENCOUNTER — Encounter: Payer: Self-pay | Admitting: *Deleted

## 2017-02-15 LAB — COLOGUARD: COLOGUARD: NEGATIVE

## 2017-02-16 ENCOUNTER — Encounter: Payer: Self-pay | Admitting: *Deleted

## 2017-02-17 ENCOUNTER — Other Ambulatory Visit: Payer: Self-pay | Admitting: Internal Medicine

## 2017-02-22 ENCOUNTER — Other Ambulatory Visit: Payer: Self-pay | Admitting: Physician Assistant

## 2017-02-22 DIAGNOSIS — E349 Endocrine disorder, unspecified: Secondary | ICD-10-CM

## 2017-02-25 ENCOUNTER — Other Ambulatory Visit: Payer: Self-pay | Admitting: Internal Medicine

## 2017-02-25 DIAGNOSIS — N401 Enlarged prostate with lower urinary tract symptoms: Secondary | ICD-10-CM

## 2017-02-25 MED ORDER — FINASTERIDE 5 MG PO TABS: ORAL_TABLET | ORAL | 3 refills | Status: DC

## 2017-05-20 ENCOUNTER — Other Ambulatory Visit: Payer: Self-pay | Admitting: Internal Medicine

## 2017-05-20 DIAGNOSIS — E349 Endocrine disorder, unspecified: Secondary | ICD-10-CM

## 2017-06-24 ENCOUNTER — Other Ambulatory Visit: Payer: Self-pay | Admitting: Internal Medicine

## 2017-07-01 DIAGNOSIS — E291 Testicular hypofunction: Secondary | ICD-10-CM | POA: Insufficient documentation

## 2017-07-01 NOTE — Progress Notes (Signed)
FOLLOW UP  Assessment and Plan:   Hypertension Initially elevated but resolved to near goal at recheck Patient to start checking BP at home - he is in agreement Monitor blood pressure at home; patient to call if consistently greater than 130/80 Continue DASH diet.   Reminder to go to the ER if any CP, SOB, nausea, dizziness, severe HA, changes vision/speech, left arm numbness and tingling and jaw pain.  Cholesterol Currently at LDL goal; diet for trigs emphasized Continue low cholesterol diet and exercise.  Check lipid panel.   Other abnormal glucose Recent A1Cs at goal Discussed diet/exercise, weight management  Defer A1C; check BMP  Obesity with co morbidities Long discussion about weight loss, diet, and exercise Recommended diet heavy in fruits and veggies and low in animal meats, cheeses, and dairy products, appropriate calorie intake Discussed ideal weight for height Will follow up in 3 months  Vitamin D Def At goal at last visit; continue supplementation to maintain goal of 70-100 Defer Vit D level  Hypogonadism-  continue to monitor, states medication is helping with symptoms of low T.   Elevated LFTs Hepatitis panel has been checked and negative Check LFT, GGT, discussed abdominal US today Low glycemic index diet and fatty liver discussed  Continue diet and meds as discussed. Further disposition pending results of labs. Discussed med's effects and SE's.   Over 30 minutes of exam, counseling, chart review, and critical decision making was performed.   Future Appointments  Date Time Provider [place]  01/23/2018  9:00 AM Unk Pinto, MD [facility] None    ----------------------------------------------------------------------------------------------------------------------  HPI 57 y.o. male  presents for 3 month follow up on hypertension, cholesterol, glucose management, obesity, hypogonadism and vitamin D deficiency. He is followed by Dr. Dossie Der. He  has had elevated LFTs, hep neg - no imaging.   BMI is Body mass index is 35.15 kg/m., he has not been working on diet and exercise, but does a lot of yardwork. He is prescribed phentermine but hasn't taken recent drug break.  Wt Readings from Last 3 Encounters:  07/02/17 245 lb (111.1 kg)  01/29/17 226 lb 9.6 oz (102.8 kg)  12/27/16 229 lb 12.8 oz (104.2 kg)   He does not check BP at home, today their BP is BP: (!) 158/100 he admits he was running late and had 2 large cups of coffee this AM. Manual recheck by provider by end of visit was 138/84.   He does not workout. He denies chest pain, shortness of breath, dizziness.   He is on cholesterol medication (rosuvastatin 20 mg on MWF) and denies myalgias. His LDL cholesterol is at goal; his trigs remain slightly elevated. The cholesterol last visit was:  Lab Results  Component Value Date   CHOL 144 01/29/2017   HDL 44 01/29/2017   LDLCALC 74 01/29/2017   TRIG 166 (H) 01/29/2017   CHOLHDL 3.3 01/29/2017    He has not been working on diet and exercise for glucose management, and denies foot ulcerations, increased appetite, nausea, paresthesia of the feet, polydipsia, polyuria, visual disturbances, vomiting and weight loss. Last A1C in the office was:  Lab Results  Component Value Date   HGBA1C 5.1 12/27/2016   Patient is on Vitamin D supplement and at goal at last check:    Lab Results  Component Value Date   VD25OH 84 12/27/2016     He has a history of testosterone deficiency and is on testosterone replacement. He states that the testosterone helps with his energy,  libido, muscle mass. Lab Results  Component Value Date   TESTOSTERONE 1,333 (H) 12/27/2016     Current Medications:  Current Outpatient Medications on File Prior to Visit  Medication Sig  . allopurinol (ZYLOPRIM) 300 MG tablet Take 300 mg by mouth daily.   Marland Kitchen aspirin EC 81 MG tablet Take 81 mg by mouth daily.  . B-D 3CC LUER-LOK SYR 21GX1" 21G X 1" 3 ML MISC USE AS  DIRECTED WITH TESTOSTERONE INJECTION EVERY 14 DAYS  . bisoprolol-hydrochlorothiazide (ZIAC) 5-6.25 MG tablet Take 1 tablet every morning  for BP  . Cholecalciferol (VITAMIN D PO) Take 5,000 Units by mouth 2 (two) times daily.  Scarlette Shorts SURECLICK 50 MG/ML injection   . finasteride (PROSCAR) 5 MG tablet Take 1 tablet daily for Prostate  . fluocinonide ointment (LIDEX) 0.05 %   . lisinopril (PRINIVIL,ZESTRIL) 20 MG tablet Take 1 tablet at bedtime for BP  . Magnesium 500 MG TABS Take 1 tablet by mouth daily.  . Multiple Vitamin (MULTIVITAMIN) tablet Take 1 tablet by mouth daily.  . Omega-3 Fatty Acids (FISH OIL) 1200 MG CAPS Take by mouth 2 (two) times daily.  . phentermine (ADIPEX-P) 37.5 MG tablet TAKE 1/2 TO 1 TABLET BY MOUTH DAILY FOR DIETING AND WEIGHT LOSS  . rosuvastatin (CRESTOR) 40 MG tablet Take 1/2 to 1 tablet daily or as directed for Cholesterol  . testosterone cypionate (DEPOTESTOSTERONE CYPIONATE) 200 MG/ML injection ADMINISTER 2 ML IN THE MUSCLE EVERY 2 WEEKS  . vitamin B-12 (CYANOCOBALAMIN) 1000 MCG tablet Take 1,000 mcg by mouth daily.  . vitamin C (ASCORBIC ACID) 500 MG tablet Take 500 mg by mouth 2 (two) times daily.  . Zinc 50 MG TABS Take 1 tablet by mouth daily.   No current facility-administered medications on file prior to visit.      Allergies: No Known Allergies   Medical History:  Past Medical History:  Diagnosis Date  . Arthritis    [place] Polyarthritis  . Elevated hemoglobin A1c measurement   . Gout   . Hyperlipidemia   . Hypertension    Family history- Reviewed and unchanged Social history- Reviewed and unchanged   Review of Systems:  Review of Systems  Constitutional: Negative for malaise/fatigue and weight loss.  HENT: Negative for hearing loss and tinnitus.   Eyes: Negative for blurred vision and double vision.  Respiratory: Negative for cough, shortness of breath and wheezing.   Cardiovascular: Negative for chest pain, palpitations, orthopnea,  claudication and leg swelling.  Gastrointestinal: Negative for abdominal pain, blood in stool, constipation, diarrhea, heartburn, melena, nausea and vomiting.  Genitourinary: Negative.   Musculoskeletal: Negative for joint pain and myalgias.  Skin: Negative for rash.  Neurological: Negative for dizziness, tingling, sensory change, weakness and headaches.  Endo/Heme/Allergies: Negative for polydipsia.  Psychiatric/Behavioral: Negative.   All other systems reviewed and are negative.   Physical Exam: BP (!) 158/100   Pulse 85   Temp 97.7 F (36.5 C)   Ht 5\' 10"  (1.778 m)   Wt 245 lb (111.1 kg)   SpO2 96%   BMI 35.15 kg/m  Wt Readings from Last 3 Encounters:  07/02/17 245 lb (111.1 kg)  01/29/17 226 lb 9.6 oz (102.8 kg)  12/27/16 229 lb 12.8 oz (104.2 kg)   General Appearance: Well nourished, in no apparent distress. Eyes: PERRLA, EOMs, conjunctiva no swelling or erythema Sinuses: No Frontal/maxillary tenderness ENT/Mouth: Ext aud canals clear, TMs without erythema, bulging. No erythema, swelling, or exudate on post pharynx.  Tonsils not swollen  or erythematous. Hearing normal.  Neck: Supple, thyroid normal.  Respiratory: Respiratory effort normal, BS equal bilaterally without rales, rhonchi, wheezing or stridor.  Cardio: RRR with no MRGs. Brisk peripheral pulses without edema.  Abdomen: Soft, + BS.  Non tender, no guarding, rebound, hernias, masses. Lymphatics: Non tender without lymphadenopathy.  Musculoskeletal: Full ROM, 5/5 strength, Normal gait Skin: Warm, dry without rashes, lesions, ecchymosis.  Neuro: Cranial nerves intact. No cerebellar symptoms.  Psych: Awake and oriented X 3, normal affect, Insight and Judgment appropriate.    Izora Ribas, NP 9:23 AM [facility]

## 2017-07-02 ENCOUNTER — Encounter: Payer: Self-pay | Admitting: Adult Health

## 2017-07-02 ENCOUNTER — Ambulatory Visit (INDEPENDENT_AMBULATORY_CARE_PROVIDER_SITE_OTHER): Payer: 59 | Admitting: Adult Health

## 2017-07-02 VITALS — BP 138/84 | HR 85 | Temp 97.7°F | Ht 70.0 in | Wt 245.0 lb

## 2017-07-02 DIAGNOSIS — Z79899 Other long term (current) drug therapy: Secondary | ICD-10-CM

## 2017-07-02 DIAGNOSIS — E559 Vitamin D deficiency, unspecified: Secondary | ICD-10-CM | POA: Diagnosis not present

## 2017-07-02 DIAGNOSIS — I10 Essential (primary) hypertension: Secondary | ICD-10-CM | POA: Diagnosis not present

## 2017-07-02 DIAGNOSIS — E782 Mixed hyperlipidemia: Secondary | ICD-10-CM

## 2017-07-02 DIAGNOSIS — E669 Obesity, unspecified: Secondary | ICD-10-CM | POA: Diagnosis not present

## 2017-07-02 DIAGNOSIS — E291 Testicular hypofunction: Secondary | ICD-10-CM

## 2017-07-02 DIAGNOSIS — R945 Abnormal results of liver function studies: Secondary | ICD-10-CM | POA: Diagnosis not present

## 2017-07-02 DIAGNOSIS — M1000 Idiopathic gout, unspecified site: Secondary | ICD-10-CM

## 2017-07-02 DIAGNOSIS — R7309 Other abnormal glucose: Secondary | ICD-10-CM | POA: Diagnosis not present

## 2017-07-02 DIAGNOSIS — R7989 Other specified abnormal findings of blood chemistry: Secondary | ICD-10-CM

## 2017-07-02 NOTE — Patient Instructions (Addendum)
Please call if BP is consistently running above 130/80    Monitor your blood pressure at home, please keep a record and bring that in with you to your next office visit.   Go to the ER if any CP, SOB, nausea, dizziness, severe HA, changes vision/speech  Due to a recent study, SPRINT, we have changed our goal for the systolic or top blood pressure number. Ideally we want your top number at 120.  In the [facility] Trial, 5000 people were randomized to a goal BP of 120 and 5000 people were randomized to a goal BP of less than 140. The patients with the goal BP at 120 had LESS DEMENTIA, LESS HEART ATTACKS, AND LESS STROKES, AS WELL AS OVERALL DECREASED MORTALITY OR DEATH RATE.   If you are willing, our goal BP is the top number of 120.  Your most recent BP: BP: 138/84   Take your medications faithfully as instructed. Maintain a healthy weight. Get at least 150 minutes of aerobic exercise per week. Minimize salt intake. Minimize alcohol intake  DASH Eating Plan DASH stands for "Dietary Approaches to Stop Hypertension." The DASH eating plan is a healthy eating plan that has been shown to reduce high blood pressure (hypertension). Additional health benefits may include reducing the risk of type 2 diabetes mellitus, heart disease, and stroke. The DASH eating plan may also help with weight loss. WHAT DO I NEED TO KNOW ABOUT THE DASH EATING PLAN? For the DASH eating plan, you will follow these general guidelines:  Choose foods with a percent daily value for sodium of less than 5% (as listed on the food label).  Use salt-free seasonings or herbs instead of table salt or sea salt.  Check with your health care provider or pharmacist before using salt substitutes.  Eat lower-sodium products, often labeled as "lower sodium" or "no salt added."  Eat fresh foods.  Eat more vegetables, fruits, and low-fat dairy products.  Choose whole grains. Look for the word "whole" as the first word in the  ingredient list.  Choose fish and skinless chicken or [place] more often than red meat. Limit fish, poultry, and meat to 6 oz (170 g) each day.  Limit sweets, desserts, sugars, and sugary drinks.  Choose heart-healthy fats.  Limit cheese to 1 oz (28 g) per day.  Eat more home-cooked food and less restaurant, buffet, and fast food.  Limit fried foods.  Cook foods using methods other than frying.  Limit canned vegetables. If you do use them, rinse them well to decrease the sodium.  When eating at a restaurant, ask that your food be prepared with less salt, or no salt if possible. WHAT FOODS CAN I EAT? Seek help from a dietitian for individual calorie needs. Grains Whole grain or whole wheat bread. Brown rice. Whole grain or whole wheat pasta. Quinoa, bulgur, and whole grain cereals. Low-sodium cereals. Corn or whole wheat flour tortillas. Whole grain cornbread. Whole grain crackers. Low-sodium crackers. Vegetables Fresh or frozen vegetables (raw, steamed, roasted, or grilled). Low-sodium or reduced-sodium tomato and vegetable juices. Low-sodium or reduced-sodium tomato sauce and paste. Low-sodium or reduced-sodium canned vegetables.  Fruits All fresh, canned (in natural juice), or frozen fruits. Meat and Other Protein Products Ground beef (85% or leaner), grass-fed beef, or beef trimmed of fat. Skinless chicken or [place]. Ground chicken or [place]. Pork trimmed of fat. All fish and seafood. Eggs. Dried beans, peas, or lentils. Unsalted nuts and seeds. Unsalted canned beans. Dairy Low-fat dairy products, such  as skim or 1% milk, 2% or reduced-fat cheeses, low-fat ricotta or cottage cheese, or plain low-fat yogurt. Low-sodium or reduced-sodium cheeses. Fats and Oils Tub margarines without trans fats. Light or reduced-fat mayonnaise and salad dressings (reduced sodium). Avocado. Safflower, olive, or canola oils. Natural peanut or almond butter. Other Unsalted popcorn and pretzels. The  items listed above may not be a complete list of recommended foods or beverages. Contact your dietitian for more options. WHAT FOODS ARE NOT RECOMMENDED? Grains White bread. White pasta. White rice. Refined cornbread. Bagels and croissants. Crackers that contain trans fat. Vegetables Creamed or fried vegetables. Vegetables in a cheese sauce. Regular canned vegetables. Regular canned tomato sauce and paste. Regular tomato and vegetable juices. Fruits Dried fruits. Canned fruit in light or heavy syrup. Fruit juice. Meat and Other Protein Products Fatty cuts of meat. Ribs, chicken wings, bacon, sausage, bologna, salami, chitterlings, fatback, hot dogs, bratwurst, and packaged luncheon meats. Salted nuts and seeds. Canned beans with salt. Dairy Whole or 2% milk, cream, half-and-half, and cream cheese. Whole-fat or sweetened yogurt. Full-fat cheeses or blue cheese. Nondairy creamers and whipped toppings. Processed cheese, cheese spreads, or cheese curds. Condiments Onion and garlic salt, seasoned salt, table salt, and sea salt. Canned and packaged gravies. Worcestershire sauce. Tartar sauce. Barbecue sauce. Teriyaki sauce. Soy sauce, including reduced sodium. Steak sauce. Fish sauce. Oyster sauce. Cocktail sauce. Horseradish. Ketchup and mustard. Meat flavorings and tenderizers. Bouillon cubes. Hot sauce. Tabasco sauce. Marinades. Taco seasonings. Relishes. Fats and Oils Butter, stick margarine, lard, shortening, ghee, and bacon fat. Coconut, palm kernel, or palm oils. Regular salad dressings. Other Pickles and olives. Salted popcorn and pretzels. The items listed above may not be a complete list of foods and beverages to avoid. Contact your dietitian for more information. WHERE CAN I FIND MORE INFORMATION? National Heart, Lung, and Blood Institute: travelstabloid.com Document Released: 12/22/2010 Document Revised: 05/19/2013 Document Reviewed: 11/06/2012 [facility]  Patient Information 2015 [place], [place]. This information is not intended to replace advice given to you by your health care provider. Make sure you discuss any questions you have with your health care provider.        Fatty liver or Nonalcoholic fatty liver disease (NASH) is now the leading cause of liver failure in the [place]. It is normally from such risk factors as obesity, diabetes, insulin resistance, high cholesterol, or metabolic syndrome. The only definitive therapy is weight loss and exercise.  Suggest walking 20-30 mins daily.  Decreasing carbohydrates, increasing veggies.  Vitamin E 800 IU a day may be beneficial. Liver cancer has been noted in patient with fatty liver without cirrhosis.  Will monitor closely   Fatty Liver Fatty liver is the accumulation of fat in liver cells. It is also called hepatosteatosis or steatohepatitis. It is normal for your liver to contain some fat. If fat is more than 5 to 10% of your liver's weight, you have fatty liver.  There are often no symptoms (problems) for years while damage is still occurring. People often learn about their fatty liver when they have medical tests for other reasons. Fat can damage your liver for years or even decades without causing problems. When it becomes severe, it can cause fatigue, weight loss, weakness, and confusion. This makes you more likely to develop more serious liver problems. The liver is the largest organ in the body. It does a lot of work and often gives no warning signs when it is sick until late in a disease. The liver has many  important jobs including:  Breaking down foods.  Storing vitamins, iron, and other minerals.  Making proteins.  Making bile for food digestion.  Breaking down many products including medications, alcohol and some poisons.  PROGNOSIS  Fatty liver may cause no damage or it can lead to an inflammation of the liver. This is, called steatohepatitis.  Over time the liver  may become scarred and hardened. This condition is called cirrhosis. Cirrhosis is serious and may lead to liver failure or cancer. NASH is one of the leading causes of cirrhosis. About 10-20% of Americans have fatty liver and a smaller 2-5% has NASH.  TREATMENT   Weight loss, fat restriction, and exercise in overweight patients produces inconsistent results but is worth trying.  Good control of diabetes may reduce fatty liver.  Eat a balanced, healthy diet.  Increase your physical activity.  There are no medical or surgical treatments for a fatty liver or NASH, but improving your diet and increasing your exercise may help prevent or reverse some of the damage.

## 2017-10-16 LAB — CBC WITH DIFFERENTIAL/PLATELET
Basophils Absolute: 83 cells/uL (ref 0–200)
Basophils Relative: 1.1 %
EOS ABS: 660 {cells}/uL — AB (ref 15–500)
Eosinophils Relative: 8.8 %
HCT: 47.9 % (ref 38.5–50.0)
Hemoglobin: 16.6 g/dL (ref 13.2–17.1)
Lymphs Abs: 2175 cells/uL (ref 850–3900)
MCH: 31.2 pg (ref 27.0–33.0)
MCHC: 34.7 g/dL (ref 32.0–36.0)
MCV: 90 fL (ref 80.0–100.0)
MONOS PCT: 13.8 %
MPV: 10.6 fL (ref 7.5–12.5)
NEUTROS ABS: 3548 {cells}/uL (ref 1500–7800)
Neutrophils Relative %: 47.3 %
Platelets: 231 10*3/uL (ref 140–400)
RBC: 5.32 10*6/uL (ref 4.20–5.80)
RDW: 13.7 % (ref 11.0–15.0)
Total Lymphocyte: 29 %
WBC mixed population: 1035 cells/uL — ABNORMAL HIGH (ref 200–950)
WBC: 7.5 10*3/uL (ref 3.8–10.8)

## 2017-10-16 LAB — LIPID PANEL
CHOLESTEROL: 161 mg/dL (ref <200)
HDL: 53 mg/dL (ref >40)
LDL CHOLESTEROL (CALC): 84 mg/dL
Non-HDL Cholesterol (Calc): 108 mg/dL (calc) (ref <130)
Total CHOL/HDL Ratio: 3 (calc) (ref <5.0)
Triglycerides: 137 mg/dL (ref <150)

## 2017-10-16 LAB — COMPLETE METABOLIC PANEL WITH GFR
AG RATIO: 1.7 (calc) (ref 1.0–2.5)
ALBUMIN MSPROF: 4.4 g/dL (ref 3.6–5.1)
ALKALINE PHOSPHATASE (APISO): 71 U/L (ref 40–115)
ALT: 58 U/L — ABNORMAL HIGH (ref 9–46)
AST: 34 U/L (ref 10–35)
BILIRUBIN TOTAL: 1 mg/dL (ref 0.2–1.2)
BUN: 16 mg/dL (ref 7–25)
CHLORIDE: 103 mmol/L (ref 98–110)
CO2: 31 mmol/L (ref 20–32)
Calcium: 9.4 mg/dL (ref 8.6–10.3)
Creat: 1.15 mg/dL (ref 0.70–1.33)
GFR, EST AFRICAN AMERICAN: 82 mL/min/{1.73_m2} (ref >=60)
GFR, Est Non African American: 71 mL/min/{1.73_m2} (ref >=60)
GLOBULIN: 2.6 g/dL (ref 1.9–3.7)
GLUCOSE: 77 mg/dL (ref 65–99)
POTASSIUM: 4.7 mmol/L (ref 3.5–5.3)
Sodium: 140 mmol/L (ref 135–146)
TOTAL PROTEIN: 7 g/dL (ref 6.1–8.1)

## 2017-10-16 LAB — TSH: TSH: 2.23 m[IU]/L (ref 0.40–4.50)

## 2017-10-16 LAB — GAMMA GT: GGT: 29 U/L (ref 3–85)

## 2017-12-10 ENCOUNTER — Other Ambulatory Visit: Payer: Self-pay | Admitting: Internal Medicine

## 2017-12-10 DIAGNOSIS — E349 Endocrine disorder, unspecified: Secondary | ICD-10-CM

## 2017-12-19 ENCOUNTER — Encounter: Payer: Self-pay | Admitting: Internal Medicine

## 2018-01-01 ENCOUNTER — Other Ambulatory Visit: Payer: Self-pay | Admitting: Internal Medicine

## 2018-01-22 ENCOUNTER — Encounter: Payer: Self-pay | Admitting: Internal Medicine

## 2018-01-22 NOTE — Progress Notes (Signed)
[facility]   Unk Pinto, M.D.     [name], P.A.-C Liane Comber, [place]                [address] [place], [place] [address] Telephone [phone] Telefax [phone] Annual  Screening/Preventative Visit  & Comprehensive Evaluation & Examination     This very nice 58 y.o. MWM presents for a Screening /Preventative Visit & comprehensive evaluation and management of multiple medical co-morbidities.  Patient has been followed for HTN, HLD, Prediabetes and Vitamin D Deficiency. Patient has hx/o Gout & is followed by Dr Dossie Der.      HTN predates circa 2008, altho only started Treatment in 2014.  Patient's BP has been controlled at home.  Today's BP is at goal - 140/84. Patient denies any cardiac symptoms as chest pain, palpitations, shortness of breath, dizziness or ankle swelling.     Patient's hyperlipidemia is controlled with diet and medications. Patient denies myalgias or other medication SE's. Last lipids were at goal: Lab Results  Component Value Date   CHOL 161 07/02/2017   HDL 53 07/02/2017   LDLCALC 84 07/02/2017   TRIG 137 07/02/2017   CHOLHDL 3.0 07/02/2017      Patient has hx/o prediabetes / Insulin Resistance   (A1c 5.6% w/ elevated Insulin 139 - 2014 & A1c 6.0% / Insulin 116 - 2016)   and patient denies reactive hypoglycemic symptoms, visual blurring, diabetic polys or paresthesias. Patient has lost weight from 284# in 2013 to current weight of   Last A1c was Normal & at goal: Lab Results  Component Value Date   HGBA1C 5.1 12/27/2016       Patient has hx/o low Testosterone ("178" in Oct 2017 betw shots)  and is on replacement therapy.      Finally, patient has history of Vitamin D Deficiency ("36" / 2014)  and last vitamin D was at goal: Lab Results  Component Value Date   VD25OH 84 12/27/2016   Current Outpatient Medications on File Prior to Visit  Medication  Sig  . allopurinol (ZYLOPRIM) 300 MG tablet Take 300 mg by mouth daily.   Marland Kitchen aspirin EC 81 MG tablet Take 81 mg by mouth daily.  . B-D 3CC LUER-LOK SYR 21GX1" 21G X 1" 3 ML MISC USE AS DIRECTED WITH TESTOSTERONE INJECTION EVERY 14 DAYS  . Cholecalciferol (VITAMIN D PO) Take 5,000 Units by mouth 2 (two) times daily.  Scarlette Shorts SURECLICK 50 MG/ML injection   . finasteride (PROSCAR) 5 MG tablet Take 1 tablet daily for Prostate  . fluocinonide ointment (LIDEX) 0.05 %   . Magnesium 500 MG TABS Take 1 tablet by mouth daily.  . Multiple Vitamin (MULTIVITAMIN) tablet Take 1 tablet by mouth daily.  . Omega-3 Fatty Acids (FISH OIL) 1200 MG CAPS Take by mouth 2 (two) times daily.  . rosuvastatin (CRESTOR) 40 MG tablet Take 1/2 to 1 tablet daily or as directed for Cholesterol  . testosterone cypionate (DEPOTESTOSTERONE CYPIONATE) 200 MG/ML injection ADMINISTER 2 ML IN THE MUSCLE EVERY 2 WEEKS  . vitamin B-12 (CYANOCOBALAMIN) 1000 MCG tablet Take 1,000 mcg by mouth daily.  . vitamin C (ASCORBIC ACID) 500 MG tablet Take 500 mg by mouth 2 (two) times daily.  . Zinc 50 MG TABS Take 1 tablet by mouth daily.  . bisoprolol-hydrochlorothiazide (ZIAC) 5-6.25 MG tablet Take 1 tablet  every morning  for BP  . lisinopril (PRINIVIL,ZESTRIL) 20 MG tablet Take 1 tablet at bedtime for BP   No current facility-administered medications on file prior to visit.    No Known Allergies   Past Medical History:  Diagnosis Date  . Arthritis    [place] Polyarthritis  . Elevated hemoglobin A1c measurement   . Gout   . Hyperlipidemia   . Hypertension    Health Maintenance  Topic Date Due  . INFLUENZA VACCINE  08/17/2018 (Originally 08/16/2017)  . Fecal DNA (Cologuard)  02/16/2020  . TETANUS/TDAP  10/04/2024  . Hepatitis C Screening  Completed  . HIV Screening  Completed   Immunization History  Administered Date(s) Administered  . PPD Test 06/23/2013, 10/05/2014, 10/20/2015, 12/27/2016, 01/23/2018  . Tdap 10/05/2014    Last Colon -  History reviewed. No pertinent surgical history. History reviewed. No pertinent family history. Social History   Socioeconomic History  . Marital status: Married    Spouse name: Not on file  . Number of children: Not on file  Occupational History  . Works for [employer]  Tobacco Use  . Smoking status: Never Smoker  . Smokeless tobacco: Never Used  Substance and Sexual Activity  . Alcohol use: Yes    Comment: rarely  . Drug use: No  . Sexual activity: Yes    ROS Constitutional: Denies fever, chills, weight loss/gain, headaches, insomnia,  night sweats or change in appetite. Does c/o fatigue. Eyes: Denies redness, blurred vision, diplopia, discharge, itchy or watery eyes.  ENT: Denies discharge, congestion, post nasal drip, epistaxis, sore throat, earache, hearing loss, dental pain, Tinnitus, Vertigo, Sinus pain or snoring.  Cardio: Denies chest pain, palpitations, irregular heartbeat, syncope, dyspnea, diaphoresis, orthopnea, PND, claudication or edema Respiratory: denies cough, dyspnea, DOE, pleurisy, hoarseness, laryngitis or wheezing.  Gastrointestinal: Denies dysphagia, heartburn, reflux, water brash, pain, cramps, nausea, vomiting, bloating, diarrhea, constipation, hematemesis, melena, hematochezia, jaundice or hemorrhoids Genitourinary: Denies dysuria, frequency, urgency, nocturia, hesitancy, discharge, hematuria or flank pain Musculoskeletal: Denies arthralgia, myalgia, stiffness, Jt. Swelling, pain, limp or strain/sprain. Denies Falls. Skin: Denies puritis, rash, hives, warts, acne, eczema or change in skin lesion Neuro: No weakness, tremor, incoordination, spasms, paresthesia or pain Psychiatric: Denies confusion, memory loss or sensory loss. Denies Depression. Endocrine: Denies change in weight, skin, hair change, nocturia, and paresthesia, diabetic polys, visual blurring or hyper / hypo glycemic episodes.  Heme/Lymph: No excessive bleeding, bruising or  enlarged lymph nodes.  Physical Exam  BP 140/84   Pulse 84   Temp 97.7 F (36.5 C)   Resp 18   Ht 5\' 11"  (1.803 m)   Wt 262 lb (118.8 kg)   BMI 36.54 kg/m   General Appearance: Well nourished and well groomed and in no apparent distress.  Eyes: PERRLA, EOMs, conjunctiva no swelling or erythema, normal fundi and vessels. Sinuses: No frontal/maxillary tenderness ENT/Mouth: EACs patent / TMs  nl. Nares clear without erythema, swelling, mucoid exudates. Oral hygiene is good. No erythema, swelling, or exudate. Tongue normal, non-obstructing. Tonsils not swollen or erythematous. Hearing normal.  Neck: Supple, thyroid not palpable. No bruits, nodes or JVD. Respiratory: Respiratory effort normal.  BS equal and clear bilateral without rales, rhonci, wheezing or stridor. Cardio: Heart sounds are normal with regular rate and rhythm and no murmurs, rubs or gallops. Peripheral pulses are normal and equal bilaterally without edema. No aortic or femoral bruits. Chest: symmetric with normal excursions and percussion.  Abdomen: Soft, with Nl bowel sounds. Nontender, no guarding, rebound, hernias, masses,  or organomegaly.  Lymphatics: Non tender without lymphadenopathy.  Musculoskeletal: Full ROM all peripheral extremities, joint stability, 5/5 strength, and normal gait. Skin: Warm and dry without rashes, lesions, cyanosis, clubbing or  ecchymosis.  Neuro: Cranial nerves intact, reflexes equal bilaterally. Normal muscle tone, no cerebellar symptoms. Sensation intact.  Pysch: Alert and oriented X 3 with normal affect, insight and judgment appropriate.   Assessment and Plan  1. Annual Preventative/Screening Exam   2. Essential hypertension  - EKG 12-Lead - [place], RETROPERITNL ABD,  LTD - Urinalysis, Routine w reflex microscopic - Microalbumin / creatinine urine ratio - CBC with Differential/Platelet - COMPLETE METABOLIC PANEL WITH GFR - Magnesium - TSH  3. Hyperlipidemia, mixed  - EKG  12-Lead - [place], RETROPERITNL ABD,  LTD - Lipid panel - TSH  4. PreDiabetes  - EKG 12-Lead - [place], RETROPERITNL ABD,  LTD - Hemoglobin A1c - Insulin, random  5. Vitamin D deficiency  - VITAMIN D 25 Hydroxy  6. Idiopathic gout  - Uric acid  7. Testosterone deficiency  - Testosterone  8. Vitamin B 12 deficiency  - Vitamin B12  9. Screening examination for pulmonary tuberculosis  - TB Skin Test  10. Screening for colorectal cancer  - POC Hemoccult Bld/Stl  11. Prostate cancer screening  - PSA  12. Screening for ischemic heart disease  - EKG 12-Lead  13. FH: hypertension  - EKG 12-Lead - [place], RETROPERITNL ABD,  LTD  14. Screening for AAA (aortic abdominal aneurysm)  - [place], RETROPERITNL ABD,  LTD  15. Morbid obesity (BMI 37.49)   - phentermine (ADIPEX-P) 37.5 MG tablet; Take 1 tablet every morning for Dieting & Weight Loss  Dispense: 30 tablet; Refill: 5 - topiramate (TOPAMAX) 50 MG tablet; Take 1 tablet 2 x / day at Suppertime & Bedtime  Dispense: 180 tablet; Refill: 1  16. Fatigue, unspecified type  - Iron,Total/Total Iron Binding Cap - Vitamin B12 - CBC with Differential/Platelet - VITAMIN D 25 Hydroxy (Vit-D Deficiency, Fractures)  17. Medication management  - Urinalysis, Routine w reflex microscopic - Microalbumin / creatinine urine ratio - Testosterone - Uric acid - CBC with Differential/Platelet - COMPLETE METABOLIC PANEL WITH GFR - Magnesium - Lipid panel - TSH - Hemoglobin A1c - Insulin, random - VITAMIN D 25 Hydroxy       Patient was counseled in prudent diet, weight control to achieve/maintain BMI less than 25, BP monitoring, regular exercise and medications as discussed.  Discussed med effects and SE's. Routine screening labs and tests as requested with regular follow-up as recommended. Over 40 minutes of exam, counseling, chart review and high complex critical decision making was performed

## 2018-01-22 NOTE — Patient Instructions (Signed)

## 2018-01-23 ENCOUNTER — Ambulatory Visit (INDEPENDENT_AMBULATORY_CARE_PROVIDER_SITE_OTHER): Payer: BLUE CROSS/BLUE SHIELD | Admitting: Internal Medicine

## 2018-01-23 VITALS — BP 140/84 | HR 84 | Temp 97.7°F | Resp 18 | Ht 71.0 in | Wt 262.0 lb

## 2018-01-23 DIAGNOSIS — Z136 Encounter for screening for cardiovascular disorders: Secondary | ICD-10-CM | POA: Diagnosis not present

## 2018-01-23 DIAGNOSIS — Z0000 Encounter for general adult medical examination without abnormal findings: Secondary | ICD-10-CM | POA: Diagnosis not present

## 2018-01-23 DIAGNOSIS — I10 Essential (primary) hypertension: Secondary | ICD-10-CM

## 2018-01-23 DIAGNOSIS — R350 Frequency of micturition: Secondary | ICD-10-CM

## 2018-01-23 DIAGNOSIS — Z8249 Family history of ischemic heart disease and other diseases of the circulatory system: Secondary | ICD-10-CM

## 2018-01-23 DIAGNOSIS — E349 Endocrine disorder, unspecified: Secondary | ICD-10-CM

## 2018-01-23 DIAGNOSIS — E559 Vitamin D deficiency, unspecified: Secondary | ICD-10-CM

## 2018-01-23 DIAGNOSIS — Z125 Encounter for screening for malignant neoplasm of prostate: Secondary | ICD-10-CM

## 2018-01-23 DIAGNOSIS — Z13220 Encounter for screening for lipoid disorders: Secondary | ICD-10-CM | POA: Diagnosis not present

## 2018-01-23 DIAGNOSIS — E538 Deficiency of other specified B group vitamins: Secondary | ICD-10-CM

## 2018-01-23 DIAGNOSIS — Z131 Encounter for screening for diabetes mellitus: Secondary | ICD-10-CM

## 2018-01-23 DIAGNOSIS — Z1212 Encounter for screening for malignant neoplasm of rectum: Secondary | ICD-10-CM

## 2018-01-23 DIAGNOSIS — Z79899 Other long term (current) drug therapy: Secondary | ICD-10-CM | POA: Diagnosis not present

## 2018-01-23 DIAGNOSIS — Z111 Encounter for screening for respiratory tuberculosis: Secondary | ICD-10-CM | POA: Diagnosis not present

## 2018-01-23 DIAGNOSIS — E782 Mixed hyperlipidemia: Secondary | ICD-10-CM

## 2018-01-23 DIAGNOSIS — Z1329 Encounter for screening for other suspected endocrine disorder: Secondary | ICD-10-CM | POA: Diagnosis not present

## 2018-01-23 DIAGNOSIS — Z0001 Encounter for general adult medical examination with abnormal findings: Secondary | ICD-10-CM

## 2018-01-23 DIAGNOSIS — N401 Enlarged prostate with lower urinary tract symptoms: Secondary | ICD-10-CM

## 2018-01-23 DIAGNOSIS — R7309 Other abnormal glucose: Secondary | ICD-10-CM

## 2018-01-23 DIAGNOSIS — Z1211 Encounter for screening for malignant neoplasm of colon: Secondary | ICD-10-CM

## 2018-01-23 DIAGNOSIS — M1000 Idiopathic gout, unspecified site: Secondary | ICD-10-CM | POA: Diagnosis not present

## 2018-01-23 DIAGNOSIS — Z1389 Encounter for screening for other disorder: Secondary | ICD-10-CM | POA: Diagnosis not present

## 2018-01-23 DIAGNOSIS — R5383 Other fatigue: Secondary | ICD-10-CM

## 2018-01-23 DIAGNOSIS — Z130 Encounter for screening for diseases of the blood and blood-forming organs and certain disorders involving the immune mechanism: Secondary | ICD-10-CM

## 2018-01-23 MED ORDER — PHENTERMINE HCL 37.5 MG PO TABS: ORAL_TABLET | ORAL | 5 refills | Status: DC

## 2018-01-23 MED ORDER — TOPIRAMATE 50 MG PO TABS: ORAL_TABLET | ORAL | 1 refills | Status: DC

## 2018-01-24 LAB — CBC WITH DIFFERENTIAL/PLATELET
Absolute Monocytes: 1017 cells/uL — ABNORMAL HIGH (ref 200–950)
Basophils Absolute: 99 cells/uL (ref 0–200)
Basophils Relative: 1.1 %
Eosinophils Absolute: 612 cells/uL — ABNORMAL HIGH (ref 15–500)
Eosinophils Relative: 6.8 %
HCT: 52.5 % — ABNORMAL HIGH (ref 38.5–50.0)
Hemoglobin: 17.9 g/dL — ABNORMAL HIGH (ref 13.2–17.1)
Lymphs Abs: 2322 cells/uL (ref 850–3900)
MCH: 31.3 pg (ref 27.0–33.0)
MCHC: 34.1 g/dL (ref 32.0–36.0)
MCV: 91.9 fL (ref 80.0–100.0)
MONOS PCT: 11.3 %
MPV: 10.6 fL (ref 7.5–12.5)
NEUTROS PCT: 55 %
Neutro Abs: 4950 cells/uL (ref 1500–7800)
PLATELETS: 260 10*3/uL (ref 140–400)
RBC: 5.71 10*6/uL (ref 4.20–5.80)
RDW: 14.1 % (ref 11.0–15.0)
Total Lymphocyte: 25.8 %
WBC: 9 10*3/uL (ref 3.8–10.8)

## 2018-01-24 LAB — URINALYSIS, ROUTINE W REFLEX MICROSCOPIC
BILIRUBIN URINE: NEGATIVE
GLUCOSE, UA: NEGATIVE
Hgb urine dipstick: NEGATIVE
KETONES UR: NEGATIVE
Leukocytes, UA: NEGATIVE
Nitrite: NEGATIVE
Protein, ur: NEGATIVE
SPECIFIC GRAVITY, URINE: 1.011 (ref 1.001–1.03)
pH: 7 (ref 5.0–8.0)

## 2018-01-24 LAB — COMPLETE METABOLIC PANEL WITH GFR
AG Ratio: 1.6 (calc) (ref 1.0–2.5)
ALBUMIN MSPROF: 4.7 g/dL (ref 3.6–5.1)
ALT: 62 U/L — ABNORMAL HIGH (ref 9–46)
AST: 37 U/L — ABNORMAL HIGH (ref 10–35)
Alkaline phosphatase (APISO): 73 U/L (ref 40–115)
BUN: 15 mg/dL (ref 7–25)
CO2: 31 mmol/L (ref 20–32)
Calcium: 9.7 mg/dL (ref 8.6–10.3)
Chloride: 101 mmol/L (ref 98–110)
Creat: 1.19 mg/dL (ref 0.70–1.33)
GFR, Est African American: 78 mL/min/{1.73_m2} (ref >=60)
GFR, Est Non African American: 67 mL/min/{1.73_m2} (ref >=60)
Globulin: 2.9 g/dL (calc) (ref 1.9–3.7)
Glucose, Bld: 77 mg/dL (ref 65–99)
POTASSIUM: 4.4 mmol/L (ref 3.5–5.3)
SODIUM: 140 mmol/L (ref 135–146)
Total Bilirubin: 0.9 mg/dL (ref 0.2–1.2)
Total Protein: 7.6 g/dL (ref 6.1–8.1)

## 2018-01-24 LAB — INSULIN, RANDOM: Insulin: 10.2 u[IU]/mL (ref 2.0–19.6)

## 2018-01-24 LAB — LIPID PANEL
Cholesterol: 193 mg/dL (ref <200)
HDL: 51 mg/dL (ref >40)
LDL Cholesterol (Calc): 121 mg/dL (calc) — ABNORMAL HIGH
NON-HDL CHOLESTEROL (CALC): 142 mg/dL — AB (ref <130)
Total CHOL/HDL Ratio: 3.8 (calc) (ref <5.0)
Triglycerides: 103 mg/dL (ref <150)

## 2018-01-24 LAB — MICROALBUMIN / CREATININE URINE RATIO
Creatinine, Urine: 57 mg/dL (ref 20–320)
Microalb Creat Ratio: 12 mcg/mg creat (ref <30)
Microalb, Ur: 0.7 mg/dL

## 2018-01-24 LAB — VITAMIN D 25 HYDROXY (VIT D DEFICIENCY, FRACTURES): Vit D, 25-Hydroxy: 70 ng/mL (ref 30–100)

## 2018-01-24 LAB — IRON, TOTAL/TOTAL IRON BINDING CAP
%SAT: 36 % (ref 20–48)
Iron: 138 ug/dL (ref 50–180)
TIBC: 384 mcg/dL (calc) (ref 250–425)

## 2018-01-24 LAB — PSA: PSA: 0.3 ng/mL (ref <=4.0)

## 2018-01-24 LAB — TESTOSTERONE: Testosterone: 1914 ng/dL — ABNORMAL HIGH (ref 250–827)

## 2018-01-24 LAB — HEMOGLOBIN A1C
Hgb A1c MFr Bld: 5.4 % of total Hgb (ref <5.7)
Mean Plasma Glucose: 108 (calc)
eAG (mmol/L): 6 (calc)

## 2018-01-24 LAB — MAGNESIUM: Magnesium: 2 mg/dL (ref 1.5–2.5)

## 2018-01-24 LAB — URIC ACID: Uric Acid, Serum: 5 mg/dL (ref 4.0–8.0)

## 2018-01-24 LAB — VITAMIN B12: Vitamin B-12: 720 pg/mL (ref 200–1100)

## 2018-01-24 LAB — TSH: TSH: 2.08 mIU/L (ref 0.40–4.50)

## 2018-01-25 LAB — TB SKIN TEST
Induration: 0 mm
TB Skin Test: NEGATIVE

## 2018-02-02 ENCOUNTER — Other Ambulatory Visit: Payer: Self-pay | Admitting: Internal Medicine

## 2018-02-02 DIAGNOSIS — I10 Essential (primary) hypertension: Secondary | ICD-10-CM

## 2018-02-04 ENCOUNTER — Encounter: Payer: Self-pay | Admitting: Adult Health

## 2018-02-04 ENCOUNTER — Ambulatory Visit
Admission: RE | Admit: 2018-02-04 | Discharge: 2018-02-04 | Disposition: A | Discharge status: Home / Self Care | Payer: BLUE CROSS/BLUE SHIELD | Source: Ambulatory Visit | Attending: Adult Health | Admitting: Adult Health

## 2018-02-04 DIAGNOSIS — K8020 Calculus of gallbladder without cholecystitis without obstruction: Secondary | ICD-10-CM

## 2018-02-04 DIAGNOSIS — R945 Abnormal results of liver function studies: Principal | ICD-10-CM

## 2018-02-04 DIAGNOSIS — R7989 Other specified abnormal findings of blood chemistry: Secondary | ICD-10-CM

## 2018-02-04 HISTORY — DX: Calculus of gallbladder without cholecystitis without obstruction: K80.20

## 2018-02-05 ENCOUNTER — Other Ambulatory Visit: Payer: Self-pay

## 2018-02-05 DIAGNOSIS — E349 Endocrine disorder, unspecified: Secondary | ICD-10-CM

## 2018-02-05 NOTE — Telephone Encounter (Signed)
Refill request for Rosuvastatin and Testosterone

## 2018-02-06 MED ORDER — ROSUVASTATIN CALCIUM 40 MG PO TABS: ORAL_TABLET | ORAL | 1 refills | Status: DC

## 2018-02-06 MED ORDER — TESTOSTERONE CYPIONATE 200 MG/ML IM SOLN: INTRAMUSCULAR | 0 refills | Status: DC

## 2018-02-24 ENCOUNTER — Other Ambulatory Visit: Payer: Self-pay | Admitting: Internal Medicine

## 2018-02-24 DIAGNOSIS — N401 Enlarged prostate with lower urinary tract symptoms: Secondary | ICD-10-CM

## 2018-03-05 ENCOUNTER — Other Ambulatory Visit: Payer: Self-pay

## 2018-03-05 DIAGNOSIS — Z1212 Encounter for screening for malignant neoplasm of rectum: Principal | ICD-10-CM

## 2018-03-05 DIAGNOSIS — Z1211 Encounter for screening for malignant neoplasm of colon: Secondary | ICD-10-CM

## 2018-03-05 LAB — POC HEMOCCULT BLD/STL (HOME/3-CARD/SCREEN)
Card #2 Fecal Occult Blod, POC: NEGATIVE
Card #3 Fecal Occult Blood, POC: NEGATIVE
Fecal Occult Blood, POC: NEGATIVE

## 2018-03-06 DIAGNOSIS — Z1211 Encounter for screening for malignant neoplasm of colon: Secondary | ICD-10-CM | POA: Diagnosis not present

## 2018-04-23 ENCOUNTER — Other Ambulatory Visit: Payer: Self-pay | Admitting: Internal Medicine

## 2018-04-23 ENCOUNTER — Other Ambulatory Visit: Payer: Self-pay | Admitting: *Deleted

## 2018-04-23 DIAGNOSIS — E349 Endocrine disorder, unspecified: Secondary | ICD-10-CM

## 2018-04-23 DIAGNOSIS — I10 Essential (primary) hypertension: Secondary | ICD-10-CM

## 2018-04-23 MED ORDER — PHENTERMINE HCL 37.5 MG PO TABS: ORAL_TABLET | ORAL | 1 refills | Status: DC

## 2018-04-23 MED ORDER — TESTOSTERONE CYPIONATE 200 MG/ML IM SOLN: INTRAMUSCULAR | 1 refills | Status: DC

## 2018-04-23 MED ORDER — "SYRINGE/NEEDLE (DISP) 21G X 1"" 3 ML MISC": 1 refills | Status: DC

## 2018-04-23 MED ORDER — LISINOPRIL 20 MG PO TABS: ORAL_TABLET | ORAL | 1 refills | Status: DC

## 2018-05-06 ENCOUNTER — Ambulatory Visit: Payer: Self-pay | Admitting: Adult Health

## 2018-07-22 ENCOUNTER — Other Ambulatory Visit: Payer: Self-pay | Admitting: Internal Medicine

## 2018-07-22 MED ORDER — PHENTERMINE HCL 37.5 MG PO TABS: ORAL_TABLET | ORAL | 1 refills | Status: DC

## 2018-08-04 ENCOUNTER — Encounter: Payer: Self-pay | Admitting: Internal Medicine

## 2018-08-04 DIAGNOSIS — R7309 Other abnormal glucose: Secondary | ICD-10-CM | POA: Insufficient documentation

## 2018-08-04 NOTE — Progress Notes (Signed)
History of Present Illness:       This very nice 58 y.o. MWM presents for 6 month follow up with HTN, HLD, Pre-Diabetes and Vitamin D Deficiency. Patient is followed by Dr Dossie Der for Gout.      Patient is treated for HTN (2008) & BP has been controlled at home. Today's BP is at goal - 120/76.  Patient report insidious tickle cough occurring over weeks to months (lelt likely ACEi cough from Lisinopril). Patient has had no complaints of any cardiac type chest pain, palpitations, dyspnea / orthopnea / PND, dizziness, claudication, or dependent edema.      Hyperlipidemia is usually controlled with diet & meds. Patient denies myalgias or other med SE's. Last Lipids off of Rosuvastatin were not at goal: Lab Results  Component Value Date   CHOL 193 01/23/2018   HDL 51 01/23/2018   LDLCALC 121 (H) 01/23/2018   TRIG 103 01/23/2018   CHOLHDL 3.8 01/23/2018       Also, the patient has history of PreDiabetes / Insulin Resistance (A1c 5.6% w/ Insulin 139 /2014 & A1c 6.0% / Insulin 116 / 2016)   and he has had no symptoms of reactive hypoglycemia, diabetic polys, paresthesias or visual blurring.  Last A1c was Normal & at goal:  Lab Results  Component Value Date   HGBA1C 5.4 01/23/2018       Patient  is on replacement therapy for  low Testosterone ("178" / Oct 2017).      Further, the patient also has history of Vitamin D Deficiency ("36" / 2014) and supplements vitamin D without any suspected side-effects. Last vitamin D was Lab Results  Component Value Date   VD25OH 67 01/23/2018   Current Outpatient Medications on File Prior to Visit  Medication Sig  . allopurinol (ZYLOPRIM) 300 MG tablet Take 300 mg by mouth daily.   Marland Kitchen aspirin EC 81 MG tablet Take 81 mg by mouth daily.  . bisoprolol-hydrochlorothiazide (ZIAC) 5-6.25 MG tablet TAKE 1 TABLET BY MOUTH EVERY MORNING FOR BLOOD PRESSURE  . Cholecalciferol (VITAMIN D PO) Take 5,000 Units by mouth 2 (two) times daily.  Scarlette Shorts SURECLICK 50  MG/ML injection   . finasteride (PROSCAR) 5 MG tablet TAKE 1 TABLET DAILY FOR    PROSTATE  . fluocinonide ointment (LIDEX) 0.05 %   . Magnesium 500 MG TABS Take 1 tablet by mouth daily.  . Multiple Vitamin (MULTIVITAMIN) tablet Take 1 tablet by mouth daily.  . Omega-3 Fatty Acids (FISH OIL) 1200 MG CAPS Take by mouth 2 (two) times daily.  . phentermine (ADIPEX-P) 37.5 MG tablet Take 1/2 to  tablet every morning for Dieting & Weight Loss  . rosuvastatin (CRESTOR) 40 MG tablet Take 1/2 to 1 tablet daily or as directed for Cholesterol  . SYRINGE-NEEDLE, DISP, 3 ML (B-D 3CC LUER-LOK SYR 21GX1") 21G X 1" 3 ML MISC USE AS DIRECTED WITH TESTOSTERONE INJECTION EVERY 14 DAYS  . testosterone cypionate (DEPOTESTOSTERONE CYPIONATE) 200 MG/ML injection ADMINISTER 2 ML IN THE MUSCLE EVERY 2 WEEKS  . vitamin B-12 (CYANOCOBALAMIN) 1000 MCG tablet Take 1,000 mcg by mouth daily.  . vitamin C (ASCORBIC ACID) 500 MG tablet Take 500 mg by mouth 2 (two) times daily.  . Zinc 50 MG TABS Take 1 tablet by mouth daily.   No current facility-administered medications on file prior to visit.    Allergies  Allergen Reactions  . Lisinopril Cough    PMHx:   Past Medical History:  Diagnosis Date  .  Arthritis    Reiters Polyarthritis  . Cholelithiases 02/04/2018   Per Abd [place] 01/2018 with fatty liver, no inflammation or obstruction  . Elevated hemoglobin A1c measurement   . Gout   . Hyperlipidemia   . Hypertension    Immunization History  Administered Date(s) Administered  . PPD Test 06/23/2013, 10/05/2014, 10/20/2015, 12/27/2016, 01/23/2018  . Tdap 10/05/2014   History reviewed. No pertinent surgical history.  FHx:    Reviewed / unchanged  SHx:    Reviewed / unchanged   Systems Review:  Constitutional: Denies fever, chills, wt changes, headaches, insomnia, fatigue, night sweats, change in appetite. Eyes: Denies redness, blurred vision, diplopia, discharge, itchy, watery eyes.  ENT: Denies discharge,  congestion, post nasal drip, epistaxis, sore throat, earache, hearing loss, dental pain, tinnitus, vertigo, sinus pain, snoring.  CV: Denies chest pain, palpitations, irregular heartbeat, syncope, dyspnea, diaphoresis, orthopnea, PND, claudication or edema. Respiratory: denies cough, dyspnea, DOE, pleurisy, hoarseness, laryngitis, wheezing.  Gastrointestinal: Denies dysphagia, odynophagia, heartburn, reflux, water brash, abdominal pain or cramps, nausea, vomiting, bloating, diarrhea, constipation, hematemesis, melena, hematochezia  or hemorrhoids. Genitourinary: Denies dysuria, frequency, urgency, nocturia, hesitancy, discharge, hematuria or flank pain. Musculoskeletal: Denies arthralgias, myalgias, stiffness, jt. swelling, pain, limping or strain/sprain.  Skin: Denies pruritus, rash, hives, warts, acne, eczema or change in skin lesion(s). Neuro: No weakness, tremor, incoordination, spasms, paresthesia or pain. Psychiatric: Denies confusion, memory loss or sensory loss. Endo: Denies change in weight, skin or hair change.  Heme/Lymph: No excessive bleeding, bruising or enlarged lymph nodes.  Physical Exam  BP 120/76   Pulse 68   Temp (!) 97 F (36.1 C)   Resp 16   Ht 5\' 11"  (1.803 m)   Wt 260 lb (117.9 kg)   BMI 36.26 kg/m   Appears  well nourished, well groomed  and in no distress.  Eyes: PERRLA, EOMs, conjunctiva no swelling or erythema. Sinuses: No frontal/maxillary tenderness ENT/Mouth: EAC's clear, TM's nl w/o erythema, bulging. Nares clear w/o erythema, swelling, exudates. Oropharynx clear without erythema or exudates. Oral hygiene is good. Tongue normal, non obstructing. Hearing intact.  Neck: Supple. Thyroid not palpable. Car 2+/2+ without bruits, nodes or JVD. Chest: Respirations nl with BS clear & equal w/o rales, rhonchi, wheezing or stridor.  Cor: Heart sounds normal w/ regular rate and rhythm without sig. murmurs, gallops, clicks or rubs. Peripheral pulses normal and equal   without edema.  Abdomen: Soft & bowel sounds normal. Non-tender w/o guarding, rebound, hernias, masses or organomegaly.  Lymphatics: Unremarkable.  Musculoskeletal: Full ROM all peripheral extremities, joint stability, 5/5 strength and normal gait.  Skin: Warm, dry without exposed rashes, lesions or ecchymosis apparent.  Neuro: Cranial nerves intact, reflexes equal bilaterally. Sensory-motor testing grossly intact. Tendon reflexes grossly intact.  Pysch: Alert & oriented x 3.  Insight and judgement nl & appropriate. No ideations.  Assessment and Plan:  1. Essential hypertension  - D/C Lisinopril & Replace w/Losartan 50 mg daily. - monitor blood pressure at home.  - Continue DASH diet.  Reminder to go to the ER if any CP,  SOB, nausea, dizziness, severe HA, changes vision/speech.  - CBC with Differential/Platelet - COMPLETE METABOLIC PANEL WITH GFR - Magnesium - TSH  - losartan (COZAAR) 50 MG tablet; Take 1 tablet at Night for BP  Dispense: 90 tablet; Refill: 3  2. Hyperlipidemia, mixed  - Continue diet/meds, exercise,& lifestyle modifications.  - Continue monitor periodic cholesterol/liver & renal functions   - Lipid panel - TSH  3. Abnormal glucose  -  Continue diet, exercise  - Lifestyle modifications.  - Monitor appropriate labs.  - Hemoglobin A1c - Insulin, random  4. Vitamin D deficiency  - Continue supplementation.  - VITAMIN D 25 Hydroxyl  5. PreDiabetes  - Hemoglobin A1c - Insulin, random  6. Idiopathic gout  - Uric acid  7. Testosterone deficiency  - Testosterone  8. Medication management  - CBC with Differential/Platelet - COMPLETE METABOLIC PANEL WITH GFR - Magnesium - Lipid panel - TSH - Hemoglobin A1c - Insulin, random - VITAMIN D 25 Hydroxyl - Uric acid  9. Morbid obesity (BMI 37.49)   - topiramate (TOPAMAX) 50 MG tablet; Take 1 tablet 2 x / day at Suppertime & Bedtime  Dispense: 180 tablet; Refill: 3  10. Non-seasonal allergic  rhinitis due to pollen  - montelukast (SINGULAIR) 10 MG tablet; Take 1 tablet daily for Allergies & Asthma  Dispense: 90 tablet; Refill: 3      Discussed  regular exercise, BP monitoring, weight control to achieve/maintain BMI less than 25 and discussed med and SE's. Recommended labs to assess and monitor clinical status with further disposition pending results of labs.  I discussed the assessment and treatment plan with the patient. The patient was provided an opportunity to ask questions and all were answered. The patient agreed with the plan and demonstrated an understanding of the instructions.  I provided over 30 minutes of exam, counseling, chart review and  complex critical decision making.  Kirtland Bouchard, MD\

## 2018-08-04 NOTE — Patient Instructions (Signed)

## 2018-08-05 ENCOUNTER — Other Ambulatory Visit: Payer: Self-pay | Admitting: Internal Medicine

## 2018-08-05 ENCOUNTER — Ambulatory Visit (INDEPENDENT_AMBULATORY_CARE_PROVIDER_SITE_OTHER): Payer: BC Managed Care – PPO | Admitting: Internal Medicine

## 2018-08-05 ENCOUNTER — Other Ambulatory Visit: Payer: Self-pay

## 2018-08-05 VITALS — BP 120/76 | HR 68 | Temp 97.0°F | Resp 16 | Ht 71.0 in | Wt 260.0 lb

## 2018-08-05 DIAGNOSIS — Z79899 Other long term (current) drug therapy: Secondary | ICD-10-CM

## 2018-08-05 DIAGNOSIS — M1000 Idiopathic gout, unspecified site: Secondary | ICD-10-CM | POA: Diagnosis not present

## 2018-08-05 DIAGNOSIS — R7309 Other abnormal glucose: Secondary | ICD-10-CM

## 2018-08-05 DIAGNOSIS — E782 Mixed hyperlipidemia: Secondary | ICD-10-CM | POA: Diagnosis not present

## 2018-08-05 DIAGNOSIS — E559 Vitamin D deficiency, unspecified: Secondary | ICD-10-CM

## 2018-08-05 DIAGNOSIS — E349 Endocrine disorder, unspecified: Secondary | ICD-10-CM | POA: Diagnosis not present

## 2018-08-05 DIAGNOSIS — I10 Essential (primary) hypertension: Secondary | ICD-10-CM | POA: Diagnosis not present

## 2018-08-05 DIAGNOSIS — J301 Allergic rhinitis due to pollen: Secondary | ICD-10-CM

## 2018-08-05 MED ORDER — LOSARTAN POTASSIUM 50 MG PO TABS: ORAL_TABLET | ORAL | 3 refills | Status: DC

## 2018-08-05 MED ORDER — TOPIRAMATE 50 MG PO TABS: ORAL_TABLET | ORAL | 3 refills | Status: DC

## 2018-08-05 MED ORDER — MONTELUKAST SODIUM 10 MG PO TABS: ORAL_TABLET | ORAL | 3 refills | Status: DC

## 2018-08-06 LAB — COMPLETE METABOLIC PANEL WITH GFR
AG Ratio: 1.5 (calc) (ref 1.0–2.5)
ALT: 70 U/L — ABNORMAL HIGH (ref 9–46)
AST: 39 U/L — ABNORMAL HIGH (ref 10–35)
Albumin: 4.4 g/dL (ref 3.6–5.1)
Alkaline phosphatase (APISO): 67 U/L (ref 35–144)
BUN: 17 mg/dL (ref 7–25)
CO2: 31 mmol/L (ref 20–32)
Calcium: 9.8 mg/dL (ref 8.6–10.3)
Chloride: 100 mmol/L (ref 98–110)
Creat: 1.05 mg/dL (ref 0.70–1.33)
GFR, Est African American: 91 mL/min/{1.73_m2} (ref >=60)
GFR, Est Non African American: 78 mL/min/{1.73_m2} (ref >=60)
Globulin: 2.9 g/dL (calc) (ref 1.9–3.7)
Glucose, Bld: 79 mg/dL (ref 65–99)
Potassium: 4.9 mmol/L (ref 3.5–5.3)
Sodium: 136 mmol/L (ref 135–146)
Total Bilirubin: 0.7 mg/dL (ref 0.2–1.2)
Total Protein: 7.3 g/dL (ref 6.1–8.1)

## 2018-08-06 LAB — CBC WITH DIFFERENTIAL/PLATELET
Absolute Monocytes: 1447 cells/uL — ABNORMAL HIGH (ref 200–950)
Basophils Absolute: 108 cells/uL (ref 0–200)
Basophils Relative: 1 %
Eosinophils Absolute: 734 cells/uL — ABNORMAL HIGH (ref 15–500)
Eosinophils Relative: 6.8 %
HCT: 52.5 % — ABNORMAL HIGH (ref 38.5–50.0)
Hemoglobin: 18 g/dL — ABNORMAL HIGH (ref 13.2–17.1)
Lymphs Abs: 2840 cells/uL (ref 850–3900)
MCH: 31.9 pg (ref 27.0–33.0)
MCHC: 34.3 g/dL (ref 32.0–36.0)
MCV: 92.9 fL (ref 80.0–100.0)
MPV: 11 fL (ref 7.5–12.5)
Monocytes Relative: 13.4 %
Neutro Abs: 5670 cells/uL (ref 1500–7800)
Neutrophils Relative %: 52.5 %
Platelets: 259 10*3/uL (ref 140–400)
RBC: 5.65 10*6/uL (ref 4.20–5.80)
RDW: 13.3 % (ref 11.0–15.0)
Total Lymphocyte: 26.3 %
WBC: 10.8 10*3/uL (ref 3.8–10.8)

## 2018-08-06 LAB — LIPID PANEL
Cholesterol: 153 mg/dL (ref <200)
HDL: 38 mg/dL — ABNORMAL LOW (ref >=40)
LDL Cholesterol (Calc): 83 mg/dL (calc)
Non-HDL Cholesterol (Calc): 115 mg/dL (calc) (ref <130)
Total CHOL/HDL Ratio: 4 (calc) (ref <5.0)
Triglycerides: 230 mg/dL — ABNORMAL HIGH (ref <150)

## 2018-08-06 LAB — TSH: TSH: 2.08 mIU/L (ref 0.40–4.50)

## 2018-08-06 LAB — TESTOSTERONE,TOTAL,MALES,(ADULT),IMMUNOASSAY(REFL): Testosterone: 2009 ng/dL — ABNORMAL HIGH (ref 250–827)

## 2018-08-06 LAB — HEMOGLOBIN A1C
Hgb A1c MFr Bld: 5.3 % of total Hgb (ref <5.7)
Mean Plasma Glucose: 105 (calc)
eAG (mmol/L): 5.8 (calc)

## 2018-08-06 LAB — INSULIN, RANDOM: Insulin: 15.2 u[IU]/mL

## 2018-08-06 LAB — MAGNESIUM: Magnesium: 2.1 mg/dL (ref 1.5–2.5)

## 2018-08-06 LAB — VITAMIN D 25 HYDROXY (VIT D DEFICIENCY, FRACTURES): Vit D, 25-Hydroxy: 72 ng/mL (ref 30–100)

## 2018-08-06 LAB — URIC ACID: Uric Acid, Serum: 4.6 mg/dL (ref 4.0–8.0)

## 2018-09-24 ENCOUNTER — Other Ambulatory Visit: Payer: Self-pay | Admitting: Internal Medicine

## 2018-09-24 DIAGNOSIS — E349 Endocrine disorder, unspecified: Secondary | ICD-10-CM

## 2018-09-24 MED ORDER — TESTOSTERONE CYPIONATE 200 MG/ML IM SOLN: INTRAMUSCULAR | 2 refills | Status: DC

## 2018-09-24 MED ORDER — "BD LUER-LOK SYRINGE 21G X 1"" 3 ML MISC": 1 refills | Status: DC

## 2018-10-31 NOTE — Progress Notes (Signed)
FOLLOW UP  Assessment and Plan:   Hypertension Continue medications   Monitor blood pressure at home; patient to call if consistently greater than 130/80 Continue DASH diet.   Reminder to go to the ER if any CP, SOB, nausea, dizziness, severe HA, changes vision/speech, left arm numbness and tingling and jaw pain.  Cholesterol Currently at LDL goal; diet for trigs emphasized Continue low cholesterol diet and exercise.  Check lipid panel.   Other abnormal glucose Recent A1Cs at goal Discussed diet/exercise, weight management  Defer A1C; check CMP  Obesity with co morbidities Long discussion about weight loss, diet, and exercise Recommended diet heavy in fruits and veggies and low in animal meats, cheeses, and dairy products, appropriate calorie intake Discussed ideal weight for height Patient on phentermine, advised to take a break, try lower dose of topamax, taking drug breaks; continue close follow up. Return in 3 months Reviewed diet in length; recommend he focus on reducing processed carbohydrate intake, Stop soda, biscuits Will follow up in 3 months  Vitamin D Def At goal at last visit; continue supplementation to maintain goal of 70-100 Defer Vit D level  Hypogonadism-  continue to monitor, states medication is helping with symptoms of low T. Check testosterone levels   Gout Continue allopurinol Diet discussed Check uric acid as needed  Elevated LFTs/fatty liver Hepatitis panel has been checked and negative Fatty liver on [place] Weigth loss encouraged; Low glycemic index diet and fatty liver discussed  Rheumatoid arthritis Followed by Dr. Dossie Der; well controlled on enbrel  Checking CBC, CMP, CRP, ESR per request to forward   Continue diet and meds as discussed. Further disposition pending results of labs. Discussed med's effects and SE's.   Over 30 minutes of exam, counseling, chart review, and critical decision making was performed.   Future Appointments  Date  Time Provider [place]  02/10/2019  9:00 AM Unk Pinto, MD [facility] None    ----------------------------------------------------------------------------------------------------------------------  HPI 58 y.o. male  presents for 3 month follow up on hypertension, cholesterol, glucose management, obesity, hypogonadism and vitamin D deficiency.   He is followed by Dr. Dossie Der for RA since 2004 and is on enbrel with excellent control.   he is prescribed phentermine for weight loss. He did try topamax but felt his heart was racing with this (when taking 100 mg) and stopped taking. While on the medication they have lost 0 lbs since last visit. They deny palpitations, anxiety, trouble sleeping, elevated BP.   BMI is Body mass index is 37.24 kg/m., he is working on diet and exercise. Tries to get out and work in yard an hour most days.  Wt Readings from Last 3 Encounters:  11/05/18 267 lb (121.1 kg)  08/05/18 260 lb (117.9 kg)  01/23/18 262 lb (118.8 kg)   Typical breakfast: Jimmy dean sausage biscuit with cup of coffee, may also add a piece of fruit, or may have bowl of raisin bran Snack: fruit, crackers  Typical lunch: Sandwich, [place], swiss cheese  Typical dinner: Whatever - catch all  Exercise:  Water intake: water, some unsweet tea, green tea, coffee, soda may have 1 can a day  He does check BP occasionally at home, today their BP is BP: 124/88   He does not workout. He denies chest pain, shortness of breath, dizziness.    He is on cholesterol medication (rosuvastatin 20 mg on MWF) and denies myalgias. His LDL cholesterol is at goal; his trigs remain mildly elevated. The cholesterol last visit was:  Lab  Results  Component Value Date   CHOL 153 08/05/2018   HDL 38 (L) 08/05/2018   LDLCALC 83 08/05/2018   TRIG 230 (H) 08/05/2018   CHOLHDL 4.0 08/05/2018    He has not been working on diet and exercise for glucose management, and denies foot ulcerations, increased  appetite, nausea, paresthesia of the feet, polydipsia, polyuria, visual disturbances, vomiting and weight loss. Last A1C in the office was:  Lab Results  Component Value Date   HGBA1C 5.3 08/05/2018   Patient is on Vitamin D supplement and at goal at last check:    Lab Results  Component Value Date   VD25OH 72 08/05/2018     Patient is on allopurinol for gout and does not report a recent flare.  Lab Results  Component Value Date   LABURIC 4.6 08/05/2018   He has a history of testosterone deficiency and is on testosterone replacement, taking 400 mg every 2 weeks; last injection was 2 weeks ago. He states that the testosterone helps with his energy, libido, muscle mass. Lab Results  Component Value Date   TESTOSTERONE 2,009 (H) 08/05/2018   He has known fatty liver, LFTs monitored routinely through this office:  Lab Results  Component Value Date   ALT 70 (H) 08/05/2018   AST 39 (H) 08/05/2018   ALKPHOS 69 05/22/2016   BILITOT 0.7 08/05/2018     Current Medications:  Current Outpatient Medications on File Prior to Visit  Medication Sig  . allopurinol (ZYLOPRIM) 300 MG tablet Take 300 mg by mouth daily.   Marland Kitchen aspirin EC 81 MG tablet Take 81 mg by mouth daily.  . bisoprolol-hydrochlorothiazide (ZIAC) 5-6.25 MG tablet TAKE 1 TABLET BY MOUTH EVERY MORNING FOR BLOOD PRESSURE  . Cholecalciferol (VITAMIN D PO) Take 5,000 Units by mouth 2 (two) times daily.  Scarlette Shorts SURECLICK 50 MG/ML injection   . finasteride (PROSCAR) 5 MG tablet TAKE 1 TABLET DAILY FOR    PROSTATE  . fluocinonide ointment (LIDEX) 0.05 %   . losartan (COZAAR) 50 MG tablet Take 1 tablet at Night for BP  . Magnesium 500 MG TABS Take 1 tablet by mouth daily.  . montelukast (SINGULAIR) 10 MG tablet Take 1 tablet daily for Allergies & Asthma  . Multiple Vitamin (MULTIVITAMIN) tablet Take 1 tablet by mouth daily.  . Omega-3 Fatty Acids (FISH OIL) 1200 MG CAPS Take by mouth 2 (two) times daily.  . phentermine (ADIPEX-P)  37.5 MG tablet Take 1/2 to  tablet every morning for Dieting & Weight Loss  . rosuvastatin (CRESTOR) 40 MG tablet Take 1/2 to 1 tablet daily or as directed for Cholesterol  . SYRINGE-NEEDLE, DISP, 3 ML (B-D 3CC LUER-LOK SYR 21GX1") 21G X 1" 3 ML MISC USE AS DIRECTED WITH TESTOSTERONE INJECTION EVERY 14 DAYS  . testosterone cypionate (DEPOTESTOSTERONE CYPIONATE) 200 MG/ML injection ADMINISTER 2 ML IN THE MUSCLE EVERY 2 WEEKS  . topiramate (TOPAMAX) 50 MG tablet Take 1 tablet 2 x / day at Suppertime & Bedtime  . vitamin B-12 (CYANOCOBALAMIN) 1000 MCG tablet Take 1,000 mcg by mouth daily.  . vitamin C (ASCORBIC ACID) 500 MG tablet Take 500 mg by mouth 2 (two) times daily.  . Zinc 50 MG TABS Take 1 tablet by mouth daily.   No current facility-administered medications on file prior to visit.      Allergies:  Allergies  Allergen Reactions  . Lisinopril Cough     Medical History:  Past Medical History:  Diagnosis Date  . Arthritis  Reiters Polyarthritis  . Cholelithiases 02/04/2018   Per Abd [place] 01/2018 with fatty liver, no inflammation or obstruction  . Elevated hemoglobin A1c measurement   . Gout   . Hyperlipidemia   . Hypertension    Family history- Reviewed and unchanged Social history- Reviewed and unchanged   Review of Systems:  Review of Systems  Constitutional: Negative for malaise/fatigue and weight loss.  HENT: Negative for hearing loss and tinnitus.   Eyes: Negative for blurred vision and double vision.  Respiratory: Negative for cough, shortness of breath and wheezing.   Cardiovascular: Negative for chest pain, palpitations, orthopnea, claudication and leg swelling.  Gastrointestinal: Negative for abdominal pain, blood in stool, constipation, diarrhea, heartburn, melena, nausea and vomiting.  Genitourinary: Negative.   Musculoskeletal: Negative for joint pain and myalgias.  Skin: Negative for rash.  Neurological: Negative for dizziness, tingling, sensory change,  weakness and headaches.  Endo/Heme/Allergies: Negative for polydipsia.  Psychiatric/Behavioral: Negative.   All other systems reviewed and are negative.   Physical Exam: BP 124/88   Pulse 99   Temp 97.9 F (36.6 C)   Ht '5\' 11"'  (1.803 m)   Wt 267 lb (121.1 kg)   SpO2 96%   BMI 37.24 kg/m  Wt Readings from Last 3 Encounters:  11/05/18 267 lb (121.1 kg)  08/05/18 260 lb (117.9 kg)  01/23/18 262 lb (118.8 kg)   General Appearance: Well nourished, obese male in no apparent distress. Eyes: PERRLA, EOMs, conjunctiva no swelling or erythema Sinuses: No Frontal/maxillary tenderness ENT/Mouth: Ext aud canals clear, TMs without erythema, bulging. No erythema, swelling, or exudate on post pharynx.  Tonsils not swollen or erythematous. Hearing normal.  Neck: Supple, thyroid normal.  Respiratory: Respiratory effort normal, BS equal bilaterally without rales, rhonchi, wheezing or stridor.  Cardio: RRR with no MRGs. Brisk peripheral pulses without edema.  Abdomen: Soft, obese abodmen + BS.  Non tender, no guarding, rebound, hernias, masses. Lymphatics: Non tender without lymphadenopathy.  Musculoskeletal: Full ROM, 5/5 strength, Normal gait Skin: Warm, dry without rashes, lesions, ecchymosis.  Neuro: Cranial nerves intact. No cerebellar symptoms.  Psych: Awake and oriented X 3, normal affect, Insight and Judgment appropriate.    Izora Ribas, NP 9:01 AM [facility]

## 2018-11-05 ENCOUNTER — Encounter: Payer: Self-pay | Admitting: Adult Health

## 2018-11-05 ENCOUNTER — Ambulatory Visit (INDEPENDENT_AMBULATORY_CARE_PROVIDER_SITE_OTHER): Payer: BC Managed Care – PPO | Admitting: Adult Health

## 2018-11-05 ENCOUNTER — Other Ambulatory Visit: Payer: Self-pay

## 2018-11-05 VITALS — BP 124/88 | HR 99 | Temp 97.9°F | Ht 71.0 in | Wt 267.0 lb

## 2018-11-05 DIAGNOSIS — M1000 Idiopathic gout, unspecified site: Secondary | ICD-10-CM

## 2018-11-05 DIAGNOSIS — E669 Obesity, unspecified: Secondary | ICD-10-CM

## 2018-11-05 DIAGNOSIS — R7309 Other abnormal glucose: Secondary | ICD-10-CM | POA: Diagnosis not present

## 2018-11-05 DIAGNOSIS — K760 Fatty (change of) liver, not elsewhere classified: Secondary | ICD-10-CM

## 2018-11-05 DIAGNOSIS — I10 Essential (primary) hypertension: Secondary | ICD-10-CM

## 2018-11-05 DIAGNOSIS — E782 Mixed hyperlipidemia: Secondary | ICD-10-CM

## 2018-11-05 DIAGNOSIS — E66811 Obesity, class 1: Secondary | ICD-10-CM

## 2018-11-05 DIAGNOSIS — M069 Rheumatoid arthritis, unspecified: Secondary | ICD-10-CM

## 2018-11-05 DIAGNOSIS — E291 Testicular hypofunction: Secondary | ICD-10-CM

## 2018-11-05 DIAGNOSIS — E559 Vitamin D deficiency, unspecified: Secondary | ICD-10-CM

## 2018-11-05 DIAGNOSIS — Z79899 Other long term (current) drug therapy: Secondary | ICD-10-CM

## 2018-11-05 NOTE — Patient Instructions (Signed)
Goals    . Exercise 150 min/wk Moderate Activity    . Weight (lb) < 245 lb (111.1 kg)       Recommend you focus on reducing processed carbohydrates - focus on lean protein, high fiber/low sugar/starch fruits and vegetables with healthy fats     Drink 1/2 your body weight in fluid ounces of water daily; drink a tall glass of water 30 min before meals  Don't eat until you're stuffed- listen to your stomach and eat until you are 80% full   Try eating off of a salad plate; wait 10 min after finishing before going back for seconds  Start by eating the vegetables on your plate; aim for 50% of your meals to be fruits or vegetables  Then eat your protein - lean meats (grass fed if possible), fish, beans, nuts in moderation  Eat your carbs/starch last ONLY if you still are hungry. If you can, stop before finishing it all  Avoid sugar and flour - the closer it looks to it's original form in nature, typically the better it is for you  Splurge in moderation - "assign" days when you get to splurge and have the "bad stuff" - I like to follow a 80% - 20% plan- "good" choices 80 % of the time, "bad" choices in moderation 20% of the time  Simple equation is: Calories out > calories in = weight loss - even if you eat the bad stuff, if you limit portions, you will still lose weight     Know what a healthy weight is for you (roughly BMI <25) and aim to maintain this  Aim for 7+ servings of fruits and vegetables daily  65-80+ fluid ounces of water or unsweet tea for healthy kidneys  Limit to max 1 drink of alcohol per day; avoid smoking/tobacco  Limit animal fats in diet for cholesterol and heart health - choose grass fed whenever available  Avoid highly processed foods, and foods high in saturated/trans fats  Aim for low stress - take time to unwind and care for your mental health  Aim for 150 min of moderate intensity exercise weekly for heart health, and weights twice weekly for bone  health  Aim for 7-9 hours of sleep daily       Bad carbs also include fruit juice, alcohol, and sweet tea. These are empty calories that do not signal to your brain that you are full.   Please remember the good carbs are still carbs which convert into sugar. So please measure them out no more than 1/2-1 cup of rice, oatmeal, pasta, and beans  Veggies are however free foods! Pile them on.   Not all fruit is created equal. Please see the list below, the fruit at the bottom is higher in sugars than the fruit at the top. Please avoid all dried fruits.

## 2018-11-06 ENCOUNTER — Other Ambulatory Visit: Payer: Self-pay | Admitting: Adult Health

## 2018-11-06 DIAGNOSIS — E349 Endocrine disorder, unspecified: Secondary | ICD-10-CM

## 2018-11-06 LAB — COMPLETE METABOLIC PANEL WITH GFR
AG Ratio: 1.6 (calc) (ref 1.0–2.5)
ALT: 73 U/L — ABNORMAL HIGH (ref 9–46)
AST: 44 U/L — ABNORMAL HIGH (ref 10–35)
Albumin: 4.4 g/dL (ref 3.6–5.1)
Alkaline phosphatase (APISO): 78 U/L (ref 35–144)
BUN: 16 mg/dL (ref 7–25)
CO2: 26 mmol/L (ref 20–32)
Calcium: 9.2 mg/dL (ref 8.6–10.3)
Chloride: 102 mmol/L (ref 98–110)
Creat: 1.17 mg/dL (ref 0.70–1.33)
GFR, Est African American: 80 mL/min/{1.73_m2} (ref >=60)
GFR, Est Non African American: 69 mL/min/{1.73_m2} (ref >=60)
Globulin: 2.8 g/dL (calc) (ref 1.9–3.7)
Glucose, Bld: 84 mg/dL (ref 65–99)
Potassium: 4.5 mmol/L (ref 3.5–5.3)
Sodium: 139 mmol/L (ref 135–146)
Total Bilirubin: 0.7 mg/dL (ref 0.2–1.2)
Total Protein: 7.2 g/dL (ref 6.1–8.1)

## 2018-11-06 LAB — CBC WITH DIFFERENTIAL/PLATELET
Absolute Monocytes: 1253 cells/uL — ABNORMAL HIGH (ref 200–950)
Basophils Absolute: 80 cells/uL (ref 0–200)
Basophils Relative: 1.2 %
Eosinophils Absolute: 395 cells/uL (ref 15–500)
Eosinophils Relative: 5.9 %
HCT: 49.7 % (ref 38.5–50.0)
Hemoglobin: 16.9 g/dL (ref 13.2–17.1)
Lymphs Abs: 1742 cells/uL (ref 850–3900)
MCH: 31.6 pg (ref 27.0–33.0)
MCHC: 34 g/dL (ref 32.0–36.0)
MCV: 93.1 fL (ref 80.0–100.0)
MPV: 10.8 fL (ref 7.5–12.5)
Monocytes Relative: 18.7 %
Neutro Abs: 3229 cells/uL (ref 1500–7800)
Neutrophils Relative %: 48.2 %
Platelets: 249 10*3/uL (ref 140–400)
RBC: 5.34 10*6/uL (ref 4.20–5.80)
RDW: 13.9 % (ref 11.0–15.0)
Total Lymphocyte: 26 %
WBC: 6.7 10*3/uL (ref 3.8–10.8)

## 2018-11-06 LAB — LIPID PANEL
Cholesterol: 142 mg/dL (ref <200)
HDL: 41 mg/dL (ref >=40)
LDL Cholesterol (Calc): 74 mg/dL (calc)
Non-HDL Cholesterol (Calc): 101 mg/dL (calc) (ref <130)
Total CHOL/HDL Ratio: 3.5 (calc) (ref <5.0)
Triglycerides: 173 mg/dL — ABNORMAL HIGH (ref <150)

## 2018-11-06 LAB — SEDIMENTATION RATE: Sed Rate: 2 mm/h (ref 0–20)

## 2018-11-06 LAB — MAGNESIUM: Magnesium: 2 mg/dL (ref 1.5–2.5)

## 2018-11-06 LAB — C-REACTIVE PROTEIN: CRP: 3.1 mg/L (ref <8.0)

## 2018-11-06 LAB — TESTOSTERONE: Testosterone: 662 ng/dL (ref 250–827)

## 2018-11-06 LAB — URIC ACID: Uric Acid, Serum: 6 mg/dL (ref 4.0–8.0)

## 2018-11-06 LAB — TSH: TSH: 1.84 mIU/L (ref 0.40–4.50)

## 2018-11-06 MED ORDER — TESTOSTERONE CYPIONATE 200 MG/ML IM SOLN: INTRAMUSCULAR | 2 refills | Status: DC

## 2019-01-24 ENCOUNTER — Other Ambulatory Visit: Payer: Self-pay | Admitting: Internal Medicine

## 2019-02-02 ENCOUNTER — Other Ambulatory Visit: Payer: Self-pay | Admitting: Internal Medicine

## 2019-02-02 DIAGNOSIS — N401 Enlarged prostate with lower urinary tract symptoms: Secondary | ICD-10-CM

## 2019-02-10 ENCOUNTER — Encounter: Payer: Self-pay | Admitting: Internal Medicine

## 2019-03-20 NOTE — Progress Notes (Signed)
Complete Physical  Assessment and Plan:  Encounter for general adult medical examination with abnormal findings 1 year  Essential hypertension - continue medications, DASH diet, exercise and monitor at home. Call if greater than 130/80.  -     CBC with Differential/Platelet -     COMPLETE METABOLIC PANEL WITH GFR -     TSH -     Microalbumin / creatinine urine ratio -     Urinalysis, Routine w reflex microscopic -     EKG 12-Lead  Hyperlipidemia, mixed -     Lipid panel check lipids decrease fatty foods increase activity.   Abnormal glucose -     Hemoglobin A1c Discussed disease progression and risks Discussed diet/exercise, weight management and risk modification  Medication management -     Magnesium  Vitamin D deficiency -     VITAMIN D 25 Hydroxy (Vit-D Deficiency, Fractures)  Rheumatoid arthritis involving multiple sites, unspecified whether rheumatoid factor present ([place]) Continue follow up  Hypogonadism in male -     Testosterone  Idiopathic gout, unspecified chronicity, unspecified site Gout- recheck Uric acid as needed, Diet discussed, continue medications.  Fatty liver Check labs, avoid tylenol, alcohol, weight loss advised.   Obesity (BMI 30.0-34.9) - follow up 3 months for progress monitoring - increase veggies, decrease carbs - long discussion about weight loss, diet, and exercise  Arthritis Monitor  Screening PSA (prostate specific antigen) -     PSA  Discussed med's effects and SE's. Screening labs and tests as requested with regular follow-up as recommended. Over 40 minutes of exam, counseling, chart review and critical decision making was performed Future Appointments  Date Time Provider [place]  03/24/2020  9:00 AM Vicie Mutters, PA-C [facility] None    HPI Patient presents for a complete physical.   He is followed by Dr. Dossie Der for RA since 2004 and is on enbrel with excellent control.   BMI is Body mass index is 36.65  kg/m., he is working on diet and exercise. He is on phentermine and topamax for weight loss, has lost 5 lbs.  Wt Readings from Last 3 Encounters:  03/21/19 262 lb 12.8 oz (119.2 kg)  11/05/18 267 lb (121.1 kg)  08/05/18 260 lb (117.9 kg)   His blood pressure is checked by his wife at home, states it is normal there, he did have coffee and phentermine before coming here today, today their BP is BP: 130/80(resting 5 mins).  He does not workout, takes his walk on a couple of short walks during the day, working from home the past year. He denies chest pain, shortness of breath, dizziness.  He is on cholesterol medication, crestor 40 mg 1/2 M,W,F and denies myalgias. His cholesterol is at goal. The cholesterol last visit was:   Lab Results  Component Value Date   CHOL 142 03/21/2019   HDL 43 03/21/2019   LDLCALC 73 03/21/2019   TRIG 180 (H) 03/21/2019   CHOLHDL 3.3 03/21/2019   He has been working on diet and exercise for prediabetes,  Last A1C in the office was:  Lab Results  Component Value Date   HGBA1C 5.5 03/21/2019   Last GFR: Lab Results  Component Value Date   GFRNONAA 60 03/21/2019   He has a history of testosterone deficiency and is on testosterone replacement. He states that the testosterone helps with his energy, libido, muscle mass. Denies symptoms of elevated estrogen or dihydrotestosterone.  Lab Results  Component Value Date   TESTOSTERONE 484 03/21/2019  We are monitoring his LFTs for fatty liver and he did have a gallstone but no evidence of inflammation- 01/2018 Lab Results  Component Value Date   ALT 67 (H) 03/21/2019   AST 34 03/21/2019   ALKPHOS 69 05/22/2016   BILITOT 0.7 03/21/2019    Patient is on Vitamin D supplement.   Lab Results  Component Value Date   VD25OH 76 03/21/2019     Last PSA was: Lab Results  Component Value Date   PSA 0.2 03/21/2019   Patient is on allopurinol for gout and does report a recent flare.  Lab Results  Component  Value Date   LABURIC 6.0 11/05/2018    Current Medications:  Current Outpatient Medications on File Prior to Visit  Medication Sig Dispense Refill  . allopurinol (ZYLOPRIM) 300 MG tablet Take 300 mg by mouth daily.     Marland Kitchen aspirin EC 81 MG tablet Take 81 mg by mouth daily.    . Cholecalciferol (VITAMIN D PO) Take 5,000 Units by mouth 2 (two) times daily.    Scarlette Shorts SURECLICK 50 MG/ML injection     . finasteride (PROSCAR) 5 MG tablet Take 1 tablet Daily for Prostate 90 tablet 3  . fluocinonide ointment (LIDEX) 0.05 %     . losartan (COZAAR) 50 MG tablet Take 1 tablet at Night for BP 90 tablet 3  . Magnesium 500 MG TABS Take 1 tablet by mouth daily.    . montelukast (SINGULAIR) 10 MG tablet Take 1 tablet daily for Allergies & Asthma 90 tablet 3  . Multiple Vitamin (MULTIVITAMIN) tablet Take 1 tablet by mouth daily.    . Omega-3 Fatty Acids (FISH OIL) 1200 MG CAPS Take by mouth 2 (two) times daily.    . phentermine (ADIPEX-P) 37.5 MG tablet Take 1/2 to 1 tablet Daily for Dieting & Weight Loss 90 tablet 1  . rosuvastatin (CRESTOR) 40 MG tablet Take 1/2 to 1 tablet daily or as directed for Cholesterol 90 tablet 1  . SYRINGE-NEEDLE, DISP, 3 ML (B-D 3CC LUER-LOK SYR 21GX1") 21G X 1" 3 ML MISC USE AS DIRECTED WITH TESTOSTERONE INJECTION EVERY 14 DAYS 25 each 1  . testosterone cypionate (DEPOTESTOSTERONE CYPIONATE) 200 MG/ML injection ADMINISTER 1 ML IN THE MUSCLE EVERY 2 WEEKS 10 mL 2  . topiramate (TOPAMAX) 50 MG tablet Take 1 tablet 2 x / day at Suppertime & Bedtime 180 tablet 3  . vitamin B-12 (CYANOCOBALAMIN) 1000 MCG tablet Take 1,000 mcg by mouth daily.    . vitamin C (ASCORBIC ACID) 500 MG tablet Take 500 mg by mouth 2 (two) times daily.    . Zinc 50 MG TABS Take 1 tablet by mouth daily.    . bisoprolol-hydrochlorothiazide (ZIAC) 5-6.25 MG tablet TAKE 1 TABLET BY MOUTH EVERY MORNING FOR BLOOD PRESSURE 90 tablet 1   No current facility-administered medications on file prior to visit.    Allergies:  Allergies  Allergen Reactions  . Lisinopril Cough   Health Maintenance:  Immunization History  Administered Date(s) Administered  . PPD Test 06/23/2013, 10/05/2014, 10/20/2015, 12/27/2016, 01/23/2018  . Tdap 10/05/2014   Tetanus: 2016 Pneumovax: Prevnar 13: Flu vaccine: Does not get Shingrix: discussed  DEXA: CXR 2004 [place] MRI 2005 [place] AB 01/2018 Colonoscopy: Never Cologuard 02/15/2017 EGD: Eye Exam: [facility]  Dentist:  Patient Care Team: Unk Pinto, MD as PCP - General (Internal Medicine) Valinda Party, MD as Consulting Physician (Rheumatology)  Medical History:  has Hypertension; Abnormal glucose; Hyperlipidemia, mixed; Arthritis; Idiopathic gout; Medication  management; Vitamin D deficiency; Obesity (BMI 30.0-34.9); Fatty liver; Hypogonadism in male; and Rheumatoid arthritis involving multiple sites [facility]) on their problem list. Surgical History:  He  has no past surgical history on file. Family History:  His family history includes Heart attack in his half-brother. Unknown- mom and dad both dead Social History:   reports that he has never smoked. He has never used smokeless tobacco. He reports current alcohol use. He reports that he does not use drugs. Review of Systems:  ROS  Physical Exam: Estimated body mass index is 36.65 kg/m as calculated from the following:   Height as of this encounter: 5\' 11"  (1.803 m).   Weight as of this encounter: 262 lb 12.8 oz (119.2 kg). BP 130/80 Comment: resting 5 mins  Pulse 92   Temp 97.6 F (36.4 C)   Resp 16   Ht 5\' 11"  (1.803 m)   Wt 262 lb 12.8 oz (119.2 kg)   BMI 36.65 kg/m  General Appearance: Well nourished, in no apparent distress.  Eyes: PERRLA, EOMs, conjunctiva no swelling or erythema, normal fundi and vessels.  Sinuses: No Frontal/maxillary tenderness  ENT/Mouth: Ext aud canals clear, normal light reflex with TMs without erythema, bulging. Good dentition. No erythema, swelling, or  exudate on post pharynx. Tonsils not swollen or erythematous. Hearing normal.  Neck: Supple, thyroid normal. No bruits  Respiratory: Respiratory effort normal, BS equal bilaterally without rales, rhonchi, wheezing or stridor.  Cardio: RRR without murmurs, rubs or gallops. Brisk peripheral pulses without edema.  Chest: symmetric, with normal excursions and percussion.  Abdomen: Soft, nontender, no guarding, rebound, hernias, masses, or organomegaly.  Lymphatics: Non tender without lymphadenopathy.  Genitourinary:  Musculoskeletal: Full ROM all peripheral extremities,5/5 strength, and normal gait.  Skin: Warm, dry without rashes, lesions, ecchymosis. Neuro: Cranial nerves intact, reflexes equal bilaterally. Normal muscle tone, no cerebellar symptoms. Sensation intact.  Psych: Awake and oriented X 3, normal affect, Insight and Judgment appropriate.   EKG: WNL no changes. AORTA SCAN: WNL  Vicie Mutters 8:20 AM [facility]

## 2019-03-21 ENCOUNTER — Encounter: Payer: BC Managed Care – PPO | Admitting: Internal Medicine

## 2019-03-21 ENCOUNTER — Other Ambulatory Visit: Payer: Self-pay

## 2019-03-21 ENCOUNTER — Encounter: Payer: Self-pay | Admitting: Physician Assistant

## 2019-03-21 ENCOUNTER — Ambulatory Visit (INDEPENDENT_AMBULATORY_CARE_PROVIDER_SITE_OTHER): Payer: BC Managed Care – PPO | Admitting: Physician Assistant

## 2019-03-21 VITALS — BP 130/80 | HR 92 | Temp 97.6°F | Resp 16 | Ht 71.0 in | Wt 262.8 lb

## 2019-03-21 DIAGNOSIS — Z79899 Other long term (current) drug therapy: Secondary | ICD-10-CM

## 2019-03-21 DIAGNOSIS — E559 Vitamin D deficiency, unspecified: Secondary | ICD-10-CM

## 2019-03-21 DIAGNOSIS — E782 Mixed hyperlipidemia: Secondary | ICD-10-CM

## 2019-03-21 DIAGNOSIS — R7309 Other abnormal glucose: Secondary | ICD-10-CM

## 2019-03-21 DIAGNOSIS — Z0000 Encounter for general adult medical examination without abnormal findings: Secondary | ICD-10-CM | POA: Diagnosis not present

## 2019-03-21 DIAGNOSIS — Z1329 Encounter for screening for other suspected endocrine disorder: Secondary | ICD-10-CM | POA: Diagnosis not present

## 2019-03-21 DIAGNOSIS — Z131 Encounter for screening for diabetes mellitus: Secondary | ICD-10-CM | POA: Diagnosis not present

## 2019-03-21 DIAGNOSIS — R350 Frequency of micturition: Secondary | ICD-10-CM

## 2019-03-21 DIAGNOSIS — I10 Essential (primary) hypertension: Secondary | ICD-10-CM | POA: Diagnosis not present

## 2019-03-21 DIAGNOSIS — Z0001 Encounter for general adult medical examination with abnormal findings: Secondary | ICD-10-CM

## 2019-03-21 DIAGNOSIS — Z125 Encounter for screening for malignant neoplasm of prostate: Secondary | ICD-10-CM | POA: Diagnosis not present

## 2019-03-21 DIAGNOSIS — E669 Obesity, unspecified: Secondary | ICD-10-CM

## 2019-03-21 DIAGNOSIS — M1990 Unspecified osteoarthritis, unspecified site: Secondary | ICD-10-CM

## 2019-03-21 DIAGNOSIS — Z13220 Encounter for screening for lipoid disorders: Secondary | ICD-10-CM

## 2019-03-21 DIAGNOSIS — Z136 Encounter for screening for cardiovascular disorders: Secondary | ICD-10-CM

## 2019-03-21 DIAGNOSIS — N401 Enlarged prostate with lower urinary tract symptoms: Secondary | ICD-10-CM | POA: Diagnosis not present

## 2019-03-21 DIAGNOSIS — M1000 Idiopathic gout, unspecified site: Secondary | ICD-10-CM

## 2019-03-21 DIAGNOSIS — M069 Rheumatoid arthritis, unspecified: Secondary | ICD-10-CM

## 2019-03-21 DIAGNOSIS — Z1389 Encounter for screening for other disorder: Secondary | ICD-10-CM | POA: Diagnosis not present

## 2019-03-21 DIAGNOSIS — K760 Fatty (change of) liver, not elsewhere classified: Secondary | ICD-10-CM

## 2019-03-21 DIAGNOSIS — E291 Testicular hypofunction: Secondary | ICD-10-CM

## 2019-03-21 NOTE — Patient Instructions (Addendum)
At this time we do not have a plan to get the COVID vaccine in our office. You can go to [email or web address] to find out if you are eligible.  If you are eligible you can go to [email or web address]  to find a local place that is giving the shot.    [email or web address] also has all of this information if the sites above are not working for you.   There are also several waiting list that you can sign up for here are some below:  [place] go to [email or web address] to sign up for notification when additional vaccine appointments are available.   You can also sign up at [facility] for a wait list.   [facility] and [facility] have a wait list on line and you can sign up for it there.     If you have further questions or concerns about the vaccine process, please visit [email or web address]  Individuals seeking information about the vaccines and state's phased distribution plan can learn more by going to - [email or web address]    HYPERTENSION INFORMATION  Monitor your blood pressure at home, please keep a record and bring that in with you to your next office visit.   Go to the ER if any CP, SOB, nausea, dizziness, severe HA, changes vision/speech  Testing/Procedures: HOW TO TAKE YOUR BLOOD PRESSURE:  Rest 5 minutes before taking your blood pressure.  Don't smoke or drink caffeinated beverages for at least 30 minutes before.  Take your blood pressure before (not after) you eat.  Sit comfortably with your back supported and both feet on the floor (don't cross your legs).  Elevate your arm to heart level on a table or a desk.  Use the proper sized cuff. It should fit smoothly and snugly around your bare upper arm. There should be enough room to slip a fingertip under the cuff. The bottom edge of the cuff should be 1 inch above the crease of the elbow.  Due to a recent study, SPRINT, we have changed our goal for the systolic or top blood pressure number.  Ideally we want your top number at 120.  In the [facility] Trial, 5000 people were randomized to a goal BP of 120 and 5000 people were randomized to a goal BP of less than 140. The patients with the goal BP at 120 had LESS DEMENTIA, LESS HEART ATTACKS, AND LESS STROKES, AS WELL AS OVERALL DECREASED MORTALITY OR DEATH RATE.   There was another study that showed taking your blood pressure medications at night decrease cardiovascular events.  However if you are on a fluid pill, please take this in the morning.   If you are willing, our goal BP is the top number of 120.  Your most recent BP: BP: (!) 152/88   Take your medications faithfully as instructed. Maintain a healthy weight. Get at least 150 minutes of aerobic exercise per week. Minimize salt intake. Minimize alcohol intake  DASH Eating Plan DASH stands for "Dietary Approaches to Stop Hypertension." The DASH eating plan is a healthy eating plan that has been shown to reduce high blood pressure (hypertension). Additional health benefits may include reducing the risk of type 2 diabetes mellitus, heart disease, and stroke. The DASH eating plan may also help with weight loss. WHAT DO I NEED TO KNOW ABOUT THE DASH EATING PLAN? For the DASH eating plan, you will follow these general guidelines:  Choose foods with a percent daily value for sodium of less than 5% (as listed on the food label).  Use salt-free seasonings or herbs instead of table salt or sea salt.  Check with your health care provider or pharmacist before using salt substitutes.  Eat lower-sodium products, often labeled as "lower sodium" or "no salt added."  Eat fresh foods.  Eat more vegetables, fruits, and low-fat dairy products.  Choose whole grains. Look for the word "whole" as the first word in the ingredient list.  Choose fish and skinless chicken or [place] more often than red meat. Limit fish, poultry, and meat to 6 oz (170 g) each day.  Limit sweets, desserts,  sugars, and sugary drinks.  Choose heart-healthy fats.  Limit cheese to 1 oz (28 g) per day.  Eat more home-cooked food and less restaurant, buffet, and fast food.  Limit fried foods.  Cook foods using methods other than frying.  Limit canned vegetables. If you do use them, rinse them well to decrease the sodium.  When eating at a restaurant, ask that your food be prepared with less salt, or no salt if possible. WHAT FOODS CAN I EAT? Seek help from a dietitian for individual calorie needs. Grains Whole grain or whole wheat bread. Brown rice. Whole grain or whole wheat pasta. Quinoa, bulgur, and whole grain cereals. Low-sodium cereals. Corn or whole wheat flour tortillas. Whole grain cornbread. Whole grain crackers. Low-sodium crackers. Vegetables Fresh or frozen vegetables (raw, steamed, roasted, or grilled). Low-sodium or reduced-sodium tomato and vegetable juices. Low-sodium or reduced-sodium tomato sauce and paste. Low-sodium or reduced-sodium canned vegetables.  Fruits All fresh, canned (in natural juice), or frozen fruits. Meat and Other Protein Products Ground beef (85% or leaner), grass-fed beef, or beef trimmed of fat. Skinless chicken or [place]. Ground chicken or [place]. Pork trimmed of fat. All fish and seafood. Eggs. Dried beans, peas, or lentils. Unsalted nuts and seeds. Unsalted canned beans. Dairy Low-fat dairy products, such as skim or 1% milk, 2% or reduced-fat cheeses, low-fat ricotta or cottage cheese, or plain low-fat yogurt. Low-sodium or reduced-sodium cheeses. Fats and Oils Tub margarines without trans fats. Light or reduced-fat mayonnaise and salad dressings (reduced sodium). Avocado. Safflower, olive, or canola oils. Natural peanut or almond butter. Other Unsalted popcorn and pretzels. The items listed above may not be a complete list of recommended foods or beverages. Contact your dietitian for more options. WHAT FOODS ARE NOT RECOMMENDED? Grains White  bread. White pasta. White rice. Refined cornbread. Bagels and croissants. Crackers that contain trans fat. Vegetables Creamed or fried vegetables. Vegetables in a cheese sauce. Regular canned vegetables. Regular canned tomato sauce and paste. Regular tomato and vegetable juices. Fruits Dried fruits. Canned fruit in light or heavy syrup. Fruit juice. Meat and Other Protein Products Fatty cuts of meat. Ribs, chicken wings, bacon, sausage, bologna, salami, chitterlings, fatback, hot dogs, bratwurst, and packaged luncheon meats. Salted nuts and seeds. Canned beans with salt. Dairy Whole or 2% milk, cream, half-and-half, and cream cheese. Whole-fat or sweetened yogurt. Full-fat cheeses or blue cheese. Nondairy creamers and whipped toppings. Processed cheese, cheese spreads, or cheese curds. Condiments Onion and garlic salt, seasoned salt, table salt, and sea salt. Canned and packaged gravies. Worcestershire sauce. Tartar sauce. Barbecue sauce. Teriyaki sauce. Soy sauce, including reduced sodium. Steak sauce. Fish sauce. Oyster sauce. Cocktail sauce. Horseradish. Ketchup and mustard. Meat flavorings and tenderizers. Bouillon cubes. Hot sauce. Tabasco sauce. Marinades. Taco seasonings. Relishes. Fats and Oils Butter, stick margarine, lard, shortening, ghee, and bacon fat. Coconut, palm kernel, or palm oils. Regular salad dressings. Other Pickles and olives. Salted popcorn and  pretzels. The items listed above may not be a complete list of foods and beverages to avoid. Contact your dietitian for more information. WHERE CAN I FIND MORE INFORMATION? National Heart, Lung, and Blood Institute: travelstabloid.com Document Released: 12/22/2010 Document Revised: 05/19/2013 Document Reviewed: 11/06/2012 [facility] Patient Information 2015 [place], [place]. This information is not intended to replace advice given to you by your health care provider. Make sure you discuss any questions  you have with your health care provider.   Ask insurance and pharmacy about shingrix - new vaccine   Can go to [email or web address] for more information  Shingrix Vaccination  Two vaccines are licensed and recommended to prevent shingles in the [place] Zoster vaccine live (ZVL, Zostavax) has been in use since 2006. Recombinant zoster vaccine (RZV, Shingrix), has been in use since 2017 and is recommended by ACIP as the preferred shingles vaccine.  What Everyone Should Know about Shingles Vaccine (Shingrix) One of the Recommended Vaccines by Disease Shingles vaccination is the only way to protect against shingles and postherpetic neuralgia (PHN), the most common complication from shingles. CDC recommends that healthy adults 50 years and older get two doses of the shingles vaccine called Shingrix (recombinant zoster vaccine), separated by 2 to 6 months, to prevent shingles and the complications from the disease. Your doctor or pharmacist can give you Shingrix as a shot in your upper arm. Shingrix provides strong protection against shingles and PHN. Two doses of Shingrix is more than 90% effective at preventing shingles and PHN. Protection stays above 85% for at least the first four years after you get vaccinated. Shingrix is the preferred vaccine, over Zostavax (zoster vaccine live), a shingles vaccine in use since 2006. Zostavax may still be used to prevent shingles in healthy adults 60 years and older. For example, you could use Zostavax if a person is allergic to Shingrix, prefers Zostavax, or requests immediate vaccination and Shingrix is unavailable. Who Should Get Shingrix? Healthy adults 50 years and older should get two doses of Shingrix, separated by 2 to 6 months. You should get Shingrix even if in the past you . had shingles  . received Zostavax  . are not sure if you had chickenpox There is no maximum age for getting Shingrix. If you had  shingles in the past, you can get Shingrix to help prevent future occurrences of the disease. There is no specific length of time that you need to wait after having shingles before you can receive Shingrix, but generally you should make sure the shingles rash has gone away before getting vaccinated. You can get Shingrix whether or not you remember having had chickenpox in the past. Studies show that more than 99% of Americans 40 years and older have had chickenpox, even if they don't remember having the disease. Chickenpox and shingles are related because they are caused by the same virus (varicella zoster virus). After a person recovers from chickenpox, the virus stays dormant (inactive) in the body. It can reactivate years later and cause shingles. If you had Zostavax in the recent past, you should wait at least eight weeks before getting Shingrix. Talk to your healthcare provider to determine the best time to get Shingrix. Shingrix is available in [employer] and pharmacies. To find doctor's offices or pharmacies near you that offer the vaccine, visit HealthMap Vaccine FinderExternal. If you have questions about Shingrix, talk with your healthcare provider. Vaccine for Those 72 Years and Older  Shingrix reduces the risk of shingles and PHN by more than  90% in people 31 and older. CDC recommends the vaccine for healthy adults 33 and older.  Who Should Not Get Shingrix? You should not get Shingrix if you: . have ever had a severe allergic reaction to any component of the vaccine or after a dose of Shingrix  . tested negative for immunity to varicella zoster virus. If you test negative, you should get chickenpox vaccine.  . currently have shingles  . currently are pregnant or breastfeeding. Women who are pregnant or breastfeeding should wait to get Shingrix.  Marland Kitchen receive specific antiviral drugs (acyclovir, famciclovir, or valacyclovir) 24 hours before vaccination (avoid use of these antiviral drugs  for 14 days after vaccination)- zoster vaccine live only If you have a minor acute (starts suddenly) illness, such as a cold, you may get Shingrix. But if you have a moderate or severe acute illness, you should usually wait until you recover before getting the vaccine. This includes anyone with a temperature of 101.27F or higher. The side effects of the Shingrix are temporary, and usually last 2 to 3 days. While you may experience pain for a few days after getting Shingrix, the pain will be less severe than having shingles and the complications from the disease. How Well Does Shingrix Work? Two doses of Shingrix provides strong protection against shingles and postherpetic neuralgia (PHN), the most common complication of shingles. . In adults 16 to 59 years old who got two doses, Shingrix was 97% effective in preventing shingles; among adults 70 years and older, Shingrix was 91% effective.  . In adults 31 to 59 years old who got two doses, Shingrix was 91% effective in preventing PHN; among adults 70 years and older, Shingrix was 89% effective. Shingrix protection remained high (more than 85%) in people 70 years and older throughout the four years following vaccination. Since your risk of shingles and PHN increases as you get older, it is important to have strong protection against shingles in your older years. Top of Page  What Are the Possible Side Effects of Shingrix? Studies show that Shingrix is safe. The vaccine helps your body create a strong defense against shingles. As a result, you are likely to have temporary side effects from getting the shots. The side effects may affect your ability to do normal daily activities for 2 to 3 days. Most people got a sore arm with mild or moderate pain after getting Shingrix, and some also had redness and swelling where they got the shot. Some people felt tired, had muscle pain, a headache, shivering, fever, stomach pain, or nausea. About 1 out of 6 people who  got Shingrix experienced side effects that prevented them from doing regular activities. Symptoms went away on their own in about 2 to 3 days. Side effects were more common in younger people. You might have a reaction to the first or second dose of Shingrix, or both doses. If you experience side effects, you may choose to take over-the-counter pain medicine such as ibuprofen or acetaminophen. If you experience side effects from Shingrix, you should report them to the Vaccine Adverse Event Reporting System (VAERS). Your doctor might file this report, or you can do it yourself through the VAERS websiteExternal, or by calling [phone]. If you have any questions about side effects from Shingrix, talk with your doctor. The shingles vaccine does not contain thimerosal (a preservative containing mercury). Top of Page  When Should I See a Doctor Because of the Side Effects I Experience From Shingrix? In clinical trials,  Shingrix was not associated with serious adverse events. In fact, serious side effects from vaccines are extremely rare. For example, for every 1 million doses of a vaccine given, only one or two people may have a severe allergic reaction. Signs of an allergic reaction happen within minutes or hours after vaccination and include hives, swelling of the face and throat, difficulty breathing, a fast heartbeat, dizziness, or weakness. If you experience these or any other life-threatening symptoms, see a doctor right away. Shingrix causes a strong response in your immune system, so it may produce short-term side effects more intense than you are used to from other vaccines. These side effects can be uncomfortable, but they are expected and usually go away on their own in 2 or 3 days. Top of Page  How Can I Pay For Shingrix? There are several ways shingles vaccine may be paid for: Medicare . Medicare Part D plans cover the shingles vaccine, but there may be a cost to you depending on your plan.  There may be a copay for the vaccine, or you may need to pay in full then get reimbursed for a certain amount.  . Medicare Part B does not cover the shingles vaccine. Medicaid . Medicaid may or may not cover the vaccine. Contact your insurer to find out. Private health insurance . Many private health insurance plans will cover the vaccine, but there may be a cost to you depending on your plan. Contact your insurer to find out. Vaccine assistance programs . Some pharmaceutical companies provide vaccines to eligible adults who cannot afford them. You may want to check with the vaccine manufacturer, [employer], about Shingrix. If you do not currently have health insurance, learn more about affordable health coverage optionsExternal. To find doctor's offices or pharmacies near you that offer the vaccine, visit HealthMap Vaccine FinderExternal.  Fatty liver or Nonalcoholic fatty liver disease (NASH)  Now the leading cause of liver failure in the [place].  It is normally from such risk factors as obesity, diabetes, insulin resistance, high cholesterol, or metabolic syndrome.  The only definitive therapy is weight loss and exercise.    Suggest walking 20-30 mins daily.  Decreasing carbohydrates, increasing veggies.  Vitamin E 800 IU a day may be beneficial. Liver cancer has been noted in patient with fatty liver without cirrhosis.  Will monitor closely   Fatty Liver Fatty liver is the accumulation of fat in liver cells. It is also called hepatosteatosis or steatohepatitis. It is normal for your liver to contain some fat. If fat is more than 5 to 10% of your liver's weight, you have fatty liver.  There are often no symptoms (problems) for years while damage is still occurring. People often learn about their fatty liver when they have medical tests for other reasons. Fat can damage your liver for years or even decades without causing problems. When it becomes severe, it can cause fatigue,  weight loss, weakness, and confusion. This makes you more likely to develop more serious liver problems. The liver is the largest organ in the body. It does a lot of work and often gives no warning signs when it is sick until late in a disease. The liver has many important jobs including:  Breaking down foods.  Storing vitamins, iron, and other minerals.  Making proteins.  Making bile for food digestion.  Breaking down many products including medications, alcohol and some poisons.  PROGNOSIS  Fatty liver may cause no damage or it can lead to an inflammation  of the liver. This is, called steatohepatitis.  Over time the liver may become scarred and hardened. This condition is called cirrhosis. Cirrhosis is serious and may lead to liver failure or cancer. NASH is one of the leading causes of cirrhosis. About 10-20% of Americans have fatty liver and a smaller 2-5% has NASH.  TREATMENT   Weight loss, fat restriction, and exercise in overweight patients produces inconsistent results but is worth trying.  Good control of diabetes may reduce fatty liver.  Eat a balanced, healthy diet.  Increase your physical activity.  There are no medical or surgical treatments for a fatty liver or NASH, but improving your diet and increasing your exercise may help prevent or reverse some of the damage.

## 2019-03-22 LAB — COMPLETE METABOLIC PANEL WITH GFR
AG Ratio: 1.4 (calc) (ref 1.0–2.5)
ALT: 67 U/L — ABNORMAL HIGH (ref 9–46)
AST: 34 U/L (ref 10–35)
Albumin: 4.3 g/dL (ref 3.6–5.1)
Alkaline phosphatase (APISO): 86 U/L (ref 35–144)
BUN: 20 mg/dL (ref 7–25)
CO2: 26 mmol/L (ref 20–32)
Calcium: 9.8 mg/dL (ref 8.6–10.3)
Chloride: 103 mmol/L (ref 98–110)
Creat: 1.3 mg/dL (ref 0.70–1.33)
GFR, Est African American: 70 mL/min/{1.73_m2} (ref >=60)
GFR, Est Non African American: 60 mL/min/{1.73_m2} (ref >=60)
Globulin: 3.1 g/dL (calc) (ref 1.9–3.7)
Glucose, Bld: 81 mg/dL (ref 65–99)
Potassium: 4.7 mmol/L (ref 3.5–5.3)
Sodium: 138 mmol/L (ref 135–146)
Total Bilirubin: 0.7 mg/dL (ref 0.2–1.2)
Total Protein: 7.4 g/dL (ref 6.1–8.1)

## 2019-03-22 LAB — URINALYSIS, ROUTINE W REFLEX MICROSCOPIC
Bilirubin Urine: NEGATIVE
Glucose, UA: NEGATIVE
Hgb urine dipstick: NEGATIVE
Ketones, ur: NEGATIVE
Leukocytes,Ua: NEGATIVE
Nitrite: NEGATIVE
Protein, ur: NEGATIVE
Specific Gravity, Urine: 1.01 (ref 1.001–1.03)
pH: 7.5 (ref 5.0–8.0)

## 2019-03-22 LAB — CBC WITH DIFFERENTIAL/PLATELET
Absolute Monocytes: 1548 cells/uL — ABNORMAL HIGH (ref 200–950)
Basophils Absolute: 142 cells/uL (ref 0–200)
Basophils Relative: 1.1 %
Eosinophils Absolute: 439 cells/uL (ref 15–500)
Eosinophils Relative: 3.4 %
HCT: 51.2 % — ABNORMAL HIGH (ref 38.5–50.0)
Hemoglobin: 17.6 g/dL — ABNORMAL HIGH (ref 13.2–17.1)
Lymphs Abs: 2219 cells/uL (ref 850–3900)
MCH: 32.1 pg (ref 27.0–33.0)
MCHC: 34.4 g/dL (ref 32.0–36.0)
MCV: 93.3 fL (ref 80.0–100.0)
MPV: 10.9 fL (ref 7.5–12.5)
Monocytes Relative: 12 %
Neutro Abs: 8553 cells/uL — ABNORMAL HIGH (ref 1500–7800)
Neutrophils Relative %: 66.3 %
Platelets: 274 10*3/uL (ref 140–400)
RBC: 5.49 10*6/uL (ref 4.20–5.80)
RDW: 13.5 % (ref 11.0–15.0)
Total Lymphocyte: 17.2 %
WBC: 12.9 10*3/uL — ABNORMAL HIGH (ref 3.8–10.8)

## 2019-03-22 LAB — HEMOGLOBIN A1C
Hgb A1c MFr Bld: 5.5 % of total Hgb (ref <5.7)
Mean Plasma Glucose: 111 (calc)
eAG (mmol/L): 6.2 (calc)

## 2019-03-22 LAB — MAGNESIUM: Magnesium: 2.4 mg/dL (ref 1.5–2.5)

## 2019-03-22 LAB — VITAMIN D 25 HYDROXY (VIT D DEFICIENCY, FRACTURES): Vit D, 25-Hydroxy: 76 ng/mL (ref 30–100)

## 2019-03-22 LAB — LIPID PANEL
Cholesterol: 142 mg/dL (ref <200)
HDL: 43 mg/dL (ref >=40)
LDL Cholesterol (Calc): 73 mg/dL (calc)
Non-HDL Cholesterol (Calc): 99 mg/dL (calc) (ref <130)
Total CHOL/HDL Ratio: 3.3 (calc) (ref <5.0)
Triglycerides: 180 mg/dL — ABNORMAL HIGH (ref <150)

## 2019-03-22 LAB — MICROALBUMIN / CREATININE URINE RATIO
Creatinine, Urine: 51 mg/dL (ref 20–320)
Microalb Creat Ratio: 4 mcg/mg creat (ref <30)
Microalb, Ur: 0.2 mg/dL

## 2019-03-22 LAB — TSH: TSH: 2.35 mIU/L (ref 0.40–4.50)

## 2019-03-22 LAB — PSA: PSA: 0.2 ng/mL (ref <=4.0)

## 2019-03-22 LAB — TESTOSTERONE: Testosterone: 484 ng/dL (ref 250–827)

## 2019-05-06 ENCOUNTER — Other Ambulatory Visit: Payer: Self-pay | Admitting: Internal Medicine

## 2019-05-06 DIAGNOSIS — E349 Endocrine disorder, unspecified: Secondary | ICD-10-CM

## 2019-07-14 ENCOUNTER — Other Ambulatory Visit: Payer: Self-pay | Admitting: *Deleted

## 2019-07-14 ENCOUNTER — Other Ambulatory Visit: Payer: Self-pay | Admitting: Adult Health

## 2019-07-14 DIAGNOSIS — I10 Essential (primary) hypertension: Secondary | ICD-10-CM

## 2019-07-14 MED ORDER — LOSARTAN POTASSIUM 50 MG PO TABS: ORAL_TABLET | ORAL | 0 refills | Status: DC

## 2019-07-15 ENCOUNTER — Other Ambulatory Visit: Payer: Self-pay | Admitting: Internal Medicine

## 2019-07-15 DIAGNOSIS — J301 Allergic rhinitis due to pollen: Secondary | ICD-10-CM

## 2019-07-15 MED ORDER — MONTELUKAST SODIUM 10 MG PO TABS: ORAL_TABLET | ORAL | 3 refills | Status: DC

## 2019-07-17 ENCOUNTER — Other Ambulatory Visit: Payer: Self-pay | Admitting: *Deleted

## 2019-07-17 MED ORDER — ROSUVASTATIN CALCIUM 40 MG PO TABS: ORAL_TABLET | ORAL | 0 refills | Status: DC

## 2019-07-30 ENCOUNTER — Other Ambulatory Visit: Payer: Self-pay | Admitting: Internal Medicine

## 2019-09-17 DIAGNOSIS — M069 Rheumatoid arthritis, unspecified: Secondary | ICD-10-CM | POA: Diagnosis not present

## 2019-09-17 DIAGNOSIS — Z79899 Other long term (current) drug therapy: Secondary | ICD-10-CM | POA: Diagnosis not present

## 2019-09-17 DIAGNOSIS — R945 Abnormal results of liver function studies: Secondary | ICD-10-CM | POA: Diagnosis not present

## 2019-09-17 DIAGNOSIS — M109 Gout, unspecified: Secondary | ICD-10-CM | POA: Diagnosis not present

## 2019-10-12 ENCOUNTER — Other Ambulatory Visit: Payer: Self-pay | Admitting: Internal Medicine

## 2019-10-24 ENCOUNTER — Other Ambulatory Visit: Payer: Self-pay | Admitting: Internal Medicine

## 2019-10-24 DIAGNOSIS — I10 Essential (primary) hypertension: Secondary | ICD-10-CM

## 2019-12-13 ENCOUNTER — Other Ambulatory Visit: Payer: Self-pay | Admitting: Internal Medicine

## 2019-12-13 DIAGNOSIS — E349 Endocrine disorder, unspecified: Secondary | ICD-10-CM

## 2019-12-18 ENCOUNTER — Telehealth: Payer: Self-pay | Admitting: Internal Medicine

## 2019-12-18 ENCOUNTER — Other Ambulatory Visit: Payer: Self-pay | Admitting: Internal Medicine

## 2019-12-18 ENCOUNTER — Other Ambulatory Visit: Payer: Self-pay | Admitting: Adult Health

## 2019-12-18 DIAGNOSIS — E349 Endocrine disorder, unspecified: Secondary | ICD-10-CM

## 2019-12-18 DIAGNOSIS — N401 Enlarged prostate with lower urinary tract symptoms: Secondary | ICD-10-CM

## 2019-12-18 DIAGNOSIS — I10 Essential (primary) hypertension: Secondary | ICD-10-CM

## 2019-12-18 MED ORDER — "BD LUER-LOK SYRINGE 21G X 1"" 3 ML MISC": 1 refills | Status: DC

## 2019-12-18 MED ORDER — TESTOSTERONE CYPIONATE 200 MG/ML IM SOLN: INTRAMUSCULAR | 1 refills | Status: DC

## 2019-12-18 NOTE — Telephone Encounter (Signed)
Please resend syringe and testosterone cypionate (DEPOTESTOSTERONE CYPIONATE) 200 MG/ML injection [[id number]] [facility]. They said not received by pharm.

## 2020-01-19 ENCOUNTER — Other Ambulatory Visit: Payer: Self-pay | Admitting: *Deleted

## 2020-01-19 MED ORDER — "BD LUER-LOK SYRINGE 21G X 1"" 3 ML MISC": 1 refills | Status: DC

## 2020-01-20 ENCOUNTER — Other Ambulatory Visit: Payer: Self-pay | Admitting: Adult Health

## 2020-02-02 ENCOUNTER — Other Ambulatory Visit: Payer: Self-pay | Admitting: Internal Medicine

## 2020-03-16 DIAGNOSIS — E669 Obesity, unspecified: Secondary | ICD-10-CM | POA: Diagnosis not present

## 2020-03-16 DIAGNOSIS — M109 Gout, unspecified: Secondary | ICD-10-CM | POA: Diagnosis not present

## 2020-03-16 DIAGNOSIS — Z79899 Other long term (current) drug therapy: Secondary | ICD-10-CM | POA: Diagnosis not present

## 2020-03-16 DIAGNOSIS — M069 Rheumatoid arthritis, unspecified: Secondary | ICD-10-CM | POA: Diagnosis not present

## 2020-03-22 NOTE — Progress Notes (Deleted)
Complete Physical  Assessment and Plan:  Encounter for general adult medical examination with abnormal findings 1 year  Essential hypertension - continue medications, DASH diet, exercise and monitor at home. Call if greater than 130/80.  -     CBC with Differential/Platelet -     COMPLETE METABOLIC PANEL WITH GFR -     TSH -     Microalbumin / creatinine urine ratio -     Urinalysis, Routine w reflex microscopic -     EKG 12-Lead  Hyperlipidemia, mixed -     Lipid panel check lipids decrease fatty foods increase activity.   Abnormal glucose -     Hemoglobin A1c Discussed disease progression and risks Discussed diet/exercise, weight management and risk modification  Medication management -     Magnesium  Vitamin D deficiency -     VITAMIN D 25 Hydroxy (Vit-D Deficiency, Fractures)  Rheumatoid arthritis involving multiple sites, unspecified whether rheumatoid factor present ([place]) Continue follow up  Hypogonadism in male -     Testosterone  Idiopathic gout, unspecified chronicity, unspecified site Gout- recheck Uric acid as needed, Diet discussed, continue medications.  Fatty liver Check labs, avoid tylenol, alcohol, weight loss advised.   Obesity (BMI 30.0-34.9) - follow up 3 months for progress monitoring - increase veggies, decrease carbs - long discussion about weight loss, diet, and exercise  Arthritis Monitor  Screening PSA (prostate specific antigen) -     PSA  Discussed med's effects and SE's. Screening labs and tests as requested with regular follow-up as recommended. Over 40 minutes of exam, counseling, chart review and critical decision making was performed Future Appointments  Date Time Provider [place]  03/23/2020  9:00 AM Liane Comber, NP [facility] None    HPI 60 y.o. male patient presents for a complete physical. He has Hypertension; Abnormal glucose; Hyperlipidemia, mixed; Arthritis; Idiopathic gout; Medication management;  Vitamin D deficiency; Obesity (BMI 30.0-34.9); Fatty liver; Hypogonadism in male; and Rheumatoid arthritis involving multiple sites [facility]) on their problem list.   He is followed by Dr. Dossie Der for RA since 2004 and is on enbrel with excellent control.   BMI is There is no height or weight on file to calculate BMI., he is working on diet and exercise. He is on phentermine and topamax for weight loss, has lost 5 lbs.  Wt Readings from Last 3 Encounters:  03/21/19 262 lb 12.8 oz (119.2 kg)  11/05/18 267 lb (121.1 kg)  08/05/18 260 lb (117.9 kg)   His blood pressure is checked by his wife at home, states it is normal there, he did have coffee and phentermine before coming here today, today their BP is  .  He does not workout, takes his walk on a couple of short walks during the day, working from home the past year. He denies chest pain, shortness of breath, dizziness.   He is on cholesterol medication, crestor 40 mg 1/2 M,W,F and denies myalgias. His cholesterol is at goal. The cholesterol last visit was:   Lab Results  Component Value Date   CHOL 142 03/21/2019   HDL 43 03/21/2019   LDLCALC 73 03/21/2019   TRIG 180 (H) 03/21/2019   CHOLHDL 3.3 03/21/2019   He has been working on diet and exercise for glucose management,  Last A1C in the office was:  Lab Results  Component Value Date   HGBA1C 5.5 03/21/2019   Last GFR: Lab Results  Component Value Date   GFRNONAA 60 03/21/2019   GFRNONAA 69  11/05/2018   GFRNONAA 78 08/05/2018   He has a history of testosterone deficiency and is on testosterone replacement. *** He states that the testosterone helps with his energy, libido, muscle mass. Denies symptoms of elevated estrogen or dihydrotestosterone.  Lab Results  Component Value Date   TESTOSTERONE 484 03/21/2019   We are monitoring his LFTs for fatty liver and he did have a gallstone but no evidence of inflammation on [place]- 01/2018  Lab Results  Component Value Date   ALT 67 (H)  03/21/2019   AST 34 03/21/2019   ALKPHOS 69 05/22/2016   BILITOT 0.7 03/21/2019    Patient is on Vitamin D supplement.   Lab Results  Component Value Date   VD25OH 76 03/21/2019     Denies nocturia, LUTs. Last PSA was: Lab Results  Component Value Date   PSA 0.2 03/21/2019   Patient is on allopurinol for gout and does report a recent flare.  Lab Results  Component Value Date   LABURIC 6.0 11/05/2018    Current Medications:  Current Outpatient Medications on File Prior to Visit  Medication Sig Dispense Refill  . allopurinol (ZYLOPRIM) 300 MG tablet Take 300 mg by mouth daily.     Marland Kitchen aspirin EC 81 MG tablet Take 81 mg by mouth daily.    . B-D 3CC LUER-LOK SYR 21GX1" 21G X 1" 3 ML MISC USE EVERY  7 DAYS WITH  TESTOSTERONE INJECTION 50 each 1  . bisoprolol-hydrochlorothiazide (ZIAC) 5-6.25 MG tablet TAKE 1 TABLET BY MOUTH EVERY MORNING FOR BLOOD PRESSURE 90 tablet 1  . Cholecalciferol (VITAMIN D PO) Take 5,000 Units by mouth 2 (two) times daily.    Scarlette Shorts SURECLICK 50 MG/ML injection     . finasteride (PROSCAR) 5 MG tablet TAKE 1 TABLET DAILY FOR    PROSTATE 90 tablet 3  . fluocinonide ointment (LIDEX) 0.05 %     . losartan (COZAAR) 50 MG tablet TAKE 1 TABLET AT NIGHT FOR BLOOD PRESSURE 90 tablet 3  . Magnesium 500 MG TABS Take 1 tablet by mouth daily.    . montelukast (SINGULAIR) 10 MG tablet Take 1 tablet Daily for Allergies & Asthma 90 tablet 3  . Multiple Vitamin (MULTIVITAMIN) tablet Take 1 tablet by mouth daily.    . Omega-3 Fatty Acids (FISH OIL) 1200 MG CAPS Take by mouth 2 (two) times daily.    . phentermine (ADIPEX-P) 37.5 MG tablet TAKE 1/2 TO 1 TABLET DAILY FOR DIETING AND WEIGHT LOSS 90 tablet 1  . rosuvastatin (CRESTOR) 40 MG tablet TAKE 1 TABLET DAILY FOR    CHOLESTEROL 90 tablet 3  . testosterone cypionate (DEPOTESTOSTERONE CYPIONATE) 200 MG/ML injection Inject 1 ml     into Muscle     every 7 days 13 mL 1  . topiramate (TOPAMAX) 50 MG tablet Take    1 tablet       2 x /day     at Suppertime & Bedtime for  Dieting & Weight Loss 180 tablet 3  . vitamin B-12 (CYANOCOBALAMIN) 1000 MCG tablet Take 1,000 mcg by mouth daily.    . vitamin C (ASCORBIC ACID) 500 MG tablet Take 500 mg by mouth 2 (two) times daily.    . Zinc 50 MG TABS Take 1 tablet by mouth daily.     No current facility-administered medications on file prior to visit.   Allergies:  Allergies  Allergen Reactions  . Lisinopril Cough   Health Maintenance:  Immunization History  Administered Date(s) Administered  .  PPD Test 06/23/2013, 10/05/2014, 10/20/2015, 12/27/2016, 01/23/2018  . Tdap 10/05/2014   Tetanus: 2016 Pneumovax: Prevnar 13: Flu vaccine: Does not get Shingrix: discussed Covid 19: ***  [place] AB 01/2018 - mild cholelithiasis ***  Colonoscopy: Never Cologuard 02/15/2017 *** due EGD: never  Eye Exam: [place] [facility]  Dentist:  Patient Care Team: Unk Pinto, MD as PCP - General (Internal Medicine) Valinda Party, MD as Consulting Physician (Rheumatology)  Medical History:  has Hypertension; Abnormal glucose; Hyperlipidemia, mixed; Arthritis; Idiopathic gout; Medication management; Vitamin D deficiency; Obesity (BMI 30.0-34.9); Fatty liver; Hypogonadism in male; and Rheumatoid arthritis involving multiple sites [facility]) on their problem list. Surgical History:  He  has no past surgical history on file. Family History:  His family history includes Heart attack in his half-brother. Unknown- mom and dad both dead Social History:   reports that he has never smoked. He has never used smokeless tobacco. He reports current alcohol use. He reports that he does not use drugs.  Review of Systems:  Review of Systems  Constitutional: Negative for malaise/fatigue and weight loss.  HENT: Negative for hearing loss and tinnitus.   Eyes: Negative for blurred vision and double vision.  Respiratory: Negative for cough, shortness of breath and wheezing.   Cardiovascular: Negative for  chest pain, palpitations, orthopnea, claudication and leg swelling.  Gastrointestinal: Negative for abdominal pain, blood in stool, constipation, diarrhea, heartburn, melena, nausea and vomiting.  Genitourinary: Negative.   Musculoskeletal: Negative for joint pain and myalgias.  Skin: Negative for rash.  Neurological: Negative for dizziness, tingling, sensory change, weakness and headaches.  Endo/Heme/Allergies: Negative for polydipsia.  Psychiatric/Behavioral: Negative.   All other systems reviewed and are negative.   Physical Exam: Estimated body mass index is 36.65 kg/m as calculated from the following:   Height as of 03/21/19: 5\' 11"  (1.803 m).   Weight as of 03/21/19: 262 lb 12.8 oz (119.2 kg). There were no vitals taken for this visit. General Appearance: Well nourished, in no apparent distress.  Eyes: PERRLA, EOMs, conjunctiva no swelling or erythema Sinuses: No Frontal/maxillary tenderness  ENT/Mouth: Ext aud canals clear, normal light reflex with TMs without erythema, bulging. Good dentition. No erythema, swelling, or exudate on post pharynx. Tonsils not swollen or erythematous. Hearing normal.  Neck: Supple, thyroid normal. No bruits  Respiratory: Respiratory effort normal, BS equal bilaterally without rales, rhonchi, wheezing or stridor.  Cardio: RRR without murmurs, rubs or gallops. Brisk peripheral pulses without edema.  Chest: symmetric, with normal excursions and percussion.  Abdomen: Soft, nontender, no guarding, rebound, hernias, masses, or organomegaly.  Lymphatics: Non tender without lymphadenopathy.  Genitourinary:  Musculoskeletal: Full ROM all peripheral extremities,5/5 strength, and normal gait.  Skin: Warm, dry without rashes, lesions, ecchymosis. Neuro: Cranial nerves intact, reflexes equal bilaterally. Normal muscle tone, no cerebellar symptoms. Sensation intact.  Psych: Awake and oriented X 3, normal affect, Insight and Judgment appropriate.   EKG: WNL no  changes.  Gorden Harms Kelani Robart 8:30 AM [facility]

## 2020-03-23 ENCOUNTER — Encounter: Payer: BC Managed Care – PPO | Admitting: Adult Health

## 2020-03-23 DIAGNOSIS — M069 Rheumatoid arthritis, unspecified: Secondary | ICD-10-CM

## 2020-03-23 DIAGNOSIS — Z79899 Other long term (current) drug therapy: Secondary | ICD-10-CM

## 2020-03-23 DIAGNOSIS — Z131 Encounter for screening for diabetes mellitus: Secondary | ICD-10-CM

## 2020-03-23 DIAGNOSIS — Z125 Encounter for screening for malignant neoplasm of prostate: Secondary | ICD-10-CM

## 2020-03-23 DIAGNOSIS — M1000 Idiopathic gout, unspecified site: Secondary | ICD-10-CM

## 2020-03-23 DIAGNOSIS — E669 Obesity, unspecified: Secondary | ICD-10-CM

## 2020-03-23 DIAGNOSIS — R7309 Other abnormal glucose: Secondary | ICD-10-CM

## 2020-03-23 DIAGNOSIS — E291 Testicular hypofunction: Secondary | ICD-10-CM

## 2020-03-23 DIAGNOSIS — Z0000 Encounter for general adult medical examination without abnormal findings: Secondary | ICD-10-CM

## 2020-03-23 DIAGNOSIS — Z1389 Encounter for screening for other disorder: Secondary | ICD-10-CM

## 2020-03-23 DIAGNOSIS — E559 Vitamin D deficiency, unspecified: Secondary | ICD-10-CM

## 2020-03-23 DIAGNOSIS — K760 Fatty (change of) liver, not elsewhere classified: Secondary | ICD-10-CM

## 2020-03-23 DIAGNOSIS — Z136 Encounter for screening for cardiovascular disorders: Secondary | ICD-10-CM

## 2020-03-23 DIAGNOSIS — I10 Essential (primary) hypertension: Secondary | ICD-10-CM

## 2020-03-23 DIAGNOSIS — Z1329 Encounter for screening for other suspected endocrine disorder: Secondary | ICD-10-CM

## 2020-03-23 DIAGNOSIS — E782 Mixed hyperlipidemia: Secondary | ICD-10-CM

## 2020-03-23 IMAGING — US US ABDOMEN COMPLETE
1 series · 14 of 25 positions shown · non-contrast
Comparison: None.

CLINICAL DATA: Elevated liver enzymes.

EXAM:
ABDOMEN ULTRASOUND COMPLETE

[Series 1: us abdomen complete · 0.23mm/px · 14 of 91 slices shown]
[im 1/91]
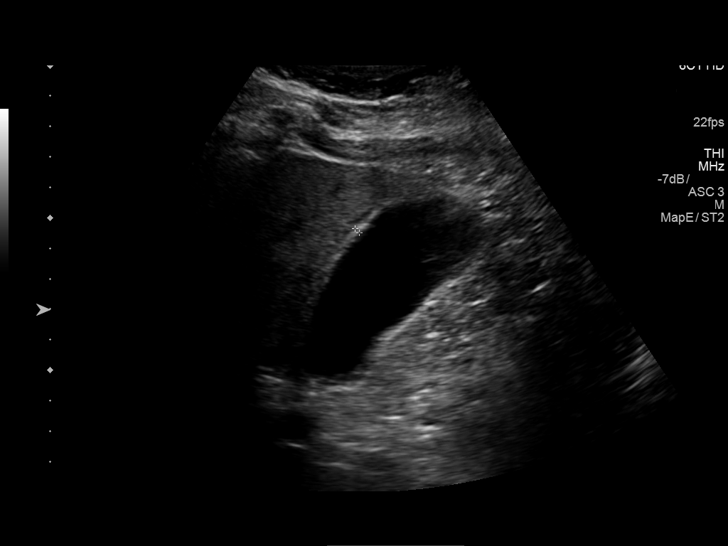
[im 8/91]
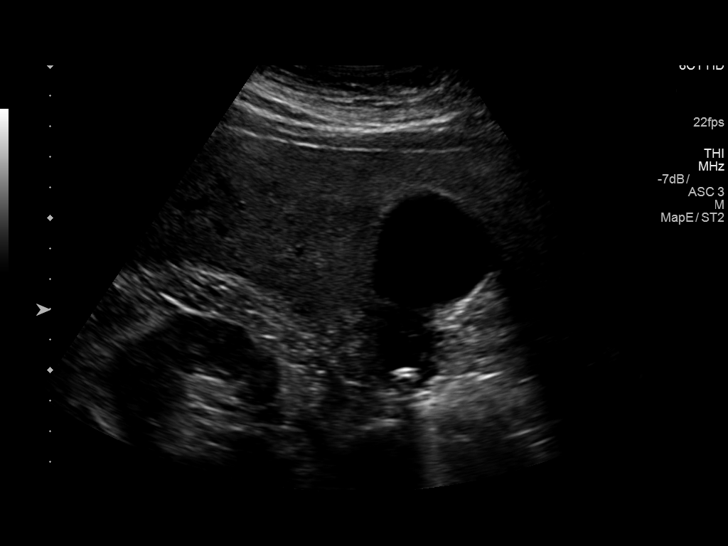
[im 16/91]
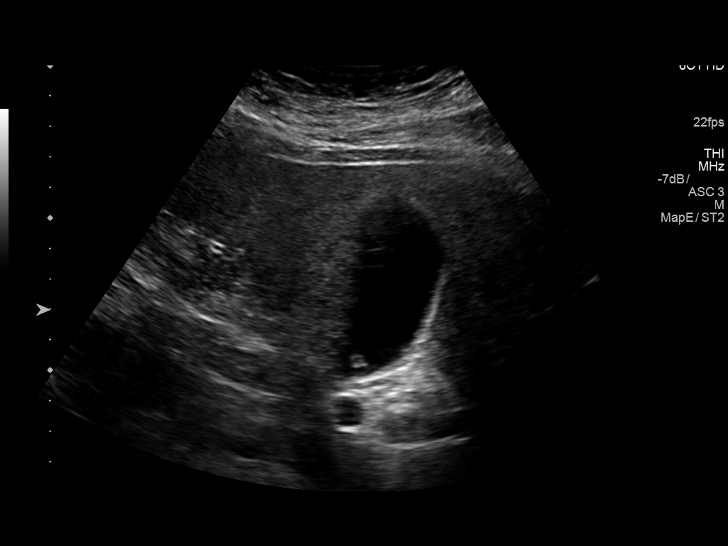
[im 23/91]
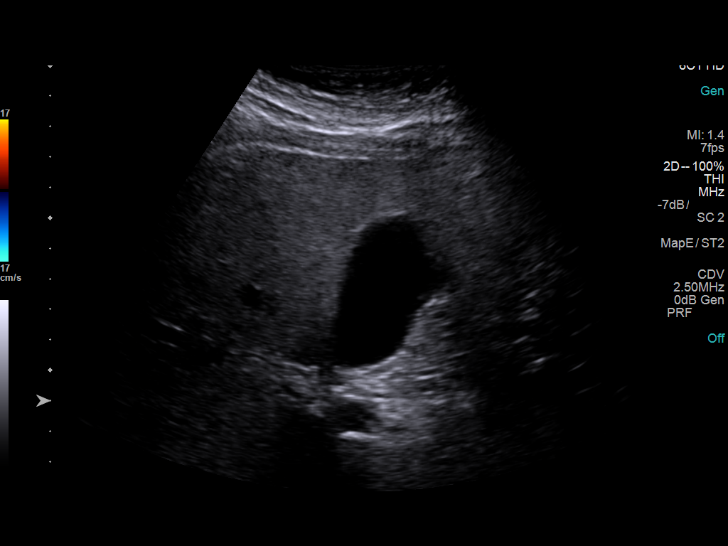
[im 31/91]
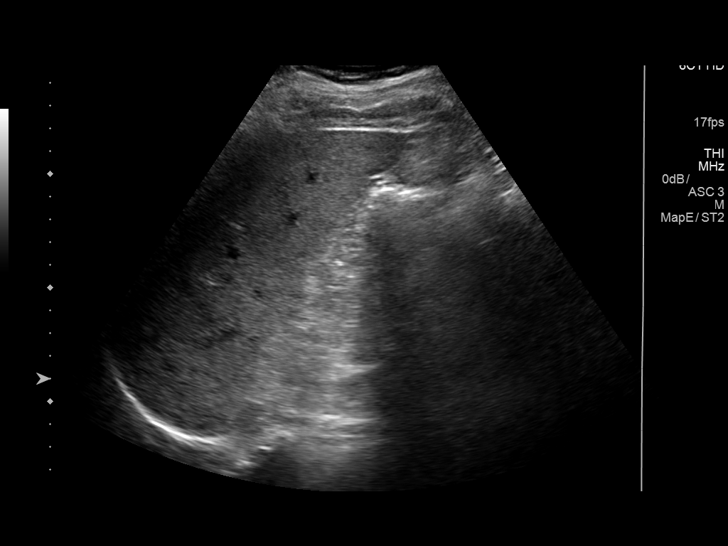
[im 34/91]
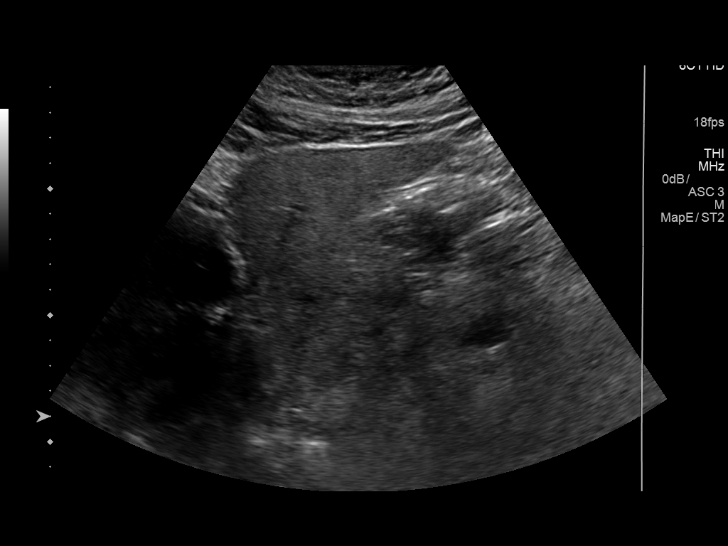
[im 42/91]
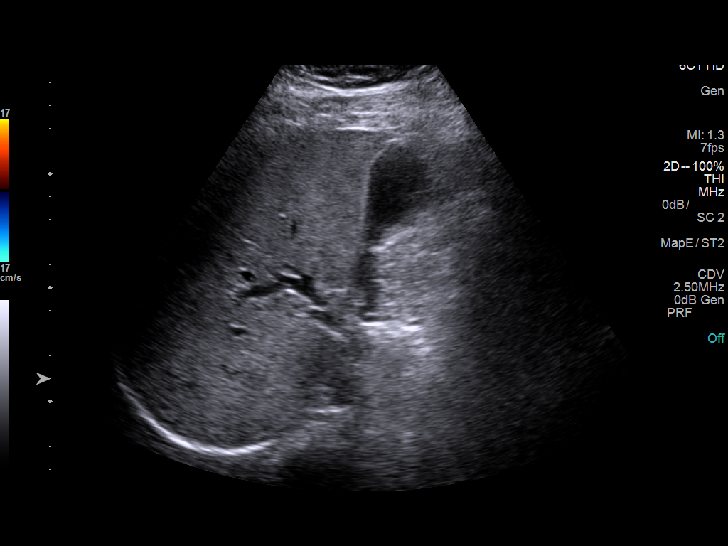
[im 49/91]
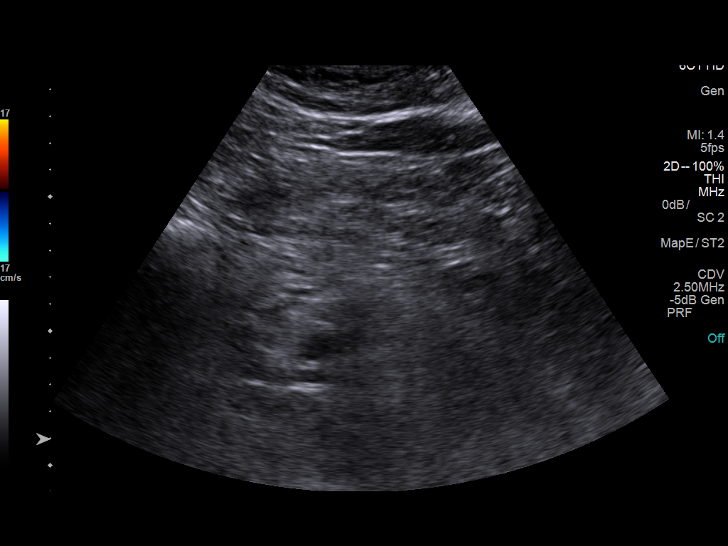
[im 57/91]
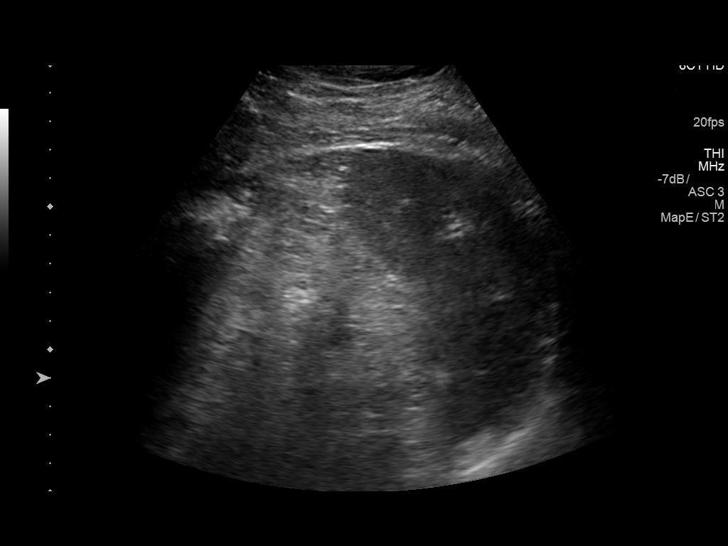
[im 61/91]
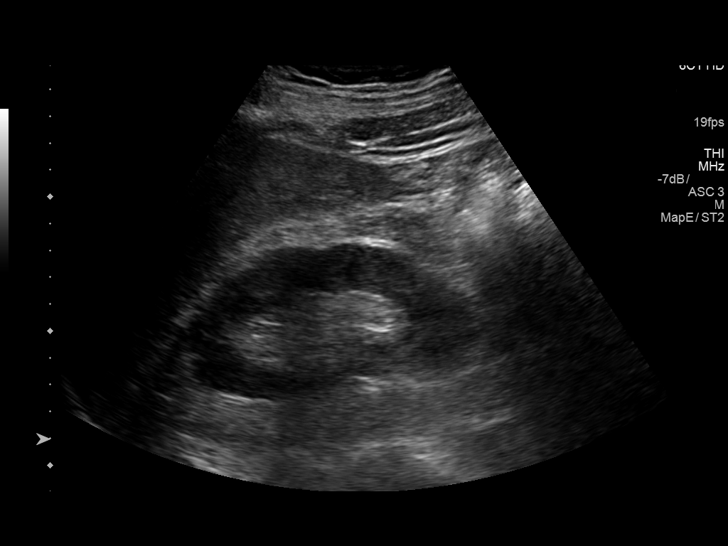
[im 68/91]
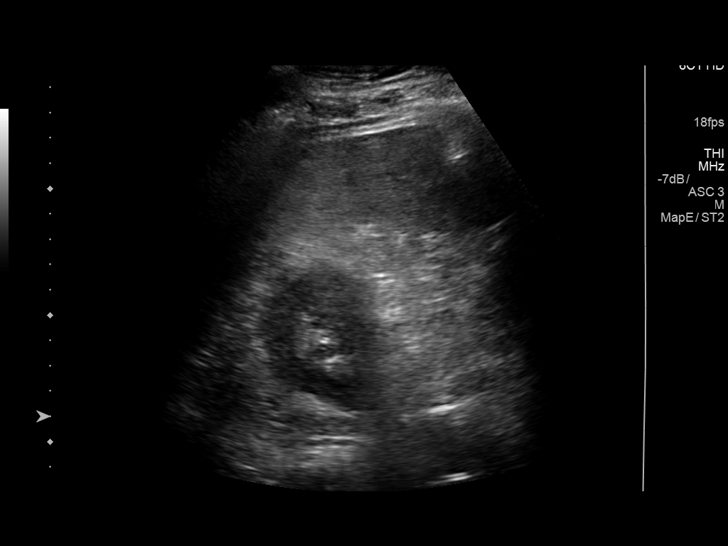
[im 76/91]
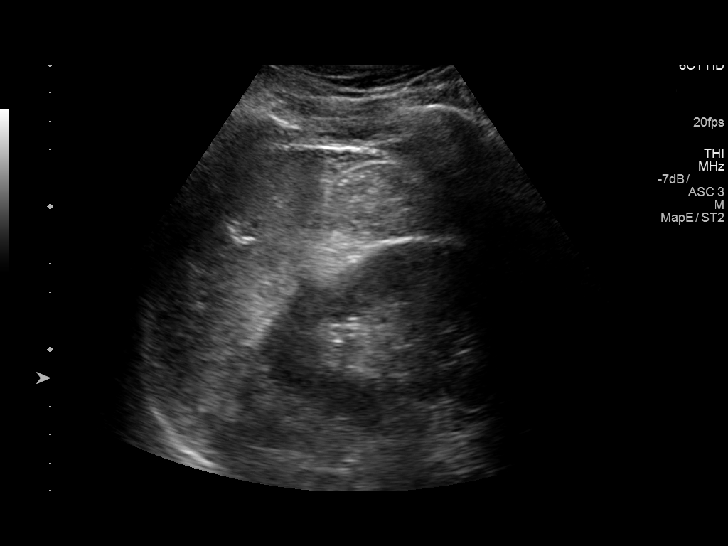
[im 83/91]
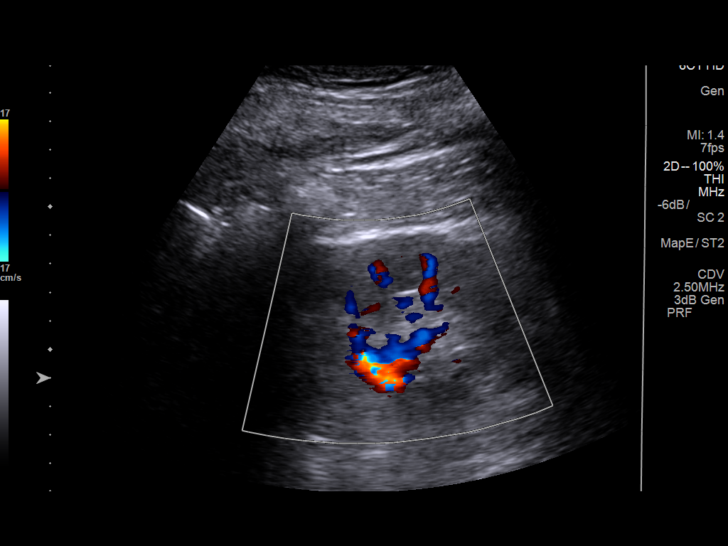
[im 91/91]
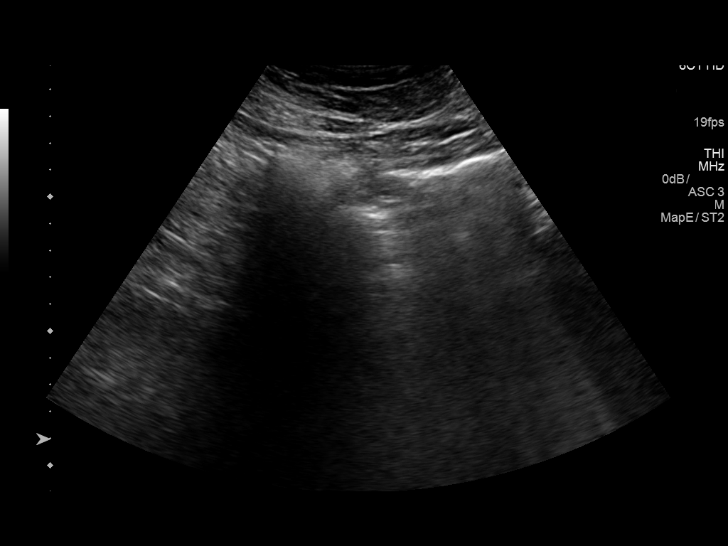

[14 of 25 positions shown; findings below may reference images not displayed]

FINDINGS: Gallbladder: Mild cholelithiasis without wall thickening. Largest
stone measures 8 mm. Negative sonographic Murphy sign. No
pericholecystic fluid.

Common bile duct: Diameter: 3.3 mm.

Liver: Mild heterogeneous parenchymal pattern without focal mass.
Portal vein is patent on color Doppler imaging with normal direction
of blood flow towards the liver.

IVC: No abnormality visualized.

Pancreas: Visualized portion unremarkable.

Spleen: Size and appearance within normal limits.

Right Kidney: Length: 11.2 cm. Echogenicity within normal limits. No
mass or hydronephrosis visualized.

Left Kidney: Length: 11.6 cm. Echogenicity within normal limits. No
mass or hydronephrosis visualized.

Abdominal aorta: No aneurysm visualized.

Other findings: None.
IMPRESSION: Mild cholelithiasis without additional sonographic evidence to
suggest cholecystitis. No acute findings.

## 2020-03-24 ENCOUNTER — Encounter: Payer: BC Managed Care – PPO | Admitting: Adult Health Nurse Practitioner

## 2020-03-31 NOTE — Progress Notes (Signed)
Complete Physical  Assessment and Plan:  Encounter for general adult medical examination with abnormal findings 1 year  Essential hypertension - continue medications, DASH diet, exercise and monitor at home. Call if greater than 130/80.  -     CBC with Differential/Platelet -     COMPLETE METABOLIC PANEL WITH GFR -     TSH -     Microalbumin / creatinine urine ratio -     Urinalysis, Routine w reflex microscopic -     EKG 12-Lead  Hyperlipidemia, mixed -     Lipid panel check lipids decrease fatty foods increase activity.   Abnormal glucose -     Hemoglobin A1c Discussed disease progression and risks Discussed diet/exercise, weight management and risk modification  Medication management -     Magnesium  Vitamin D deficiency -     VITAMIN D 25 Hydroxy (Vit-D Deficiency, Fractures)  Rheumatoid arthritis involving multiple sites, unspecified whether rheumatoid factor present ([place]) Continue follow up, doing well on enbrel, Dr. Dossie Der following  Hypogonadism in male Continued perceived benefits with supplementation, no SE. Check level, continue zinc, no symptoms of elevated estrogen or dihydrotestosterone.  -     Testosterone  Idiopathic gout, unspecified chronicity, unspecified site - recheck Uric acid as needed, Diet discussed, continue medications.  Fatty liver Check labs, avoid tylenol, alcohol, weight loss advised.   Morbid obesity - BMI 35+ htn, hyperlipidemia - follow up 3 months for progress monitoring - increase veggies, decrease carbs - long discussion about weight loss, diet, and exercise - does perceive benefit from phentermine/topamax, take routine breaks - 6 month follow up  Arthritis Monitor  Screening PSA (prostate specific antigen) -     PSA  Orders Placed This Encounter  Procedures  . CBC with Differential/Platelet  . COMPLETE METABOLIC PANEL WITH GFR  . Magnesium  . Lipid panel  . TSH  . Hemoglobin A1c  . VITAMIN D 25 Hydroxy (Vit-D  Deficiency, Fractures)  . Testosterone  . Microalbumin / creatinine urine ratio  . Urinalysis, Routine w reflex microscopic  . Uric acid  . Cologuard  . EKG 12-Lead    Discussed med's effects and SE's. Screening labs and tests as requested with regular follow-up as recommended. Over 40 minutes of exam, counseling, chart review and critical decision making was performed Future Appointments  Date Time Provider [place]  11/02/2020  9:30 AM Liane Comber, NP [facility] None  04/04/2021  2:00 PM Liane Comber, NP [facility] None    HPI 60 y.o. male patient presents for a complete physical. He has Hypertension; Abnormal glucose; Hyperlipidemia, mixed; Arthritis; Idiopathic gout; Medication management; Vitamin D deficiency; Morbid obesity ([place])- BMI 35+ with htn, hyperlipidemia; Fatty liver; Hypogonadism in male; Rheumatoid arthritis involving multiple sites ([place]); and Cholelithiases on their problem list.  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   He has been following with ophth for R eye cataract; has upcoming appointment in April to discuss with Dr. Tommy Rainwater.   He is followed by Dr. Dossie Der for RA since 2004 and is on enbrel with excellent control.   He had [place] abd 01/2018 that showed mild non-obstructing gallstones and likely fatty liver changes.   BMI is Body mass index is 36.1 kg/m., he is working on diet and exercise. He is on phentermine and topamax for weight loss, has lost 4 lbs.  Wt Readings from Last 3 Encounters:  04/01/20 258 lb 12.8 oz (117.4 kg)  03/21/19 262 lb  12.8 oz (119.2 kg)  11/05/18 267 lb (121.1 kg)   His blood pressure is checked by his wife at home, states it is normal there, today their BP is BP: 124/80.  He does not workout. He denies chest pain, shortness of breath, dizziness.   He is on cholesterol medication, crestor 40 mg 1/2 M,W,F and denies myalgias. His cholesterol is at goal. The cholesterol last visit  was:   Lab Results  Component Value Date   CHOL 142 03/21/2019   HDL 43 03/21/2019   LDLCALC 73 03/21/2019   TRIG 180 (H) 03/21/2019   CHOLHDL 3.3 03/21/2019   He has been working on diet and exercise for glucose management,  Last A1C in the office was:  Lab Results  Component Value Date   HGBA1C 5.5 03/21/2019   He reports does use some ibuprofenLast GFR: Lab Results  Component Value Date   GFRNONAA 60 03/21/2019   GFRNONAA 69 11/05/2018   GFRNONAA 78 08/05/2018   He has a history of testosterone deficiency and is on testosterone replacement. Taking 200 mg once weekly, last injection was over 1 weeks ago. He states that the testosterone helps with his energy, libido, muscle mass. Denies symptoms of elevated estrogen or dihydrotestosterone.  Lab Results  Component Value Date   TESTOSTERONE 484 03/21/2019   We are monitoring his LFTs for fatty liver and he did have a gallstone but no evidence of inflammation on [place]- 01/2018  Lab Results  Component Value Date   ALT 67 (H) 03/21/2019   AST 34 03/21/2019   ALKPHOS 69 05/22/2016   BILITOT 0.7 03/21/2019    Patient is on Vitamin D supplement.   Lab Results  Component Value Date   VD25OH 76 03/21/2019     Denies nocturia, LUTs. On finasteride 5 mg   Last PSA was: Lab Results  Component Value Date   PSA 0.2 03/21/2019   Patient is on allopurinol for gout and does not report a recent flare.  Lab Results  Component Value Date   LABURIC 6.0 11/05/2018    Current Medications:  Current Outpatient Medications on File Prior to Visit  Medication Sig Dispense Refill  . allopurinol (ZYLOPRIM) 300 MG tablet Take 300 mg by mouth daily.     Marland Kitchen aspirin EC 81 MG tablet Take 81 mg by mouth daily.    . Cholecalciferol (VITAMIN D PO) Take 5,000 Units by mouth 2 (two) times daily.    Scarlette Shorts SURECLICK 50 MG/ML injection     . fluocinonide ointment (LIDEX) 0.05 %     . losartan (COZAAR) 50 MG tablet TAKE 1 TABLET AT NIGHT FOR BLOOD  PRESSURE 90 tablet 3  . Magnesium 500 MG TABS Take 1 tablet by mouth daily.    . montelukast (SINGULAIR) 10 MG tablet Take 1 tablet Daily for Allergies & Asthma 90 tablet 3  . Multiple Vitamin (MULTIVITAMIN) tablet Take 1 tablet by mouth daily.    . Omega-3 Fatty Acids (FISH OIL) 1200 MG CAPS Take by mouth 2 (two) times daily.    . phentermine (ADIPEX-P) 37.5 MG tablet TAKE 1/2 TO 1 TABLET DAILY FOR DIETING AND WEIGHT LOSS 90 tablet 1  . testosterone cypionate (DEPOTESTOSTERONE CYPIONATE) 200 MG/ML injection Inject 1 ml     into Muscle     every 7 days 13 mL 1  . topiramate (TOPAMAX) 50 MG tablet Take    1 tablet      2 x /day     at Suppertime &  Bedtime for  Dieting & Weight Loss 180 tablet 3  . vitamin B-12 (CYANOCOBALAMIN) 1000 MCG tablet Take 1,000 mcg by mouth daily.    . vitamin C (ASCORBIC ACID) 500 MG tablet Take 500 mg by mouth 2 (two) times daily.    . Zinc 50 MG TABS Take 1 tablet by mouth daily.    . B-D 3CC LUER-LOK SYR 21GX1" 21G X 1" 3 ML MISC USE EVERY  7 DAYS WITH  TESTOSTERONE INJECTION 50 each 1  . bisoprolol-hydrochlorothiazide (ZIAC) 5-6.25 MG tablet TAKE 1 TABLET BY MOUTH EVERY MORNING FOR BLOOD PRESSURE (Patient not taking: Reported on 04/01/2020) 90 tablet 1   No current facility-administered medications on file prior to visit.   Allergies:  Allergies  Allergen Reactions  . Lisinopril Cough   Health Maintenance:  Immunization History  Administered Date(s) Administered  . Moderna Sars-Covid-2 Vaccination 04/21/2019, 05/19/2019  . PPD Test 06/23/2013, 10/05/2014, 10/20/2015, 12/27/2016, 01/23/2018  . Tdap 10/05/2014   Tetanus: 2016 Pneumovax: Prevnar 13: Flu vaccine: Does not get Shingrix: discussed, declines for now Covid 19: 3/3, 2021, modern   [place] AB 01/2018 - mild cholelithiasis  Colonoscopy: Never Cologuard 02/15/2017 EGD: never  Eye Exam: [facility] [facility], upcoming with Dr. Tommy Rainwater for R cataract Dentist: Dr. Dalene Carrow, last 2021, goes  q19m  Patient Care Team: Unk Pinto, MD as PCP - General (Internal Medicine) Valinda Party, MD as Consulting Physician (Rheumatology)  Medical History:  has Hypertension; Abnormal glucose; Hyperlipidemia, mixed; Arthritis; Idiopathic gout; Medication management; Vitamin D deficiency; Morbid obesity ([place])- BMI 35+ with htn, hyperlipidemia; Fatty liver; Hypogonadism in male; Rheumatoid arthritis involving multiple sites [facility]); and Cholelithiases on their problem list. Surgical History:  He  has a past surgical history that includes Wisdom tooth extraction. Family History:  His family history includes Heart attack in his half-brother. Unknown- mom and dad both dead Social History:   reports that he has never smoked. He has never used smokeless tobacco. He reports current alcohol use. He reports that he does not use drugs.  Review of Systems:  Review of Systems  Constitutional: Negative for malaise/fatigue and weight loss.  HENT: Negative for hearing loss and tinnitus.   Eyes: Negative for blurred vision and double vision.  Respiratory: Negative for cough, shortness of breath and wheezing.   Cardiovascular: Negative for chest pain, palpitations, orthopnea, claudication and leg swelling.  Gastrointestinal: Negative for abdominal pain, blood in stool, constipation, diarrhea, heartburn, melena, nausea and vomiting.  Genitourinary: Negative.   Musculoskeletal: Negative for joint pain and myalgias.  Skin: Negative for rash.  Neurological: Negative for dizziness, tingling, sensory change, weakness and headaches.  Endo/Heme/Allergies: Negative for polydipsia.  Psychiatric/Behavioral: Negative.   All other systems reviewed and are negative.   Physical Exam: Estimated body mass index is 36.1 kg/m as calculated from the following:   Height as of this encounter: 5\' 11"  (1.803 m).   Weight as of this encounter: 258 lb 12.8 oz (117.4 kg). BP 124/80   Pulse 95   Temp (!) 97.3 F (36.3 C)    Ht 5\' 11"  (1.803 m)   Wt 258 lb 12.8 oz (117.4 kg)   SpO2 97%   BMI 36.10 kg/m  General Appearance: Well nourished, in no apparent distress.  Eyes: PERRLA, EOMs, conjunctiva no swelling or erythema Sinuses: No Frontal/maxillary tenderness  ENT/Mouth: Ext aud canals clear, normal light reflex with TMs without erythema, bulging. Good dentition. No erythema, swelling, or exudate on post pharynx. Tonsils not swollen or erythematous. Hearing normal.  Neck: Supple, thyroid normal. No bruits  Respiratory: Respiratory effort normal, BS equal bilaterally without rales, rhonchi, wheezing or stridor.  Cardio: RRR without murmurs, rubs or gallops. Brisk peripheral pulses without edema.  Chest: symmetric, with normal excursions and percussion.  Abdomen: Soft, nontender, no guarding, rebound, hernias, masses, or organomegaly.  Lymphatics: Non tender without lymphadenopathy.  Genitourinary: denies concerns, declines Musculoskeletal: Full ROM all peripheral extremities,5/5 strength, and normal gait.  Skin: Warm, dry without rashes, lesions, ecchymosis. Neuro: Cranial nerves intact, reflexes equal bilaterally. Normal muscle tone, no cerebellar symptoms. Sensation intact.  Psych: Awake and oriented X 3, normal affect, Insight and Judgment appropriate.   EKG: WNL no changes.   Thomas Waller 5:40 PM [facility]

## 2020-04-01 ENCOUNTER — Ambulatory Visit (INDEPENDENT_AMBULATORY_CARE_PROVIDER_SITE_OTHER): Payer: BC Managed Care – PPO | Admitting: Adult Health

## 2020-04-01 ENCOUNTER — Encounter: Payer: Self-pay | Admitting: Adult Health

## 2020-04-01 ENCOUNTER — Other Ambulatory Visit: Payer: Self-pay

## 2020-04-01 VITALS — BP 124/80 | HR 95 | Temp 97.3°F | Ht 71.0 in | Wt 258.8 lb

## 2020-04-01 DIAGNOSIS — E559 Vitamin D deficiency, unspecified: Secondary | ICD-10-CM | POA: Diagnosis not present

## 2020-04-01 DIAGNOSIS — Z79899 Other long term (current) drug therapy: Secondary | ICD-10-CM

## 2020-04-01 DIAGNOSIS — K8020 Calculus of gallbladder without cholecystitis without obstruction: Secondary | ICD-10-CM

## 2020-04-01 DIAGNOSIS — Z0000 Encounter for general adult medical examination without abnormal findings: Secondary | ICD-10-CM

## 2020-04-01 DIAGNOSIS — K760 Fatty (change of) liver, not elsewhere classified: Secondary | ICD-10-CM

## 2020-04-01 DIAGNOSIS — Z13220 Encounter for screening for lipoid disorders: Secondary | ICD-10-CM

## 2020-04-01 DIAGNOSIS — E669 Obesity, unspecified: Secondary | ICD-10-CM

## 2020-04-01 DIAGNOSIS — I10 Essential (primary) hypertension: Secondary | ICD-10-CM | POA: Diagnosis not present

## 2020-04-01 DIAGNOSIS — Z1389 Encounter for screening for other disorder: Secondary | ICD-10-CM

## 2020-04-01 DIAGNOSIS — Z136 Encounter for screening for cardiovascular disorders: Secondary | ICD-10-CM | POA: Diagnosis not present

## 2020-04-01 DIAGNOSIS — M1000 Idiopathic gout, unspecified site: Secondary | ICD-10-CM | POA: Diagnosis not present

## 2020-04-01 DIAGNOSIS — E291 Testicular hypofunction: Secondary | ICD-10-CM

## 2020-04-01 DIAGNOSIS — R7309 Other abnormal glucose: Secondary | ICD-10-CM

## 2020-04-01 DIAGNOSIS — M069 Rheumatoid arthritis, unspecified: Secondary | ICD-10-CM

## 2020-04-01 DIAGNOSIS — Z1329 Encounter for screening for other suspected endocrine disorder: Secondary | ICD-10-CM

## 2020-04-01 DIAGNOSIS — E782 Mixed hyperlipidemia: Secondary | ICD-10-CM

## 2020-04-01 DIAGNOSIS — Z1211 Encounter for screening for malignant neoplasm of colon: Secondary | ICD-10-CM

## 2020-04-01 DIAGNOSIS — Z131 Encounter for screening for diabetes mellitus: Secondary | ICD-10-CM | POA: Diagnosis not present

## 2020-04-01 MED ORDER — ROSUVASTATIN CALCIUM 20 MG PO TABS: 20.0000 mg | ORAL_TABLET | Freq: Every day | ORAL | 3 refills | Status: DC

## 2020-04-02 LAB — CBC WITH DIFFERENTIAL/PLATELET
Absolute Monocytes: 1097 cells/uL — ABNORMAL HIGH (ref 200–950)
Basophils Absolute: 149 cells/uL (ref 0–200)
Basophils Relative: 1.6 %
Eosinophils Absolute: 502 cells/uL — ABNORMAL HIGH (ref 15–500)
Eosinophils Relative: 5.4 %
HCT: 49.9 % (ref 38.5–50.0)
Hemoglobin: 17.1 g/dL (ref 13.2–17.1)
Lymphs Abs: 3013 cells/uL (ref 850–3900)
MCH: 32.3 pg (ref 27.0–33.0)
MCHC: 34.3 g/dL (ref 32.0–36.0)
MCV: 94.3 fL (ref 80.0–100.0)
MPV: 10.7 fL (ref 7.5–12.5)
Monocytes Relative: 11.8 %
Neutro Abs: 4538 cells/uL (ref 1500–7800)
Neutrophils Relative %: 48.8 %
Platelets: 265 10*3/uL (ref 140–400)
RBC: 5.29 10*6/uL (ref 4.20–5.80)
RDW: 13.3 % (ref 11.0–15.0)
Total Lymphocyte: 32.4 %
WBC: 9.3 10*3/uL (ref 3.8–10.8)

## 2020-04-02 LAB — COMPLETE METABOLIC PANEL WITH GFR
AG Ratio: 1.7 (calc) (ref 1.0–2.5)
ALT: 67 U/L — ABNORMAL HIGH (ref 9–46)
AST: 35 U/L (ref 10–35)
Albumin: 4.5 g/dL (ref 3.6–5.1)
Alkaline phosphatase (APISO): 73 U/L (ref 35–144)
BUN/Creatinine Ratio: 18 (calc) (ref 6–22)
BUN: 24 mg/dL (ref 7–25)
CO2: 28 mmol/L (ref 20–32)
Calcium: 9.8 mg/dL (ref 8.6–10.3)
Chloride: 105 mmol/L (ref 98–110)
Creat: 1.36 mg/dL — ABNORMAL HIGH (ref 0.70–1.33)
GFR, Est African American: 66 mL/min/{1.73_m2} (ref >=60)
GFR, Est Non African American: 57 mL/min/{1.73_m2} — ABNORMAL LOW (ref >=60)
Globulin: 2.7 g/dL (calc) (ref 1.9–3.7)
Glucose, Bld: 86 mg/dL (ref 65–99)
Potassium: 4.9 mmol/L (ref 3.5–5.3)
Sodium: 140 mmol/L (ref 135–146)
Total Bilirubin: 0.5 mg/dL (ref 0.2–1.2)
Total Protein: 7.2 g/dL (ref 6.1–8.1)

## 2020-04-02 LAB — MICROALBUMIN / CREATININE URINE RATIO
Creatinine, Urine: 50 mg/dL (ref 20–320)
Microalb Creat Ratio: 4 mcg/mg creat (ref <30)
Microalb, Ur: 0.2 mg/dL

## 2020-04-02 LAB — VITAMIN D 25 HYDROXY (VIT D DEFICIENCY, FRACTURES): Vit D, 25-Hydroxy: 76 ng/mL (ref 30–100)

## 2020-04-02 LAB — TSH: TSH: 3.02 mIU/L (ref 0.40–4.50)

## 2020-04-02 LAB — URINALYSIS, ROUTINE W REFLEX MICROSCOPIC
Bilirubin Urine: NEGATIVE
Glucose, UA: NEGATIVE
Hgb urine dipstick: NEGATIVE
Ketones, ur: NEGATIVE
Leukocytes,Ua: NEGATIVE
Nitrite: NEGATIVE
Protein, ur: NEGATIVE
Specific Gravity, Urine: 1.01 (ref 1.001–1.03)
pH: 7 (ref 5.0–8.0)

## 2020-04-02 LAB — LIPID PANEL
Cholesterol: 151 mg/dL (ref <200)
HDL: 46 mg/dL (ref >=40)
LDL Cholesterol (Calc): 75 mg/dL (calc)
Non-HDL Cholesterol (Calc): 105 mg/dL (calc) (ref <130)
Total CHOL/HDL Ratio: 3.3 (calc) (ref <5.0)
Triglycerides: 205 mg/dL — ABNORMAL HIGH (ref <150)

## 2020-04-02 LAB — TESTOSTERONE: Testosterone: 198 ng/dL — ABNORMAL LOW (ref 250–827)

## 2020-04-02 LAB — MAGNESIUM: Magnesium: 2.3 mg/dL (ref 1.5–2.5)

## 2020-04-02 LAB — HEMOGLOBIN A1C
Hgb A1c MFr Bld: 5.4 % of total Hgb (ref <5.7)
Mean Plasma Glucose: 108 mg/dL
eAG (mmol/L): 6 mmol/L

## 2020-04-02 LAB — URIC ACID: Uric Acid, Serum: 5.4 mg/dL (ref 4.0–8.0)

## 2020-04-16 HISTORY — PX: CATARACT EXTRACTION: SUR2

## 2020-04-19 ENCOUNTER — Encounter: Payer: Self-pay | Admitting: Adult Health

## 2020-04-19 ENCOUNTER — Ambulatory Visit (INDEPENDENT_AMBULATORY_CARE_PROVIDER_SITE_OTHER): Payer: BC Managed Care – PPO | Admitting: Adult Health

## 2020-04-19 ENCOUNTER — Other Ambulatory Visit: Payer: Self-pay

## 2020-04-19 VITALS — BP 128/100 | HR 88 | Temp 97.3°F | Ht 71.0 in | Wt 260.0 lb

## 2020-04-19 DIAGNOSIS — J0100 Acute maxillary sinusitis, unspecified: Secondary | ICD-10-CM

## 2020-04-19 MED ORDER — PROMETHAZINE-DM 6.25-15 MG/5ML PO SYRP: 5.0000 mL | ORAL_SOLUTION | Freq: Four times a day (QID) | ORAL | 1 refills | Status: DC | PRN

## 2020-04-19 MED ORDER — AMOXICILLIN-POT CLAVULANATE 875-125 MG PO TABS: 1.0000 | ORAL_TABLET | Freq: Two times a day (BID) | ORAL | 0 refills | Status: AC

## 2020-04-19 MED ORDER — PREDNISONE 20 MG PO TABS: ORAL_TABLET | ORAL | 0 refills | Status: DC

## 2020-04-19 NOTE — Progress Notes (Signed)
Assessment and Plan:  Burdett was seen today for acute visit.  Diagnoses and all orders for this visit:  Acute non-recurrent maxillary sinusitis Covid 19 neg Hx of annual sinus sx around this time Denies hx of allergies -no specific sx, ? Need for singulaire, try stopping once sx resolved Discussed the importance of avoiding unnecessary antibiotic  Therapy - mild sx, day 3, most likely viral from hx Suggested symptomatic OTC remedies. Nasal saline spray for congestion. Encouraged nasal steroids, oral steroids offered Start antibiotic only if no improvement with steroid in 2-3 days Follow up as needed. -     predniSONE (DELTASONE) 20 MG tablet; 2 tablets daily for 3 days, 1 tablet daily for 4 days. -     amoxicillin-clavulanate (AUGMENTIN) 875-125 MG tablet; Take 1 tablet by mouth 2 (two) times daily for 10 days. -     promethazine-dextromethorphan (PROMETHAZINE-DM) 6.25-15 MG/5ML syrup; Take 5 mLs by mouth 4 (four) times daily as needed for cough.   Further disposition pending results of labs. Discussed med's effects and SE's.   Over 15 minutes of exam, counseling, chart review, and critical decision making was performed.   Future Appointments  Date Time Provider [place]  11/02/2020  9:30 AM Liane Comber, NP [facility] None  04/04/2021  2:00 PM Liane Comber, NP [facility] None    ------------------------------------------------------------------------------------------------------------------   HPI BP (!) 128/100   Pulse 88   Temp (!) 97.3 F (36.3 C)   Ht 5\' 11"  (1.803 m)   Wt 260 lb (117.9 kg)   SpO2 95%   BMI 36.26 kg/m   60 y.o.male presents for e valuation of sinus/URI. He did have negative rapid covid 19 test today.   He reports sx began 3 days ago with nasal/sinus congestion, was having allergies in recent weeks and thought this was the cause, then progressive, scratchy throat, then became much worse, brain fog, very fatigued, sleeping all day long,  last night was worse, post nasal drip and started coughing (dry, non-productive), kept waking him up, slept in the recliner last night. Did have sense of chills last night. Denies HA but "severe pressure like bowling ball." Denies dyspnea, chest aching. Does endorse aching, but attributes to being off of enbrel.   He notes prone to sinus infections once annually about this time of the year, hadn't had since pandemic.   He started restart taking singulair daily (not sure why he is prescribed this, denies hx of allergies, only annual sinusitis sx, was started in 2020), has been taking sudafed and took nyquil.  He has been doing saline nasal mist, also flonase but has only tried once or twice, unsure if this helped.  Didn't take any antihistamine.    Past Medical History:  Diagnosis Date  . Arthritis    Reiters Polyarthritis  . Cholelithiases 02/04/2018   Per Abd [place] 01/2018 with fatty liver, no inflammation or obstruction  . Elevated hemoglobin A1c measurement   . Gout   . Hyperlipidemia   . Hypertension   . Rheumatoid arthritis (HCC)      Allergies  Allergen Reactions  . Lisinopril Cough    Current Outpatient Medications on File Prior to Visit  Medication Sig  . allopurinol (ZYLOPRIM) 300 MG tablet Take 300 mg by mouth daily.   Marland Kitchen aspirin EC 81 MG tablet Take 81 mg by mouth daily.  . B-D 3CC LUER-LOK SYR 21GX1" 21G X 1" 3 ML MISC USE EVERY  7 DAYS WITH  TESTOSTERONE INJECTION  . bisoprolol-hydrochlorothiazide [facility])  5-6.25 MG tablet TAKE 1 TABLET BY MOUTH EVERY MORNING FOR BLOOD PRESSURE  . Cholecalciferol (VITAMIN D PO) Take 5,000 Units by mouth 2 (two) times daily.  Scarlette Shorts SURECLICK 50 MG/ML injection   . fluocinonide ointment (LIDEX) 0.05 %   . losartan (COZAAR) 50 MG tablet TAKE 1 TABLET AT NIGHT FOR BLOOD PRESSURE  . Magnesium 500 MG TABS Take 1 tablet by mouth daily.  . montelukast (SINGULAIR) 10 MG tablet Take 1 tablet Daily for Allergies & Asthma  . Multiple Vitamin  (MULTIVITAMIN) tablet Take 1 tablet by mouth daily.  . Omega-3 Fatty Acids (FISH OIL) 1200 MG CAPS Take by mouth 2 (two) times daily.  . phentermine (ADIPEX-P) 37.5 MG tablet TAKE 1/2 TO 1 TABLET DAILY FOR DIETING AND WEIGHT LOSS  . rosuvastatin (CRESTOR) 20 MG tablet Take 1 tablet (20 mg total) by mouth daily. for cholesterol  . testosterone cypionate (DEPOTESTOSTERONE CYPIONATE) 200 MG/ML injection Inject 1 ml     into Muscle     every 7 days  . topiramate (TOPAMAX) 50 MG tablet Take    1 tablet      2 x /day     at Suppertime & Bedtime for  Dieting & Weight Loss  . vitamin B-12 (CYANOCOBALAMIN) 1000 MCG tablet Take 1,000 mcg by mouth daily.  . vitamin C (ASCORBIC ACID) 500 MG tablet Take 500 mg by mouth 2 (two) times daily.  . Zinc 50 MG TABS Take 1 tablet by mouth daily.   No current facility-administered medications on file prior to visit.    ROS: all negative except above.   Physical Exam:  BP (!) 128/100   Pulse 88   Temp (!) 97.3 F (36.3 C)   Ht 5\' 11"  (1.803 m)   Wt 260 lb (117.9 kg)   SpO2 95%   BMI 36.26 kg/m   General Appearance: Well nourished, in no apparent distress. Eyes: PERRLA, EOMs, conjunctiva no swelling or erythema Sinuses: No Frontal/maxillary tenderness, erythema or swelling, "pressure"  ENT/Mouth: Ext aud canals clear, TMs without erythema, bulging. No erythema, swelling, or exudate on post pharynx.  Tonsils not swollen or erythematous. Hearing normal.  Neck: Supple Respiratory: Respiratory effort normal, BS equal bilaterally without rales, rhonchi, wheezing or stridor.  Cardio: RRR with no MRGs. Brisk peripheral pulses without edema.  Abdomen: Soft, + BS.  Non tender Lymphatics: Non tender without lymphadenopathy.  Musculoskeletal:  normal gait.  Skin: Warm, dry without rashes, lesions, ecchymosis.  Neuro: Cranial nerves intact. Normal muscle tone Psych: Awake and oriented X 3, normal affect, Insight and Judgment appropriate.     Izora Ribas,  NP 12:28 PM [facility]

## 2020-04-19 NOTE — Patient Instructions (Signed)
  Please start prednisone  Continue singulair - add allergy med such as allegra, zyrtec, etc for at least 2 weeks, then stop but resume if seems to be any rebound syptoms with stopping  If not improving with prednisone in 2-3 days, THEN start augmentin - 90% of sinusitis doesn't need antibiotic if can get inflammation controlled and sinuses to drain  Please STOP sudafed due to high blood pressure  Continue saline sprays, can also try nasal saline irrigation  Please do flonase 1-2 sprays twice daily while congestion symptoms continue   Promethazine DM as needed for cough               Medicines you can use  Nasal congestion  Little Remedies saline spray (aerosol/mist)- can try this, it is in the kids section - pseudoephedrine (Sudafed)- behind the counter, do not use if you have high blood pressure, medicine that have -D in them.  - phenylephrine (Sudafed PE) -Dextormethorphan + chlorpheniramine (Coridcidin HBP)- okay if you have high blood pressure -Oxymetazoline (Afrin) nasal spray- LIMIT to 3 days -Saline nasal spray -Neti pot (used distilled or bottled water)  Ear pain/congestion  -pseudoephedrine (sudafed) - Nasonex/flonase nasal spray  Fever  -Acetaminophen (Tyelnol) -Ibuprofen (Advil, motrin, aleve)  Sore Throat  -Acetaminophen (Tyelnol) -Ibuprofen (Advil, motrin, aleve) -Drink a lot of water -Gargle with salt water - Rest your voice (don't talk) -Throat sprays -Cough drops  Body Aches  -Acetaminophen (Tyelnol) -Ibuprofen (Advil, motrin, aleve)  Headache  -Acetaminophen (Tyelnol) -Ibuprofen (Advil, motrin, aleve) - Exedrin, Exedrin Migraine  Allergy symptoms (cough, sneeze, runny nose, itchy eyes) -Claritin or loratadine cheapest but likely the weakest  -Zyrtec or certizine at night because it can make you sleepy -The strongest is allegra or fexafinadine  Cheapest at [employer], [employer], [employer]  Cough  -Dextromethorphan (Delsym)- medicine that  has DM in it -Guafenesin (Mucinex/Robitussin) - cough drops - drink lots of water  Chest Congestion  -Guafenesin (Mucinex/Robitussin)  Red Itchy Eyes  - Naphcon-A  Upset Stomach  - Bland diet (nothing spicy, greasy, fried, and high acid foods like tomatoes, oranges, berries) -OKAY- cereal, bread, soup, crackers, rice -Eat smaller more frequent meals -reduce caffeine, no alcohol -Loperamide (Imodium-AD) if diarrhea -Prevacid for heart burn  General health when sick  -Hydration -wash your hands frequently -keep surfaces clean -change pillow cases and sheets often -Get fresh air but do not exercise strenuously -Vitamin D, double up on it - Vitamin C -Zinc

## 2020-04-20 DIAGNOSIS — H40013 Open angle with borderline findings, low risk, bilateral: Secondary | ICD-10-CM | POA: Diagnosis not present

## 2020-04-20 DIAGNOSIS — H2513 Age-related nuclear cataract, bilateral: Secondary | ICD-10-CM | POA: Diagnosis not present

## 2020-04-20 DIAGNOSIS — H25013 Cortical age-related cataract, bilateral: Secondary | ICD-10-CM | POA: Diagnosis not present

## 2020-04-20 DIAGNOSIS — H2511 Age-related nuclear cataract, right eye: Secondary | ICD-10-CM | POA: Diagnosis not present

## 2020-04-20 DIAGNOSIS — H25043 Posterior subcapsular polar age-related cataract, bilateral: Secondary | ICD-10-CM | POA: Diagnosis not present

## 2020-05-14 DIAGNOSIS — H2512 Age-related nuclear cataract, left eye: Secondary | ICD-10-CM | POA: Diagnosis not present

## 2020-05-14 DIAGNOSIS — H2511 Age-related nuclear cataract, right eye: Secondary | ICD-10-CM | POA: Diagnosis not present

## 2020-05-16 HISTORY — PX: CATARACT EXTRACTION: SUR2

## 2020-06-07 DIAGNOSIS — H2512 Age-related nuclear cataract, left eye: Secondary | ICD-10-CM | POA: Diagnosis not present

## 2020-06-28 ENCOUNTER — Other Ambulatory Visit: Payer: Self-pay | Admitting: Adult Health

## 2020-06-28 DIAGNOSIS — E349 Endocrine disorder, unspecified: Secondary | ICD-10-CM

## 2020-07-06 ENCOUNTER — Other Ambulatory Visit: Payer: Self-pay | Admitting: Internal Medicine

## 2020-07-06 DIAGNOSIS — J301 Allergic rhinitis due to pollen: Secondary | ICD-10-CM

## 2020-08-07 ENCOUNTER — Other Ambulatory Visit: Payer: Self-pay | Admitting: Internal Medicine

## 2020-09-22 DIAGNOSIS — Z79899 Other long term (current) drug therapy: Secondary | ICD-10-CM | POA: Diagnosis not present

## 2020-09-22 DIAGNOSIS — M109 Gout, unspecified: Secondary | ICD-10-CM | POA: Diagnosis not present

## 2020-09-22 DIAGNOSIS — E669 Obesity, unspecified: Secondary | ICD-10-CM | POA: Diagnosis not present

## 2020-09-22 DIAGNOSIS — M069 Rheumatoid arthritis, unspecified: Secondary | ICD-10-CM | POA: Diagnosis not present

## 2020-10-29 NOTE — Progress Notes (Signed)
FOLLOW UP  Assessment and Plan:   Hypertension Continue medications; reports controlled at home but persistently elevated today;   Monitor blood pressure at home; patient to call if consistently greater than 130/80 Reviewed DASH diet, weight loss encouraged Follow up [place] in 4-6 weeks to recheck   Reminder to go to the ER if any CP, SOB, nausea, dizziness, severe HA, changes vision/speech, left arm numbness and tingling and jaw pain.  Cholesterol Currently at LDL goal;  Continue low cholesterol diet and exercise.  Check lipid panel.   Other abnormal glucose Recent A1Cs at goal Discussed diet/exercise, weight management  Defer A1C; check CMP  Morbid Obesity ([place]) - BMI 36 with htn, hld, gout Long discussion about weight loss, diet, and exercise Recommended diet heavy in fruits and veggies and low in animal meats, cheeses, and dairy products, appropriate calorie intake Discussed ideal weight for height Patient on phentermine, advised to take a break every 2-3 months, try lower dose of topamax at dinner Reviewed diet in length; recommend he focus on reducing processed carbohydrate intake, Stop soda, biscuits High fiber diet information reviewed and handout given Discussed 3 month OV for accountability, patient declines at this time.   Vitamin D Def At goal at last visit; continue supplementation to maintain goal of 70-100 Defer Vit D level  Hypogonadism-  continue to monitor, states medication is helping with symptoms of low T. Check testosterone levels at CPE  Gout Continue allopurinol Diet discussed Check uric acid as needed  Elevated LFTs/fatty liver Hepatitis panel has been checked and negative Fatty liver on [place] Weigth loss encouraged; Low processed carb diet and fatty liver discussed  Rheumatoid arthritis Followed by Dr. Dossie Der; well controlled on enbrel    Continue diet and meds as discussed. Further disposition pending results of labs. Discussed med's effects and  SE's.   Over 30 minutes of exam, counseling, chart review, and critical decision making was performed.   Future Appointments  Date Time Provider [place]  04/04/2021  2:00 PM Magda Bernheim, NP [facility] None    ----------------------------------------------------------------------------------------------------------------------  HPI 60 y.o. male  presents for 6 month follow up on hypertension, cholesterol, glucose management, obesity, hypogonadism and vitamin D deficiency.   He recently had bil cataracts by Dr. Tommy Rainwater in [place] 2022 and reports has done well since.   He is followed by Dr. Dossie Der for RA since 2004 and is on enbrel with excellent control.   He had [place] abd 01/2018 that showed mild non-obstructing gallstones and likely fatty liver changes.   BMI is Body mass index is 36.26 kg/m., he is working on diet and exercise. Tries to get out and work in yard an hour most days.  He has been on phentermine and topamax with perceived benefit, trying to walk  Wt Readings from Last 3 Encounters:  11/02/20 260 lb (117.9 kg)  04/19/20 260 lb (117.9 kg)  04/01/20 258 lb 12.8 oz (117.4 kg)   He does check BP occasionally at home, reports "normal", today their BP is BP: (!) 144/98, similar on recheck  He does not workout. He denies chest pain, shortness of breath, dizziness.    He is on cholesterol medication (rosuvastatin 20 mg on MWF) and denies myalgias. His LDL cholesterol is at goal; his trigs remain mildly elevated. The cholesterol last visit was:  Lab Results  Component Value Date   CHOL 151 04/01/2020   HDL 46 04/01/2020   LDLCALC 75 04/01/2020   TRIG 205 (H) 04/01/2020   CHOLHDL  3.3 04/01/2020    He has not been working on diet and exercise for glucose management, and denies foot ulcerations, increased appetite, nausea, paresthesia of the feet, polydipsia, polyuria, visual disturbances, vomiting and weight loss. Last A1C in the office was:  Lab Results  Component Value  Date   HGBA1C 5.4 04/01/2020   Patient is on Vitamin D supplement and at goal at last check:    Lab Results  Component Value Date   VD25OH 76 04/01/2020     Patient is on allopurinol for gout and does not report a recent flare.  Lab Results  Component Value Date   LABURIC 5.4 04/01/2020   He has a history of testosterone deficiency and is on testosterone replacement, taking 400 mg every 2 weeks; last injection was 3 days ago. He states that the testosterone helps with his energy, libido, muscle mass. Lab Results  Component Value Date   TESTOSTERONE 198 (L) 04/01/2020   He has known fatty liver, LFTs monitored routinely through this office:  Lab Results  Component Value Date   ALT 67 (H) 04/01/2020   AST 35 04/01/2020   ALKPHOS 69 05/22/2016   BILITOT 0.5 04/01/2020     Current Medications:  Current Outpatient Medications on File Prior to Visit  Medication Sig   allopurinol (ZYLOPRIM) 300 MG tablet Take 300 mg by mouth daily.    aspirin EC 81 MG tablet Take 81 mg by mouth daily.   B-D 3CC LUER-LOK SYR 21GX1" 21G X 1" 3 ML MISC USE EVERY  7 DAYS WITH  TESTOSTERONE INJECTION   bisoprolol-hydrochlorothiazide (ZIAC) 5-6.25 MG tablet TAKE 1 TABLET BY MOUTH EVERY MORNING FOR BLOOD PRESSURE   Cholecalciferol (VITAMIN D PO) Take 5,000 Units by mouth 2 (two) times daily.   ENBREL SURECLICK 50 MG/ML injection    fluocinonide ointment (LIDEX) 0.05 %    losartan (COZAAR) 50 MG tablet TAKE 1 TABLET AT NIGHT FOR BLOOD PRESSURE   Magnesium 500 MG TABS Take 1 tablet by mouth daily.   montelukast (SINGULAIR) 10 MG tablet TAKE 1 TABLET DAILY FOR    ALLERGIES AND ASTHMA   Multiple Vitamin (MULTIVITAMIN) tablet Take 1 tablet by mouth daily.   Omega-3 Fatty Acids (FISH OIL) 1200 MG CAPS Take by mouth 2 (two) times daily.   phentermine (ADIPEX-P) 37.5 MG tablet Take  1/2 to 1 tablet  every Morning for Dieting & Weight Loss / Patient knows to take by mouth   rosuvastatin (CRESTOR) 20 MG tablet  Take 1 tablet (20 mg total) by mouth daily. for cholesterol   testosterone cypionate (DEPOTESTOSTERONE CYPIONATE) 200 MG/ML injection INJECT 1 ML INTRAMUSCULARLY EVERY 7 DAYS. DISCARD THE REMAINDER.   topiramate (TOPAMAX) 50 MG tablet Take    1 tablet      2 x /day     at Suppertime & Bedtime for  Dieting & Weight Loss   vitamin B-12 (CYANOCOBALAMIN) 1000 MCG tablet Take 1,000 mcg by mouth daily.   vitamin C (ASCORBIC ACID) 500 MG tablet Take 500 mg by mouth 2 (two) times daily.   Zinc 50 MG TABS Take 1 tablet by mouth daily.   No current facility-administered medications on file prior to visit.     Allergies:  Allergies  Allergen Reactions   Lisinopril Cough     Medical History:  Past Medical History:  Diagnosis Date   Arthritis    Reiters Polyarthritis   Cholelithiases 02/04/2018   Per Abd [place] 01/2018 with fatty liver, no inflammation or obstruction  Elevated hemoglobin A1c measurement    Gout    Hyperlipidemia    Hypertension    Rheumatoid arthritis ([place])    Family history- Reviewed and unchanged Social history- Reviewed and unchanged   Review of Systems:  Review of Systems  Constitutional:  Negative for malaise/fatigue and weight loss.  HENT:  Negative for hearing loss and tinnitus.   Eyes:  Negative for blurred vision and double vision.  Respiratory:  Negative for cough, shortness of breath and wheezing.   Cardiovascular:  Negative for chest pain, palpitations, orthopnea, claudication and leg swelling.  Gastrointestinal:  Negative for abdominal pain, blood in stool, constipation, diarrhea, heartburn, melena, nausea and vomiting.  Genitourinary: Negative.   Musculoskeletal:  Negative for joint pain and myalgias.  Skin:  Negative for rash.  Neurological:  Negative for dizziness, tingling, sensory change, weakness and headaches.  Endo/Heme/Allergies:  Negative for polydipsia.  Psychiatric/Behavioral: Negative.    All other systems reviewed and are negative.  Physical  Exam: BP (!) 144/98   Pulse 89   Temp (!) 97.5 F (36.4 C)   Wt 260 lb (117.9 kg)   SpO2 99%   BMI 36.26 kg/m  Wt Readings from Last 3 Encounters:  11/02/20 260 lb (117.9 kg)  04/19/20 260 lb (117.9 kg)  04/01/20 258 lb 12.8 oz (117.4 kg)   General Appearance: Well nourished, obese male in no apparent distress. Eyes: PERRLA, EOMs, conjunctiva no swelling or erythema Sinuses: No Frontal/maxillary tenderness ENT/Mouth: Ext aud canals clear, TMs without erythema, bulging. No erythema, swelling, or exudate on post pharynx.  Tonsils not swollen or erythematous. Hearing normal.  Neck: Supple, thyroid normal.  Respiratory: Respiratory effort normal, BS equal bilaterally without rales, rhonchi, wheezing or stridor.  Cardio: RRR with no MRGs. Brisk peripheral pulses without edema.  Abdomen: Soft, obese abodmen + BS.  Non tender, no guarding, rebound, hernias, masses. Lymphatics: Non tender without lymphadenopathy.  Musculoskeletal: Full ROM, 5/5 strength, Normal gait Skin: Warm, dry without rashes, lesions, ecchymosis.  Neuro: Cranial nerves intact. No cerebellar symptoms.  Psych: Awake and oriented X 3, normal affect, Insight and Judgment appropriate.    Izora Ribas, NP 10:21 AM [facility]

## 2020-11-02 ENCOUNTER — Encounter: Payer: Self-pay | Admitting: Adult Health

## 2020-11-02 ENCOUNTER — Ambulatory Visit (INDEPENDENT_AMBULATORY_CARE_PROVIDER_SITE_OTHER): Payer: BC Managed Care – PPO | Admitting: Adult Health

## 2020-11-02 ENCOUNTER — Other Ambulatory Visit: Payer: Self-pay

## 2020-11-02 VITALS — BP 144/98 | HR 89 | Temp 97.5°F | Wt 260.0 lb

## 2020-11-02 DIAGNOSIS — Z79899 Other long term (current) drug therapy: Secondary | ICD-10-CM | POA: Diagnosis not present

## 2020-11-02 DIAGNOSIS — R7309 Other abnormal glucose: Secondary | ICD-10-CM

## 2020-11-02 DIAGNOSIS — M069 Rheumatoid arthritis, unspecified: Secondary | ICD-10-CM | POA: Diagnosis not present

## 2020-11-02 DIAGNOSIS — E782 Mixed hyperlipidemia: Secondary | ICD-10-CM

## 2020-11-02 DIAGNOSIS — I10 Essential (primary) hypertension: Secondary | ICD-10-CM | POA: Diagnosis not present

## 2020-11-02 DIAGNOSIS — M1000 Idiopathic gout, unspecified site: Secondary | ICD-10-CM

## 2020-11-02 DIAGNOSIS — E559 Vitamin D deficiency, unspecified: Secondary | ICD-10-CM

## 2020-11-02 DIAGNOSIS — E291 Testicular hypofunction: Secondary | ICD-10-CM | POA: Diagnosis not present

## 2020-11-02 DIAGNOSIS — Z1211 Encounter for screening for malignant neoplasm of colon: Secondary | ICD-10-CM

## 2020-11-02 NOTE — Patient Instructions (Addendum)
Goals      Blood Pressure < 130/80     Exercise 150 min/wk Moderate Activity     Weight (lb) < 245 lb (111.1 kg)       HYPERTENSION INFORMATION  Monitor your blood pressure at home, please keep a record and bring that in with you to your next office visit.   Go to the ER if any CP, SOB, nausea, dizziness, severe HA, changes vision/speech  Testing/Procedures: HOW TO TAKE YOUR BLOOD PRESSURE: Rest 5 minutes before taking your blood pressure. Don't smoke or drink caffeinated beverages for at least 30 minutes before. Take your blood pressure before (not after) you eat. Sit comfortably with your back supported and both feet on the floor (don't cross your legs). Elevate your arm to heart level on a table or a desk. Use the proper sized cuff. It should fit smoothly and snugly around your bare upper arm. There should be enough room to slip a fingertip under the cuff. The bottom edge of the cuff should be 1 inch above the crease of the elbow.  Due to a recent study, SPRINT, we have changed our goal for the systolic or top blood pressure number. Ideally we want your top number at 120.   In the [facility] Trial, 5000 people were randomized to a goal BP of 120 and 5000 people were randomized to a goal BP of less than 140. The patients with the goal BP at 120 had LESS DEMENTIA, LESS HEART ATTACKS, AND LESS STROKES, AS WELL AS OVERALL DECREASED MORTALITY OR DEATH RATE.   There was another study that showed taking your blood pressure medications at night decrease cardiovascular events.  However if you are on a fluid pill, please take this in the morning.   If you are willing, our goal BP is the top number of 120.  Your most recent BP: BP: (!) 144/98   Take your medications faithfully as instructed. Maintain a healthy weight. Get at least 150 minutes of aerobic exercise per week. Minimize salt intake. Minimize alcohol intake  DASH Eating Plan DASH stands for "Dietary Approaches to Stop  Hypertension." The DASH eating plan is a healthy eating plan that has been shown to reduce high blood pressure (hypertension). Additional health benefits may include reducing the risk of type 2 diabetes mellitus, heart disease, and stroke. The DASH eating plan may also help with weight loss. WHAT DO I NEED TO KNOW ABOUT THE DASH EATING PLAN? For the DASH eating plan, you will follow these general guidelines: Choose foods with a percent daily value for sodium of less than 5% (as listed on the food label). Use salt-free seasonings or herbs instead of table salt or sea salt. Check with your health care provider or pharmacist before using salt substitutes. Eat lower-sodium products, often labeled as "lower sodium" or "no salt added." Eat fresh foods. Eat more vegetables, fruits, and low-fat dairy products. Choose whole grains. Look for the word "whole" as the first word in the ingredient list. Choose fish and skinless chicken or [place] more often than red meat. Limit fish, poultry, and meat to 6 oz (170 g) each day. Limit sweets, desserts, sugars, and sugary drinks. Choose heart-healthy fats. Limit cheese to 1 oz (28 g) per day. Eat more home-cooked food and less restaurant, buffet, and fast food. Limit fried foods. Cook foods using methods other than frying. Limit canned vegetables. If you do use them, rinse them well to decrease the sodium. When eating at a restaurant, ask  that your food be prepared with less salt, or no salt if possible. WHAT FOODS CAN I EAT? Seek help from a dietitian for individual calorie needs. Grains Whole grain or whole wheat bread. Brown rice. Whole grain or whole wheat pasta. Quinoa, bulgur, and whole grain cereals. Low-sodium cereals. Corn or whole wheat flour tortillas. Whole grain cornbread. Whole grain crackers. Low-sodium crackers. Vegetables Fresh or frozen vegetables (raw, steamed, roasted, or grilled). Low-sodium or reduced-sodium tomato and vegetable juices.  Low-sodium or reduced-sodium tomato sauce and paste. Low-sodium or reduced-sodium canned vegetables.  Fruits All fresh, canned (in natural juice), or frozen fruits. Meat and Other Protein Products Ground beef (85% or leaner), grass-fed beef, or beef trimmed of fat. Skinless chicken or [place]. Ground chicken or [place]. Pork trimmed of fat. All fish and seafood. Eggs. Dried beans, peas, or lentils. Unsalted nuts and seeds. Unsalted canned beans. Dairy Low-fat dairy products, such as skim or 1% milk, 2% or reduced-fat cheeses, low-fat ricotta or cottage cheese, or plain low-fat yogurt. Low-sodium or reduced-sodium cheeses. Fats and Oils Tub margarines without trans fats. Light or reduced-fat mayonnaise and salad dressings (reduced sodium). Avocado. Safflower, olive, or canola oils. Natural peanut or almond butter. Other Unsalted popcorn and pretzels. The items listed above may not be a complete list of recommended foods or beverages. Contact your dietitian for more options. WHAT FOODS ARE NOT RECOMMENDED? Grains White bread. White pasta. White rice. Refined cornbread. Bagels and croissants. Crackers that contain trans fat. Vegetables Creamed or fried vegetables. Vegetables in a cheese sauce. Regular canned vegetables. Regular canned tomato sauce and paste. Regular tomato and vegetable juices. Fruits Dried fruits. Canned fruit in light or heavy syrup. Fruit juice. Meat and Other Protein Products Fatty cuts of meat. Ribs, chicken wings, bacon, sausage, bologna, salami, chitterlings, fatback, hot dogs, bratwurst, and packaged luncheon meats. Salted nuts and seeds. Canned beans with salt. Dairy Whole or 2% milk, cream, half-and-half, and cream cheese. Whole-fat or sweetened yogurt. Full-fat cheeses or blue cheese. Nondairy creamers and whipped toppings. Processed cheese, cheese spreads, or cheese curds. Condiments Onion and garlic salt, seasoned salt, table salt, and sea salt. Canned and packaged  gravies. Worcestershire sauce. Tartar sauce. Barbecue sauce. Teriyaki sauce. Soy sauce, including reduced sodium. Steak sauce. Fish sauce. Oyster sauce. Cocktail sauce. Horseradish. Ketchup and mustard. Meat flavorings and tenderizers. Bouillon cubes. Hot sauce. Tabasco sauce. Marinades. Taco seasonings. Relishes. Fats and Oils Butter, stick margarine, lard, shortening, ghee, and bacon fat. Coconut, palm kernel, or palm oils. Regular salad dressings. Other Pickles and olives. Salted popcorn and pretzels. The items listed above may not be a complete list of foods and beverages to avoid. Contact your dietitian for more information. WHERE CAN I FIND MORE INFORMATION? National Heart, Lung, and Blood Institute: travelstabloid.com Document Released: 12/22/2010 Document Revised: 05/19/2013 Document Reviewed: 11/06/2012 [facility] Patient Information 2015 [place], [place]. This information is not intended to replace advice given to you by your health care provider. Make sure you discuss any questions you have with your health care provider.     High-Fiber Eating Plan Fiber, also called dietary fiber, is a type of carbohydrate. It is found foods such as fruits, vegetables, whole grains, and beans. A high-fiber diet can have many health benefits. Your health care provider may recommend a high-fiber diet to help: Prevent constipation. Fiber can make your bowel movements more regular. Lower your cholesterol. Relieve the following conditions: Inflammation of veins in the anus (hemorrhoids). Inflammation of specific areas of the digestive tract (uncomplicated diverticulosis).  A problem of the large intestine, also called the colon, that sometimes causes pain and diarrhea (irritable bowel syndrome, or IBS). Prevent overeating as part of a weight-loss plan. Prevent heart disease, type 2 diabetes, and certain cancers. What are tips for following this plan? Reading food  labels  Check the nutrition facts label on food products for the amount of dietary fiber. Choose foods that have 5 grams of fiber or more per serving. The goals for recommended daily fiber intake include: Men (age 55 or younger): 34-38 g. Men (over age 17): 28-34 g. Women (age 18 or younger): 25-28 g. Women (over age 42): 22-25 g. Your daily fiber goal is _____________ g. Shopping Choose whole fruits and vegetables instead of processed forms, such as apple juice or applesauce. Choose a wide variety of high-fiber foods such as avocados, lentils, oats, and kidney beans. Read the nutrition facts label of the foods you choose. Be aware of foods with added fiber. These foods often have high sugar and sodium amounts per serving. Cooking Use whole-grain flour for baking and cooking. Cook with brown rice instead of white rice. Meal planning Start the day with a breakfast that is high in fiber, such as a cereal that contains 5 g of fiber or more per serving. Eat breads and cereals that are made with whole-grain flour instead of refined flour or white flour. Eat brown rice, bulgur wheat, or millet instead of white rice. Use beans in place of meat in soups, salads, and pasta dishes. Be sure that half of the grains you eat each day are whole grains. General information You can get the recommended daily intake of dietary fiber by: Eating a variety of fruits, vegetables, grains, nuts, and beans. Taking a fiber supplement if you are not able to take in enough fiber in your diet. It is better to get fiber through food than from a supplement. Gradually increase how much fiber you consume. If you increase your intake of dietary fiber too quickly, you may have bloating, cramping, or gas. Drink plenty of water to help you digest fiber. Choose high-fiber snacks, such as berries, raw vegetables, nuts, and popcorn. What foods should I eat? Fruits Berries. Pears. Apples. Oranges. Avocado. Prunes and raisins.  Dried figs. Vegetables Sweet potatoes. Spinach. Kale. Artichokes. Cabbage. Broccoli. Cauliflower. Green peas. Carrots. Squash. Grains Whole-grain breads. Multigrain cereal. Oats and oatmeal. Brown rice. Barley. Bulgur wheat. [place]. Quinoa. Bran muffins. Popcorn. Rye wafer crackers. Meats and other proteins Navy beans, kidney beans, and pinto beans. Soybeans. Split peas. Lentils. Nuts and seeds. Dairy Fiber-fortified yogurt. Beverages Fiber-fortified soy milk. Fiber-fortified orange juice. Other foods Fiber bars. The items listed above may not be a complete list of recommended foods and beverages. Contact a dietitian for more information. What foods should I avoid? Fruits Fruit juice. Cooked, strained fruit. Vegetables Fried potatoes. Canned vegetables. Well-cooked vegetables. Grains White bread. Pasta made with refined flour. White rice. Meats and other proteins Fatty cuts of meat. Fried chicken or fried fish. Dairy Milk. Yogurt. Cream cheese. Sour cream. Fats and oils Butters. Beverages Soft drinks. Other foods Cakes and pastries. The items listed above may not be a complete list of foods and beverages to avoid. Talk with your dietitian about what choices are best for you. Summary Fiber is a type of carbohydrate. It is found in foods such as fruits, vegetables, whole grains, and beans. A high-fiber diet has many benefits. It can help to prevent constipation, lower blood cholesterol, aid weight loss, and reduce  your risk of heart disease, diabetes, and certain cancers. Increase your intake of fiber gradually. Increasing fiber too quickly may cause cramping, bloating, and gas. Drink plenty of water while you increase the amount of fiber you consume. The best sources of fiber include whole fruits and vegetables, whole grains, nuts, seeds, and beans. This information is not intended to replace advice given to you by your health care provider. Make sure you discuss any questions you  have with your health care provider. Document Revised: 05/08/2019 Document Reviewed: 05/08/2019 Elsevier Patient Education  2022 [employer].

## 2020-11-03 ENCOUNTER — Other Ambulatory Visit: Payer: Self-pay | Admitting: Adult Health

## 2020-11-03 DIAGNOSIS — N289 Disorder of kidney and ureter, unspecified: Secondary | ICD-10-CM | POA: Insufficient documentation

## 2020-11-04 LAB — CBC WITH DIFFERENTIAL/PLATELET
Absolute Monocytes: 1580 cells/uL — ABNORMAL HIGH (ref 200–950)
Basophils Absolute: 105 cells/uL (ref 0–200)
Basophils Relative: 0.9 %
Eosinophils Absolute: 550 cells/uL — ABNORMAL HIGH (ref 15–500)
Eosinophils Relative: 4.7 %
HCT: 48.1 % (ref 38.5–50.0)
Hemoglobin: 16.4 g/dL (ref 13.2–17.1)
Lymphs Abs: 2668 cells/uL (ref 850–3900)
MCH: 31.8 pg (ref 27.0–33.0)
MCHC: 34.1 g/dL (ref 32.0–36.0)
MCV: 93.2 fL (ref 80.0–100.0)
MPV: 10.7 fL (ref 7.5–12.5)
Monocytes Relative: 13.5 %
Neutro Abs: 6798 cells/uL (ref 1500–7800)
Neutrophils Relative %: 58.1 %
Platelets: 268 10*3/uL (ref 140–400)
RBC: 5.16 10*6/uL (ref 4.20–5.80)
RDW: 13.5 % (ref 11.0–15.0)
Total Lymphocyte: 22.8 %
WBC: 11.7 10*3/uL — ABNORMAL HIGH (ref 3.8–10.8)

## 2020-11-04 LAB — COMPLETE METABOLIC PANEL WITH GFR
AG Ratio: 1.4 (calc) (ref 1.0–2.5)
ALT: 52 U/L — ABNORMAL HIGH (ref 9–46)
AST: 34 U/L (ref 10–35)
Albumin: 4.3 g/dL (ref 3.6–5.1)
Alkaline phosphatase (APISO): 76 U/L (ref 35–144)
BUN/Creatinine Ratio: 13 (calc) (ref 6–22)
BUN: 20 mg/dL (ref 7–25)
CO2: 26 mmol/L (ref 20–32)
Calcium: 9.7 mg/dL (ref 8.6–10.3)
Chloride: 103 mmol/L (ref 98–110)
Creat: 1.54 mg/dL — ABNORMAL HIGH (ref 0.70–1.30)
Globulin: 3 g/dL (calc) (ref 1.9–3.7)
Glucose, Bld: 61 mg/dL — ABNORMAL LOW (ref 65–99)
Potassium: 4.7 mmol/L (ref 3.5–5.3)
Sodium: 138 mmol/L (ref 135–146)
Total Bilirubin: 1 mg/dL (ref 0.2–1.2)
Total Protein: 7.3 g/dL (ref 6.1–8.1)
eGFR: 52 mL/min/{1.73_m2} — ABNORMAL LOW (ref >=60)

## 2020-11-04 LAB — LIPID PANEL
Cholesterol: 135 mg/dL (ref <200)
HDL: 45 mg/dL (ref >=40)
LDL Cholesterol (Calc): 70 mg/dL (calc)
Non-HDL Cholesterol (Calc): 90 mg/dL (calc) (ref <130)
Total CHOL/HDL Ratio: 3 (calc) (ref <5.0)
Triglycerides: 123 mg/dL (ref <150)

## 2020-11-04 LAB — TSH: TSH: 2.51 mIU/L (ref 0.40–4.50)

## 2020-11-04 LAB — MAGNESIUM: Magnesium: 2.3 mg/dL (ref 1.5–2.5)

## 2020-11-04 LAB — TEST AUTHORIZATION

## 2020-11-04 LAB — PATHOLOGIST SMEAR REVIEW

## 2020-11-05 ENCOUNTER — Other Ambulatory Visit: Payer: Self-pay | Admitting: Adult Health

## 2020-11-05 DIAGNOSIS — D72829 Elevated white blood cell count, unspecified: Secondary | ICD-10-CM

## 2020-11-20 DIAGNOSIS — Z1211 Encounter for screening for malignant neoplasm of colon: Secondary | ICD-10-CM | POA: Diagnosis not present

## 2020-11-25 LAB — COLOGUARD: COLOGUARD: NEGATIVE

## 2020-11-30 ENCOUNTER — Other Ambulatory Visit: Payer: Self-pay

## 2020-11-30 ENCOUNTER — Ambulatory Visit (INDEPENDENT_AMBULATORY_CARE_PROVIDER_SITE_OTHER): Payer: BC Managed Care – PPO

## 2020-11-30 DIAGNOSIS — N289 Disorder of kidney and ureter, unspecified: Secondary | ICD-10-CM | POA: Diagnosis not present

## 2020-11-30 DIAGNOSIS — I10 Essential (primary) hypertension: Secondary | ICD-10-CM

## 2020-11-30 DIAGNOSIS — D72829 Elevated white blood cell count, unspecified: Secondary | ICD-10-CM

## 2020-11-30 NOTE — Progress Notes (Signed)
The patient came in today for recheck of blood pressure and labs. He reports his wife has been taking his blood pressure at home and that his values are normal. However, patient does not have blood pressure log nor does he know his numbers. He reports that he is a bad historian and does not retain names nor numbers.

## 2020-12-01 ENCOUNTER — Encounter: Payer: Self-pay | Admitting: Adult Health

## 2020-12-01 DIAGNOSIS — N183 Chronic kidney disease, stage 3 unspecified: Secondary | ICD-10-CM | POA: Insufficient documentation

## 2020-12-01 LAB — BASIC METABOLIC PANEL WITH GFR
BUN/Creatinine Ratio: 13 (calc) (ref 6–22)
BUN: 18 mg/dL (ref 7–25)
CO2: 28 mmol/L (ref 20–32)
Calcium: 9.5 mg/dL (ref 8.6–10.3)
Chloride: 103 mmol/L (ref 98–110)
Creat: 1.4 mg/dL — ABNORMAL HIGH (ref 0.70–1.35)
Glucose, Bld: 80 mg/dL (ref 65–99)
Potassium: 4.4 mmol/L (ref 3.5–5.3)
Sodium: 138 mmol/L (ref 135–146)
eGFR: 58 mL/min/{1.73_m2} — ABNORMAL LOW (ref >=60)

## 2020-12-01 LAB — URINALYSIS W MICROSCOPIC + REFLEX CULTURE
Bacteria, UA: NONE SEEN /HPF
Bilirubin Urine: NEGATIVE
Glucose, UA: NEGATIVE
Hgb urine dipstick: NEGATIVE
Hyaline Cast: NONE SEEN /LPF
Ketones, ur: NEGATIVE
Leukocyte Esterase: NEGATIVE
Nitrites, Initial: NEGATIVE
Protein, ur: NEGATIVE
RBC / HPF: NONE SEEN /HPF (ref 0–2)
Specific Gravity, Urine: 1.01 (ref 1.001–1.035)
Squamous Epithelial / HPF: NONE SEEN /HPF (ref <=5)
WBC, UA: NONE SEEN /HPF (ref 0–5)
pH: 7.5 (ref 5.0–8.0)

## 2020-12-01 LAB — NO CULTURE INDICATED

## 2020-12-04 LAB — LEUKEMIA/LYMPHOMA EVALUATION PANEL
NUMBER OF MARKERS:: 22
VIABILITY:: 96 %

## 2020-12-12 ENCOUNTER — Other Ambulatory Visit: Payer: Self-pay | Admitting: Internal Medicine

## 2020-12-12 ENCOUNTER — Other Ambulatory Visit: Payer: Self-pay | Admitting: Adult Health

## 2020-12-12 DIAGNOSIS — E349 Endocrine disorder, unspecified: Secondary | ICD-10-CM

## 2020-12-12 DIAGNOSIS — I10 Essential (primary) hypertension: Secondary | ICD-10-CM

## 2020-12-30 DIAGNOSIS — H40013 Open angle with borderline findings, low risk, bilateral: Secondary | ICD-10-CM | POA: Diagnosis not present

## 2020-12-30 DIAGNOSIS — I10 Essential (primary) hypertension: Secondary | ICD-10-CM | POA: Diagnosis not present

## 2020-12-30 DIAGNOSIS — Z961 Presence of intraocular lens: Secondary | ICD-10-CM | POA: Diagnosis not present

## 2021-01-04 DIAGNOSIS — B351 Tinea unguium: Secondary | ICD-10-CM | POA: Diagnosis not present

## 2021-01-04 DIAGNOSIS — L308 Other specified dermatitis: Secondary | ICD-10-CM | POA: Diagnosis not present

## 2021-02-01 ENCOUNTER — Other Ambulatory Visit: Payer: Self-pay | Admitting: Internal Medicine

## 2021-03-21 DIAGNOSIS — M109 Gout, unspecified: Secondary | ICD-10-CM | POA: Diagnosis not present

## 2021-03-21 DIAGNOSIS — N289 Disorder of kidney and ureter, unspecified: Secondary | ICD-10-CM | POA: Diagnosis not present

## 2021-03-21 DIAGNOSIS — Z79899 Other long term (current) drug therapy: Secondary | ICD-10-CM | POA: Diagnosis not present

## 2021-03-21 DIAGNOSIS — M069 Rheumatoid arthritis, unspecified: Secondary | ICD-10-CM | POA: Diagnosis not present

## 2021-03-23 ENCOUNTER — Encounter: Payer: BC Managed Care – PPO | Admitting: Adult Health

## 2021-03-31 NOTE — Progress Notes (Signed)
Complete Physical ? ?Assessment and Plan: ? ?Encounter for general adult medical examination with abnormal findings ?1 year ? ?Essential hypertension ?- continue medications, DASH diet, exercise and monitor at home. Call if greater than 130/80.  ?-     CBC with Differential/Platelet ?-     COMPLETE METABOLIC PANEL WITH GFR ?-     TSH ?-     Microalbumin / creatinine urine ratio ?-     Urinalysis, Routine w reflex microscopic ?-     EKG 12-Lead ? ?Hyperlipidemia, mixed ?-     Lipid panel ?check lipids ?decrease fatty foods ?increase activity.  ? ?Abnormal glucose ?-     Hemoglobin A1c ?Discussed disease progression and risks ?Discussed diet/exercise, weight management and risk modification ? ?Medication management ?-     Magnesium ? ?Vitamin D deficiency ?-     VITAMIN D 25 Hydroxy (Vit-D Deficiency, Fractures) ? ?Rheumatoid arthritis involving multiple sites, unspecified whether rheumatoid factor present ([place]) ?Continue follow up, doing well on enbrel, Dr. Dossie Der following, may switch to Cosentyx due to cost.  ?If ALT remains stable will send note to Dr. Dossie Der noting liver enzymes are stable, not concerning level ? ?Hypogonadism in male ?Continued perceived benefits with supplementation, no SE. ?Check level, continue zinc, no symptoms of elevated estrogen or dihydrotestosterone.  ?-     Testosterone ? ?Idiopathic gout, unspecified chronicity, unspecified site ?- recheck Uric acid as needed, Diet discussed, continue medications. ? ?Fatty liver ?Check labs, avoid tylenol, alcohol, weight loss advised.  ?Labs have stayed stable with mild elevation x 7 years ? ?Morbid obesity - BMI 35+ htn, hyperlipidemia ?- follow up 3 months for progress monitoring ?- increase veggies, decrease carbs ?- long discussion about weight loss, diet, and exercise ?- does not perceive benefit from phentermine/topamax, will d/c medications ?- 6 month follow up ? ?Arthritis ?Monitor ? ?Screening PSA (prostate specific antigen) ?-      PSA ? ?Screening for ischemic heart disease ?- EKG ? ?Screening for hematuria/proteinuria ?- Routine UA with reflex microscopic ?- Microalbumin/creatinine urine ratio ? ? ? ?No orders of the defined types were placed in this encounter. ? ? ?Discussed med's effects and SE's. Screening labs and tests as requested with regular follow-up as recommended. ?Over 40 minutes of exam, counseling, chart review and critical decision making was performed ?Future Appointments  ?Date Time Provider Dola  ?04/05/2022  9:00 AM Kailene Steinhart, Townsend Roger, NP [facility] None  ? ? ?HPI ?61 y.o. male patient presents for a complete physical. He has Hypertension; Abnormal glucose; Hyperlipidemia, mixed; Arthritis; Idiopathic gout; Medication management; Vitamin D deficiency; Morbid obesity ([place])- BMI 35+ with htn, hyperlipidemia; Fatty liver; Hypogonadism in male; Rheumatoid arthritis involving multiple sites [facility]); Cholelithiases; Deterioration in renal function; and CKD (chronic kidney disease) stage 3, GFR 30-59 ml/min (HCC) on their problem list. ? 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  ? ?He has been following with ophth for R eye cataract; has upcoming appointment in April to discuss with Dr. Tommy Rainwater.  ? ?He is followed by Dr. Dossie Der for RA since 2004 and is on enbrel with excellent control. The company has decreased amount they will cover for Enbrel so may be stopping. May be switching to Cosentyx. ? ?He has some redness noted of his hands x several years. Denies pain. ? ?He had [place] abd 01/2018 that showed mild non-obstructing gallstones and likely fatty liver changes.  ? ?Last testosterone injection was over 2 weeks ago.  Will check today. ? ?BMI is Body mass index is 34.67 kg/m?., he is working on diet and exercise. He has lost 19 lbs. He did a lot more yardwork in the fall, as well as in January and December. ? ?Wt Readings from Last 3 Encounters:  ?04/04/21 241 lb 9.6 oz (109.6 kg)  ?11/30/20  258 lb 6.4 oz (117.2 kg)  ?11/02/20 260 lb (117.9 kg)  ? ?His blood pressure is checked by his wife at home, states it is normal there, today their BP is BP: 138/84.  ?BP Readings from Last 3 Encounters:  ?04/04/21 138/84  ?11/30/20 (!) 160/90  ?11/02/20 (!) 144/98  ?  ?He does not workout. He denies chest pain, shortness of breath, dizziness.  ? ?He is on cholesterol medication, crestor 20 mg 1/2 M,W,F,Sun  and denies myalgias. His cholesterol is at goal. The cholesterol last visit was:   ?Lab Results  ?Component Value Date  ? CHOL 135 11/02/2020  ? HDL 45 11/02/2020  ? [place] 70 11/02/2020  ? TRIG 123 11/02/2020  ? CHOLHDL 3.0 11/02/2020  ? ?He has been working on diet and exercise for glucose management,  Last A1C in the office was:  ?Lab Results  ?Component Value Date  ? HGBA1C 5.4 04/01/2020  ? ?He reports does use some ibuprofenLast GFR: ?Lab Results  ?Component Value Date  ? GFRNONAA 57 (L) 04/01/2020  ? GFRNONAA 60 03/21/2019  ? GFRNONAA 69 11/05/2018  ? ?He has a history of testosterone deficiency and is on testosterone replacement. Taking 200 mg once weekly, last injection was over 1 weeks ago. He states that the testosterone helps with his energy, libido, muscle mass. Denies symptoms of elevated estrogen or dihydrotestosterone.  ?Lab Results  ?Component Value Date  ? TESTOSTERONE 198 (L) 04/01/2020  ? ?We are monitoring his LFTs for fatty liver and he did have a gallstone but no evidence of inflammation on [place]- 01/2018  ?Lab Results  ?Component Value Date  ? ALT 52 (H) 11/02/2020  ? AST 34 11/02/2020  ? ALKPHOS 69 05/22/2016  ? BILITOT 1.0 11/02/2020  ? ? Patient is on Vitamin D supplement.   ?Lab Results  ?Component Value Date  ? VD25OH 76 04/01/2020  ?   ?Denies nocturia, LUTs. On finasteride 5 mg   Last PSA was: ?Lab Results  ?Component Value Date  ? PSA 0.2 03/21/2019  ? ?Patient is on allopurinol for gout and does not report a recent flare.  ?Lab Results  ?Component Value Date  ? LABURIC 5.4  04/01/2020  ? ? ?Current Medications:  ?Current Outpatient Medications on File Prior to Visit  ?Medication Sig Dispense Refill  ? allopurinol (ZYLOPRIM) 300 MG tablet Take 300 mg by mouth daily.     ? aspirin EC 81 MG tablet Take 81 mg by mouth daily.    ? Cholecalciferol (VITAMIN D PO) Take 5,000 Units by mouth 2 (two) times daily.    ? ENBREL SURECLICK 50 MG/ML injection     ? fluocinonide ointment (LIDEX) 0.05 %     ? losartan (COZAAR) 50 MG tablet TAKE 1 TABLET AT NIGHT FOR BLOOD PRESSURE 90 tablet 3  ? Magnesium 500 MG TABS Take 1 tablet by mouth daily.    ? montelukast (SINGULAIR) 10 MG tablet TAKE 1 TABLET DAILY FOR    ALLERGIES AND ASTHMA 90 tablet 3  ? Multiple Vitamin (MULTIVITAMIN) tablet Take 1 tablet by mouth daily.    ? Omega-3 Fatty Acids (FISH OIL) 1200 MG CAPS Take  by mouth 2 (two) times daily.    ? phentermine (ADIPEX-P) 37.5 MG tablet TAKE 1/2 TO 1 TABLET EVERY MORNING FOR DIETING AND WEIGHT LOSS 90 tablet 0  ? rosuvastatin (CRESTOR) 20 MG tablet Take 1 tablet (20 mg total) by mouth daily. for cholesterol 36 tablet 3  ? testosterone cypionate (DEPOTESTOSTERONE CYPIONATE) 200 MG/ML injection INJECT 1 ML INTRAMUSCULARLY EVERY 7 DAYS. DISCARD THE REMAINDER. 12 mL 1  ? topiramate (TOPAMAX) 50 MG tablet Take    1 tablet      2 x /day     at Suppertime & Bedtime for  Dieting & Weight Loss 180 tablet 3  ? vitamin B-12 (CYANOCOBALAMIN) 1000 MCG tablet Take 1,000 mcg by mouth daily.    ? vitamin C (ASCORBIC ACID) 500 MG tablet Take 500 mg by mouth 2 (two) times daily.    ? Zinc 50 MG TABS Take 1 tablet by mouth daily.    ? B-D 3CC LUER-LOK SYR 21GX1" 21G X 1" 3 ML MISC USE EVERY  7 DAYS WITH  TESTOSTERONE INJECTION 50 each 1  ? bisoprolol-hydrochlorothiazide (ZIAC) 5-6.25 MG tablet TAKE 1 TABLET BY MOUTH EVERY MORNING FOR BLOOD PRESSURE 90 tablet 1  ? ?No current facility-administered medications on file prior to visit.  ? ?Allergies:  ?Allergies  ?Allergen Reactions  ? Lisinopril Cough  ? ?Health  Maintenance:  ?Immunization History  ?Administered Date(s) Administered  ? Moderna SARS-COV2 Booster Vaccination 10/11/2020  ? Moderna Sars-Covid-2 Vaccination 04/21/2019, 05/19/2019  ? PPD Test 06/23/2013, 10/05/2014, 10/04/201

## 2021-04-04 ENCOUNTER — Encounter: Payer: Self-pay | Admitting: Nurse Practitioner

## 2021-04-04 ENCOUNTER — Other Ambulatory Visit: Payer: Self-pay

## 2021-04-04 ENCOUNTER — Encounter: Payer: BC Managed Care – PPO | Admitting: Nurse Practitioner

## 2021-04-04 ENCOUNTER — Ambulatory Visit (INDEPENDENT_AMBULATORY_CARE_PROVIDER_SITE_OTHER): Payer: BC Managed Care – PPO | Admitting: Nurse Practitioner

## 2021-04-04 VITALS — BP 138/84 | HR 89 | Temp 97.9°F | Ht 70.0 in | Wt 241.6 lb

## 2021-04-04 DIAGNOSIS — Z1389 Encounter for screening for other disorder: Secondary | ICD-10-CM

## 2021-04-04 DIAGNOSIS — Z0000 Encounter for general adult medical examination without abnormal findings: Secondary | ICD-10-CM

## 2021-04-04 DIAGNOSIS — Z1329 Encounter for screening for other suspected endocrine disorder: Secondary | ICD-10-CM

## 2021-04-04 DIAGNOSIS — Z13220 Encounter for screening for lipoid disorders: Secondary | ICD-10-CM

## 2021-04-04 DIAGNOSIS — N401 Enlarged prostate with lower urinary tract symptoms: Secondary | ICD-10-CM | POA: Diagnosis not present

## 2021-04-04 DIAGNOSIS — R350 Frequency of micturition: Secondary | ICD-10-CM | POA: Diagnosis not present

## 2021-04-04 DIAGNOSIS — Z79899 Other long term (current) drug therapy: Secondary | ICD-10-CM

## 2021-04-04 DIAGNOSIS — Z131 Encounter for screening for diabetes mellitus: Secondary | ICD-10-CM | POA: Diagnosis not present

## 2021-04-04 DIAGNOSIS — E559 Vitamin D deficiency, unspecified: Secondary | ICD-10-CM | POA: Diagnosis not present

## 2021-04-04 DIAGNOSIS — Z125 Encounter for screening for malignant neoplasm of prostate: Secondary | ICD-10-CM

## 2021-04-05 LAB — COMPLETE METABOLIC PANEL WITH GFR
AG Ratio: 1.6 (calc) (ref 1.0–2.5)
ALT: 70 U/L — ABNORMAL HIGH (ref 9–46)
AST: 36 U/L — ABNORMAL HIGH (ref 10–35)
Albumin: 4.5 g/dL (ref 3.6–5.1)
Alkaline phosphatase (APISO): 89 U/L (ref 35–144)
BUN/Creatinine Ratio: 20 (calc) (ref 6–22)
BUN: 26 mg/dL — ABNORMAL HIGH (ref 7–25)
CO2: 28 mmol/L (ref 20–32)
Calcium: 9.6 mg/dL (ref 8.6–10.3)
Chloride: 104 mmol/L (ref 98–110)
Creat: 1.27 mg/dL (ref 0.70–1.35)
Globulin: 2.9 g/dL (calc) (ref 1.9–3.7)
Glucose, Bld: 76 mg/dL (ref 65–99)
Potassium: 4.7 mmol/L (ref 3.5–5.3)
Sodium: 139 mmol/L (ref 135–146)
Total Bilirubin: 1 mg/dL (ref 0.2–1.2)
Total Protein: 7.4 g/dL (ref 6.1–8.1)
eGFR: 65 mL/min/{1.73_m2} (ref >=60)

## 2021-04-05 LAB — HEMOGLOBIN A1C
Hgb A1c MFr Bld: 5.4 % of total Hgb (ref <5.7)
Mean Plasma Glucose: 108 mg/dL
eAG (mmol/L): 6 mmol/L

## 2021-04-05 LAB — CBC WITH DIFFERENTIAL/PLATELET
Absolute Monocytes: 1193 cells/uL — ABNORMAL HIGH (ref 200–950)
Basophils Absolute: 97 cells/uL (ref 0–200)
Basophils Relative: 1 %
Eosinophils Absolute: 407 cells/uL (ref 15–500)
Eosinophils Relative: 4.2 %
HCT: 49.5 % (ref 38.5–50.0)
Hemoglobin: 16.7 g/dL (ref 13.2–17.1)
Lymphs Abs: 1824 cells/uL (ref 850–3900)
MCH: 31.9 pg (ref 27.0–33.0)
MCHC: 33.7 g/dL (ref 32.0–36.0)
MCV: 94.6 fL (ref 80.0–100.0)
MPV: 10.4 fL (ref 7.5–12.5)
Monocytes Relative: 12.3 %
Neutro Abs: 6179 cells/uL (ref 1500–7800)
Neutrophils Relative %: 63.7 %
Platelets: 262 10*3/uL (ref 140–400)
RBC: 5.23 10*6/uL (ref 4.20–5.80)
RDW: 13.9 % (ref 11.0–15.0)
Total Lymphocyte: 18.8 %
WBC: 9.7 10*3/uL (ref 3.8–10.8)

## 2021-04-05 LAB — URINALYSIS, ROUTINE W REFLEX MICROSCOPIC
Bilirubin Urine: NEGATIVE
Glucose, UA: NEGATIVE
Hgb urine dipstick: NEGATIVE
Ketones, ur: NEGATIVE
Leukocytes,Ua: NEGATIVE
Nitrite: NEGATIVE
Protein, ur: NEGATIVE
Specific Gravity, Urine: 1.011 (ref 1.001–1.035)
pH: 7 (ref 5.0–8.0)

## 2021-04-05 LAB — LIPID PANEL
Cholesterol: 140 mg/dL (ref <200)
HDL: 47 mg/dL (ref >=40)
LDL Cholesterol (Calc): 72 mg/dL (calc)
Non-HDL Cholesterol (Calc): 93 mg/dL (calc) (ref <130)
Total CHOL/HDL Ratio: 3 (calc) (ref <5.0)
Triglycerides: 129 mg/dL (ref <150)

## 2021-04-05 LAB — VITAMIN D 25 HYDROXY (VIT D DEFICIENCY, FRACTURES): Vit D, 25-Hydroxy: 95 ng/mL (ref 30–100)

## 2021-04-05 LAB — MICROALBUMIN / CREATININE URINE RATIO
Creatinine, Urine: 52 mg/dL (ref 20–320)
Microalb, Ur: <0.2 mg/dL

## 2021-04-05 LAB — TESTOSTERONE: Testosterone: 187 ng/dL — ABNORMAL LOW (ref 250–827)

## 2021-04-05 LAB — TSH: TSH: 1.83 mIU/L (ref 0.40–4.50)

## 2021-04-05 LAB — MAGNESIUM: Magnesium: 2.3 mg/dL (ref 1.5–2.5)

## 2021-04-05 LAB — PSA: PSA: 0.32 ng/mL (ref <=4.00)

## 2021-05-02 ENCOUNTER — Other Ambulatory Visit: Payer: Self-pay | Admitting: Nurse Practitioner

## 2021-05-02 ENCOUNTER — Other Ambulatory Visit: Payer: Self-pay | Admitting: Internal Medicine

## 2021-05-02 DIAGNOSIS — E782 Mixed hyperlipidemia: Secondary | ICD-10-CM

## 2021-05-02 MED ORDER — ROSUVASTATIN CALCIUM 20 MG PO TABS: 20.0000 mg | ORAL_TABLET | Freq: Every day | ORAL | 3 refills | Status: DC

## 2021-05-04 ENCOUNTER — Telehealth: Payer: Self-pay | Admitting: Nurse Practitioner

## 2021-05-04 ENCOUNTER — Other Ambulatory Visit: Payer: Self-pay

## 2021-05-04 DIAGNOSIS — E782 Mixed hyperlipidemia: Secondary | ICD-10-CM

## 2021-05-04 MED ORDER — ROSUVASTATIN CALCIUM 20 MG PO TABS: 20.0000 mg | ORAL_TABLET | Freq: Every day | ORAL | 3 refills | Status: DC

## 2021-05-04 NOTE — Telephone Encounter (Signed)
Rosuvastatin prescription says "no print" and wasn't received electronically from mail order pharmacy  ?

## 2021-06-13 ENCOUNTER — Other Ambulatory Visit: Payer: Self-pay | Admitting: Nurse Practitioner

## 2021-06-13 DIAGNOSIS — E349 Endocrine disorder, unspecified: Secondary | ICD-10-CM

## 2021-06-14 ENCOUNTER — Telehealth: Payer: Self-pay | Admitting: Nurse Practitioner

## 2021-06-14 ENCOUNTER — Other Ambulatory Visit: Payer: Self-pay | Admitting: Nurse Practitioner

## 2021-06-14 DIAGNOSIS — E291 Testicular hypofunction: Secondary | ICD-10-CM

## 2021-06-14 MED ORDER — "BD LUER-LOK SYRINGE 21G X 1"" 3 ML MISC": 1 refills | Status: AC

## 2021-06-14 NOTE — Telephone Encounter (Signed)
Needing refill on syringes as well as the Test Inj: B-D 3CC LUER-LOK SYR 21GX1" 21G X 1" 3 ML MISC   --  01/20/20  --  Unk Pinto, MD   USE EVERY  7 DAYS WITH  TESTOSTERONE INJECTION

## 2021-07-13 DIAGNOSIS — G5700 Lesion of sciatic nerve, unspecified lower limb: Secondary | ICD-10-CM | POA: Diagnosis not present

## 2021-07-13 DIAGNOSIS — M109 Gout, unspecified: Secondary | ICD-10-CM | POA: Diagnosis not present

## 2021-07-13 DIAGNOSIS — Z79899 Other long term (current) drug therapy: Secondary | ICD-10-CM | POA: Diagnosis not present

## 2021-07-13 DIAGNOSIS — M5430 Sciatica, unspecified side: Secondary | ICD-10-CM | POA: Diagnosis not present

## 2021-07-13 DIAGNOSIS — M25561 Pain in right knee: Secondary | ICD-10-CM | POA: Diagnosis not present

## 2021-07-13 DIAGNOSIS — M25552 Pain in left hip: Secondary | ICD-10-CM | POA: Diagnosis not present

## 2021-07-13 DIAGNOSIS — M25562 Pain in left knee: Secondary | ICD-10-CM | POA: Diagnosis not present

## 2021-07-13 DIAGNOSIS — M25551 Pain in right hip: Secondary | ICD-10-CM | POA: Diagnosis not present

## 2021-07-27 NOTE — Progress Notes (Unsigned)
    Subjective:    CC: Piriformis syndrome  I, Molly Weber, LAT, ATC, am serving as scribe for Dr. Lynne Leader.  HPI: Pt is a 61 y/o male presenting w/ c/o hip/buttock pain x .  He locates his pain to .  Radiating pain: Aggravating factors: Treatments tried:   Diagnostic testing: L-spine MRI-10/20/03  Pertinent review of Systems: ***  Relevant historical information: ***   Objective:   There were no vitals filed for this visit. General: Well Developed, well nourished, and in no acute distress.   MSK: ***  Lab and Radiology Results No results found for this or any previous visit (from the past 72 hour(s)). No results found.    Impression and Recommendations:    Assessment and Plan: 61 y.o. male with ***.  PDMP not reviewed this encounter. No orders of the defined types were placed in this encounter.  No orders of the defined types were placed in this encounter.   Discussed warning signs or symptoms. Please see discharge instructions. Patient expresses understanding.   ***

## 2021-07-28 ENCOUNTER — Ambulatory Visit (INDEPENDENT_AMBULATORY_CARE_PROVIDER_SITE_OTHER): Payer: BC Managed Care – PPO | Admitting: Family Medicine

## 2021-07-28 ENCOUNTER — Encounter: Payer: Self-pay | Admitting: Family Medicine

## 2021-07-28 VITALS — BP 120/80 | HR 81 | Ht 70.0 in | Wt 244.2 lb

## 2021-07-28 DIAGNOSIS — G5701 Lesion of sciatic nerve, right lower limb: Secondary | ICD-10-CM

## 2021-07-28 MED ORDER — PREDNISONE 50 MG PO TABS: 50.0000 mg | ORAL_TABLET | Freq: Every day | ORAL | 0 refills | Status: AC

## 2021-07-28 NOTE — Patient Instructions (Addendum)
Thank you for coming in today.   Prednisone sent to your pharmacy.  Please perform the exercise program that we have prepared for you and gone over in detail on a daily basis.  In addition to the handout you were provided you can access your program through: [email or web address]   Your unique program code is:   [email or web address]  Follow-up: as needed

## 2021-08-02 ENCOUNTER — Encounter: Payer: Self-pay | Admitting: Internal Medicine

## 2021-08-08 ENCOUNTER — Encounter: Payer: Self-pay | Admitting: Nurse Practitioner

## 2021-08-08 ENCOUNTER — Ambulatory Visit (INDEPENDENT_AMBULATORY_CARE_PROVIDER_SITE_OTHER): Payer: BC Managed Care – PPO | Admitting: Nurse Practitioner

## 2021-08-08 VITALS — BP 122/68 | HR 71 | Temp 97.5°F | Ht 70.0 in | Wt 245.2 lb

## 2021-08-08 DIAGNOSIS — E782 Mixed hyperlipidemia: Secondary | ICD-10-CM

## 2021-08-08 DIAGNOSIS — Z79899 Other long term (current) drug therapy: Secondary | ICD-10-CM | POA: Diagnosis not present

## 2021-08-08 DIAGNOSIS — M069 Rheumatoid arthritis, unspecified: Secondary | ICD-10-CM

## 2021-08-08 DIAGNOSIS — R7309 Other abnormal glucose: Secondary | ICD-10-CM | POA: Diagnosis not present

## 2021-08-08 DIAGNOSIS — I10 Essential (primary) hypertension: Secondary | ICD-10-CM | POA: Diagnosis not present

## 2021-08-08 DIAGNOSIS — M1000 Idiopathic gout, unspecified site: Secondary | ICD-10-CM

## 2021-08-08 DIAGNOSIS — E559 Vitamin D deficiency, unspecified: Secondary | ICD-10-CM

## 2021-08-08 DIAGNOSIS — E291 Testicular hypofunction: Secondary | ICD-10-CM | POA: Diagnosis not present

## 2021-08-08 DIAGNOSIS — K760 Fatty (change of) liver, not elsewhere classified: Secondary | ICD-10-CM

## 2021-08-08 DIAGNOSIS — G5701 Lesion of sciatic nerve, right lower limb: Secondary | ICD-10-CM

## 2021-08-08 NOTE — Progress Notes (Signed)
FOLLOW UP  Assessment and Plan:   Hypertension Continue medications; reports controlled at home but persistently elevated today;   Monitor blood pressure at home; patient to call if consistently greater than 130/80 Reviewed DASH diet, weight loss encouraged Follow up [place] in 4-6 weeks to recheck   Reminder to go to the ER if any CP, SOB, nausea, dizziness, severe HA, changes vision/speech, left arm numbness and tingling and jaw pain.  Cholesterol Currently at LDL goal;  Continue low cholesterol diet and exercise.  Check lipid panel.   Other abnormal glucose Recent A1Cs at goal Discussed diet/exercise, weight management  Defer A1C; check CMP  Morbid Obesity ([place]) - BMI 36 with htn, hld, gout Long discussion about weight loss, diet, and exercise Recommended diet heavy in fruits and veggies and low in animal meats, cheeses, and dairy products, appropriate calorie intake Discussed ideal weight for height Patient on phentermine, advised to take a break every 2-3 months, try lower dose of topamax at dinner Reviewed diet in length; recommend he focus on reducing processed carbohydrate intake, Stop soda, biscuits High fiber diet information reviewed and handout given Discussed 3 month OV for accountability, patient declines at this time.   Vitamin D Def At goal at last visit; continue supplementation to maintain goal of 70-100 Defer Vit D level  Piriformis syndrome of right side Continue PT exercises Follow up with sports medicine as needed  Hypogonadism-  continue to monitor, states medication is helping with symptoms of low T. Check testosterone levels at CPE  Gout Continue allopurinol Diet discussed Check uric acid as needed  Elevated LFTs/fatty liver Hepatitis panel has been checked and negative Fatty liver on [place] Weigth loss encouraged; Low processed carb diet and fatty liver discussed  Rheumatoid arthritis Followed by Dr. Dossie Der; well controlled on enbrel     Continue diet and meds as discussed. Further disposition pending results of labs. Discussed med's effects and SE's.   Over 30 minutes of exam, counseling, chart review, and critical decision making was performed.   Future Appointments  Date Time Provider [place]  04/05/2022  9:00 AM Alycia Rossetti, NP [facility] None    ----------------------------------------------------------------------------------------------------------------------  HPI 61 y.o. male  presents for 6 month follow up on hypertension, cholesterol, glucose management, obesity, hypogonadism and vitamin D deficiency.    He has been dealing with right piriformis pain x 6 weeks and Dr Dossie Der had him see sports medicine.  He is doing PT exercises, and completed prednisone 5 day course.  Symptoms are about 90% improved.   He is followed by Dr. Dossie Der for RA since 2004 and is on enbrel with excellent control.   He had [place] abd 01/2018 that showed mild non-obstructing gallstones and likely fatty liver changes.   BMI is Body mass index is 35.18 kg/m., he is working on diet and exercise. Tries to get out and work in yard an hour most days. He has been doing more manual labor and developed piriformis so now limiting activity.  Wt Readings from Last 3 Encounters:  08/08/21 245 lb 3.2 oz (111.2 kg)  07/28/21 244 lb 3.2 oz (110.8 kg)  04/04/21 241 lb 9.6 oz (109.6 kg)   He does check BP occasionally at home, reports "normal", today their BP is BP: 122/68,  BP Readings from Last 3 Encounters:  08/08/21 122/68  07/28/21 120/80  04/04/21 138/84  He does not workout. He denies chest pain, shortness of breath, dizziness.     He is on cholesterol medication (rosuvastatin  20 mg on M,W,F,Sun) and denies myalgias. His LDL cholesterol is at goal; his trigs remain mildly elevated. The cholesterol last visit was:  Lab Results  Component Value Date   CHOL 140 04/04/2021   HDL 47 04/04/2021   LDLCALC 72 04/04/2021   TRIG 129  04/04/2021   CHOLHDL 3.0 04/04/2021    He has not been working on diet and exercise for glucose management, and denies foot ulcerations, increased appetite, nausea, paresthesia of the feet, polydipsia, polyuria, visual disturbances, vomiting and weight loss. Last A1C in the office was:  Lab Results  Component Value Date   HGBA1C 5.4 04/04/2021   Patient is on Vitamin D supplement and at goal at last check:    Lab Results  Component Value Date   [place] 04/04/2021     Patient is on allopurinol for gout and does not report a recent flare.  Lab Results  Component Value Date   LABURIC 5.4 04/01/2020   He has a history of testosterone deficiency and is on testosterone replacement, taking 400 mg every 2 week; last injection was 2 weeks ago. He states that the testosterone helps with his energy, libido, muscle mass. Lab Results  Component Value Date   TESTOSTERONE 187 (L) 04/04/2021   He has known fatty liver, LFTs monitored routinely through this office:  Lab Results  Component Value Date   ALT 70 (H) 04/04/2021   AST 36 (H) 04/04/2021   ALKPHOS 69 05/22/2016   BILITOT 1.0 04/04/2021     Current Medications:  Current Outpatient Medications on File Prior to Visit  Medication Sig   allopurinol (ZYLOPRIM) 300 MG tablet Take 300 mg by mouth daily.    aspirin EC 81 MG tablet Take 81 mg by mouth daily.   Cholecalciferol (VITAMIN D PO) Take 5,000 Units by mouth 2 (two) times daily.   ENBREL SURECLICK 50 MG/ML injection    fluocinonide ointment (LIDEX) 0.05 %    losartan (COZAAR) 50 MG tablet TAKE 1 TABLET AT NIGHT FOR BLOOD PRESSURE   Magnesium 500 MG TABS Take 1 tablet by mouth daily.   Multiple Vitamin (MULTIVITAMIN) tablet Take 1 tablet by mouth daily.   naproxen (NAPROSYN) 500 MG tablet Take 500 mg by mouth 2 (two) times daily with a meal.   Omega-3 Fatty Acids (FISH OIL) 1200 MG CAPS Take by mouth 2 (two) times daily.   rosuvastatin (CRESTOR) 20 MG tablet Take 1 tablet (20 mg  total) by mouth daily. for cholesterol   testosterone cypionate (DEPOTESTOSTERONE CYPIONATE) 200 MG/ML injection INJECT 1 ML INTRAMUSCULARLY EVERY 7 DAYS. DISCARD THE REMAINDER.   traMADol (ULTRAM) 50 MG tablet Take by mouth every 6 (six) hours as needed.   vitamin B-12 (CYANOCOBALAMIN) 1000 MCG tablet Take 1,000 mcg by mouth daily.   vitamin C (ASCORBIC ACID) 500 MG tablet Take 500 mg by mouth 2 (two) times daily.   Zinc 50 MG TABS Take 1 tablet by mouth daily.   SYRINGE-NEEDLE, DISP, 3 ML (B-D 3CC LUER-LOK SYR 21GX1") 21G X 1" 3 ML MISC Use 1 syringe with injection   No current facility-administered medications on file prior to visit.     Allergies:  Allergies  Allergen Reactions   Lisinopril Cough     Medical History:  Past Medical History:  Diagnosis Date   Arthritis    Reiters Polyarthritis   Cholelithiases 02/04/2018   Per Abd [place] 01/2018 with fatty liver, no inflammation or obstruction   Elevated hemoglobin A1c measurement  Gout    Hyperlipidemia    Hypertension    Rheumatoid arthritis ([place])    Family history- Reviewed and unchanged Social history- Reviewed and unchanged   Review of Systems:  Review of Systems  Constitutional:  Negative for malaise/fatigue and weight loss.  HENT:  Negative for hearing loss and tinnitus.   Eyes:  Negative for blurred vision and double vision.  Respiratory:  Negative for cough, shortness of breath and wheezing.   Cardiovascular:  Negative for chest pain, palpitations, orthopnea, claudication and leg swelling.  Gastrointestinal:  Negative for abdominal pain, blood in stool, constipation, diarrhea, heartburn, melena, nausea and vomiting.  Genitourinary: Negative.   Musculoskeletal:  Positive for myalgias (right leg). Negative for joint pain.  Skin:  Negative for rash.  Neurological:  Negative for dizziness, tingling, sensory change, weakness and headaches.  Endo/Heme/Allergies:  Negative for polydipsia.  Psychiatric/Behavioral:  Negative.    All other systems reviewed and are negative.   Physical Exam: BP 122/68   Pulse 71   Temp (!) 97.5 F (36.4 C)   Ht '5\' 10"'$  (1.778 m)   Wt 245 lb 3.2 oz (111.2 kg)   SpO2 98%   BMI 35.18 kg/m  Wt Readings from Last 3 Encounters:  08/08/21 245 lb 3.2 oz (111.2 kg)  07/28/21 244 lb 3.2 oz (110.8 kg)  04/04/21 241 lb 9.6 oz (109.6 kg)   General Appearance: Well nourished, obese male in no apparent distress. Eyes: PERRLA, EOMs, conjunctiva no swelling or erythema Sinuses: No Frontal/maxillary tenderness ENT/Mouth: Ext aud canals clear, TMs without erythema, bulging. No erythema, swelling, or exudate on post pharynx.  Tonsils not swollen or erythematous. Hearing normal.  Neck: Supple, thyroid normal.  Respiratory: Respiratory effort normal, BS equal bilaterally without rales, rhonchi, wheezing or stridor.  Cardio: RRR with no MRGs. Brisk peripheral pulses without edema.  Abdomen: Soft, obese abodmen + BS.  Non tender, no guarding, rebound, hernias, masses. Lymphatics: Non tender without lymphadenopathy.  Musculoskeletal: Full ROM, 5/5 strength, Normal gait Skin: Warm, dry without rashes, lesions, ecchymosis.  Neuro: Cranial nerves intact. No cerebellar symptoms.  Psych: Awake and oriented X 3, normal affect, Insight and Judgment appropriate.    Alycia Rossetti, NP 9:43 AM [facility]

## 2021-08-09 LAB — COMPLETE METABOLIC PANEL WITH GFR
AG Ratio: 1.8 (calc) (ref 1.0–2.5)
ALT: 64 U/L — ABNORMAL HIGH (ref 9–46)
AST: 32 U/L (ref 10–35)
Albumin: 4.1 g/dL (ref 3.6–5.1)
Alkaline phosphatase (APISO): 75 U/L (ref 35–144)
BUN: 17 mg/dL (ref 7–25)
CO2: 30 mmol/L (ref 20–32)
Calcium: 9.6 mg/dL (ref 8.6–10.3)
Chloride: 100 mmol/L (ref 98–110)
Creat: 1.28 mg/dL (ref 0.70–1.35)
Globulin: 2.3 g/dL (calc) (ref 1.9–3.7)
Glucose, Bld: 72 mg/dL (ref 65–99)
Potassium: 5.3 mmol/L (ref 3.5–5.3)
Sodium: 137 mmol/L (ref 135–146)
Total Bilirubin: 0.8 mg/dL (ref 0.2–1.2)
Total Protein: 6.4 g/dL (ref 6.1–8.1)
eGFR: 64 mL/min/{1.73_m2} (ref >=60)

## 2021-08-09 LAB — CBC WITH DIFFERENTIAL/PLATELET
Absolute Monocytes: 1546 cells/uL — ABNORMAL HIGH (ref 200–950)
Basophils Absolute: 163 cells/uL (ref 0–200)
Basophils Relative: 1.7 %
Eosinophils Absolute: 682 cells/uL — ABNORMAL HIGH (ref 15–500)
Eosinophils Relative: 7.1 %
HCT: 49.5 % (ref 38.5–50.0)
Hemoglobin: 17.1 g/dL (ref 13.2–17.1)
Lymphs Abs: 1114 cells/uL (ref 850–3900)
MCH: 31.5 pg (ref 27.0–33.0)
MCHC: 34.5 g/dL (ref 32.0–36.0)
MCV: 91.2 fL (ref 80.0–100.0)
MPV: 10.3 fL (ref 7.5–12.5)
Monocytes Relative: 16.1 %
Neutro Abs: 6096 cells/uL (ref 1500–7800)
Neutrophils Relative %: 63.5 %
Platelets: 249 10*3/uL (ref 140–400)
RBC: 5.43 10*6/uL (ref 4.20–5.80)
RDW: 13.1 % (ref 11.0–15.0)
Total Lymphocyte: 11.6 %
WBC: 9.6 10*3/uL (ref 3.8–10.8)

## 2021-08-09 LAB — MAGNESIUM: Magnesium: 2.1 mg/dL (ref 1.5–2.5)

## 2021-08-09 LAB — LIPID PANEL
Cholesterol: 145 mg/dL (ref <200)
HDL: 39 mg/dL — ABNORMAL LOW (ref >=40)
LDL Cholesterol (Calc): 83 mg/dL (calc)
Non-HDL Cholesterol (Calc): 106 mg/dL (calc) (ref <130)
Total CHOL/HDL Ratio: 3.7 (calc) (ref <5.0)
Triglycerides: 135 mg/dL (ref <150)

## 2021-08-09 LAB — TESTOSTERONE: Testosterone: 1953 ng/dL — ABNORMAL HIGH (ref 250–827)

## 2021-09-07 NOTE — Progress Notes (Unsigned)
Assessment and Plan:  There are no diagnoses linked to this encounter.  1. Flu-like symptoms Covid & Flu Negative Continue Sudafed, Dayquil, Nyquil Stay well hydrated  - POCT Influenza A/B - CBC with Differential/Platelet - COMPLETE METABOLIC PANEL WITH GFR - azithromycin (ZITHROMAX) 250 MG tablet; Take 2 tablets (500 mg) on Day 1 followed by 1 tablet  (250 mg) daily until complete.  Dispense: 6 each; Refill: 1  2. Chills Monitor for increase in fever. Tylenol OTC PRN as directed.  - POC COVID-19 - CBC with Differential/Platelet - COMPLETE METABOLIC PANEL WITH GFR - azithromycin (ZITHROMAX) 250 MG tablet; Take 2 tablets (500 mg) on Day 1 followed by 1 tablet  (250 mg) daily until complete.  Dispense: 6 each; Refill: 1  3. Weakness Rest  - CBC with Differential/Platelet - COMPLETE METABOLIC PANEL WITH GFR - azithromycin (ZITHROMAX) 250 MG tablet; Take 2 tablets (500 mg) on Day 1 followed by 1 tablet  (250 mg) daily until complete.  Dispense: 6 each; Refill: 1  4. Other fatigue Rest Stay well hydrated.  - CBC with Differential/Platelet - COMPLETE METABOLIC PANEL WITH GFR - azithromycin (ZITHROMAX) 250 MG tablet; Take 2 tablets (500 mg) on Day 1 followed by 1 tablet  (250 mg) daily until complete.  Dispense: 6 each; Refill: 1  5. Acute non-recurrent frontal sinusitis - azithromycin (ZITHROMAX) 250 MG tablet; Take 2 tablets (500 mg) on Day 1 followed by 1 tablet  (250 mg) daily until complete.  Dispense: 6 each; Refill: 1  6. Primary cough headache Tylenol PRN as directed.   Notify office if symptoms fail to improve.  May need further workup with CXR to assess for any PNA.    Further disposition pending results of labs. Discussed med's effects and SE's.   Over  15 minutes of exam, counseling, chart review, and critical decision making was performed.   Future Appointments  Date Time Provider [place]  02/08/2022  9:30 AM Alycia Rossetti, NP [facility] None   04/05/2022  9:00 AM Alycia Rossetti, NP [facility] None    ------------------------------------------------------------------------------------------------------------------   HPI BP 122/76   Pulse 93   Temp 97.7 F (36.5 C)   Ht '5\' 10"'$  (1.778 m)   Wt 249 lb (112.9 kg)   SpO2 93%   BMI 35.73 kg/m   Subjective:     Thomas Waller is a 61 y.o. male who presents for evaluation of symptoms of a URI, possible sinusitis. Symptoms include achiness, cough described as nonproductive, headache described as pressure ache, nasal congestion, nausea without vomiting, non productive cough, post nasal drip, shortness of breath, sinus pressure, and fatigue, sleeping 14 hours a day and feeling bloated . Onset of symptoms was 5 days ago, and has been gradually improving since that time, however still symptomatic.  He has been taking Sudafed, Nyquil and Dayquil.  He took a home Covid test 8/19 which was negative. Denies recent travel, constipation, recent insect bite.    Past Medical History:  Diagnosis Date   Arthritis    Reiters Polyarthritis   Cholelithiases 02/04/2018   Per Abd [place] 01/2018 with fatty liver, no inflammation or obstruction   Elevated hemoglobin A1c measurement    Gout    Hyperlipidemia    Hypertension    Rheumatoid arthritis ([facility])      Allergies  Allergen Reactions   Lisinopril Cough    Current Outpatient Medications on File Prior to Visit  Medication Sig   allopurinol (ZYLOPRIM) 300 MG tablet Take 300 mg  by mouth daily.    aspirin EC 81 MG tablet Take 81 mg by mouth daily.   Cholecalciferol (VITAMIN D PO) Take 5,000 Units by mouth 2 (two) times daily.   ENBREL SURECLICK 50 MG/ML injection    fluocinonide ointment (LIDEX) 0.05 %    losartan (COZAAR) 50 MG tablet TAKE 1 TABLET AT NIGHT FOR BLOOD PRESSURE   Magnesium 500 MG TABS Take 1 tablet by mouth daily.   Multiple Vitamin (MULTIVITAMIN) tablet Take 1 tablet by mouth daily.   naproxen (NAPROSYN) 500 MG tablet Take  500 mg by mouth 2 (two) times daily with a meal.   Omega-3 Fatty Acids (FISH OIL) 1200 MG CAPS Take by mouth 2 (two) times daily.   rosuvastatin (CRESTOR) 20 MG tablet Take 1 tablet (20 mg total) by mouth daily. for cholesterol   SYRINGE-NEEDLE, DISP, 3 ML (B-D 3CC LUER-LOK SYR 21GX1") 21G X 1" 3 ML MISC Use 1 syringe with injection   testosterone cypionate (DEPOTESTOSTERONE CYPIONATE) 200 MG/ML injection INJECT 1 ML INTRAMUSCULARLY EVERY 7 DAYS. DISCARD THE REMAINDER.   traMADol (ULTRAM) 50 MG tablet Take by mouth every 6 (six) hours as needed.   vitamin B-12 (CYANOCOBALAMIN) 1000 MCG tablet Take 1,000 mcg by mouth daily.   vitamin C (ASCORBIC ACID) 500 MG tablet Take 500 mg by mouth 2 (two) times daily.   Zinc 50 MG TABS Take 1 tablet by mouth daily.   No current facility-administered medications on file prior to visit.    ROS: all negative except above.   Physical Exam:  BP 122/76   Pulse 93   Temp 97.7 F (36.5 C)   Ht '5\' 10"'$  (1.778 m)   Wt 249 lb (112.9 kg)   SpO2 93%   BMI 35.73 kg/m   General Appearance: Well nourished, in no apparent distress. Eyes: PERRLA, EOMs, conjunctiva no swelling or erythema Sinuses: No Frontal/maxillary tenderness ENT/Mouth: Ext aud canals clear, TMs without erythema, bulging. No erythema, swelling, or exudate on post pharynx.  Tonsils not swollen or erythematous. Hearing normal.  Neck: Supple, thyroid normal.  Respiratory: Respiratory effort normal, BS equal bilaterally without rales, rhonchi, wheezing or stridor.  Cardio: RRR with no MRGs. Brisk peripheral pulses without edema.  Abdomen: Soft, + BS.  Non tender, no guarding, rebound, hernias, masses. Lymphatics: Non tender without lymphadenopathy.  Musculoskeletal: Full ROM, 5/5 strength, normal gait.  Skin: Warm, dry without rashes, lesions, ecchymosis.  Neuro: Cranial nerves intact. Normal muscle tone, no cerebellar symptoms. Sensation intact.  Psych: Awake and oriented X 3, normal affect,  Insight and Judgment appropriate.     Darrol Jump, NP 12:21 PM [facility]

## 2021-09-08 ENCOUNTER — Encounter: Payer: Self-pay | Admitting: Nurse Practitioner

## 2021-09-08 ENCOUNTER — Ambulatory Visit (INDEPENDENT_AMBULATORY_CARE_PROVIDER_SITE_OTHER): Payer: BC Managed Care – PPO | Admitting: Nurse Practitioner

## 2021-09-08 VITALS — BP 122/76 | HR 93 | Temp 97.7°F | Ht 70.0 in | Wt 249.0 lb

## 2021-09-08 DIAGNOSIS — J0110 Acute frontal sinusitis, unspecified: Secondary | ICD-10-CM

## 2021-09-08 DIAGNOSIS — R5383 Other fatigue: Secondary | ICD-10-CM | POA: Diagnosis not present

## 2021-09-08 DIAGNOSIS — R6883 Chills (without fever): Secondary | ICD-10-CM | POA: Diagnosis not present

## 2021-09-08 DIAGNOSIS — G4483 Primary cough headache: Secondary | ICD-10-CM

## 2021-09-08 DIAGNOSIS — R6889 Other general symptoms and signs: Secondary | ICD-10-CM | POA: Diagnosis not present

## 2021-09-08 DIAGNOSIS — R531 Weakness: Secondary | ICD-10-CM | POA: Diagnosis not present

## 2021-09-08 LAB — POCT INFLUENZA A/B

## 2021-09-08 LAB — POC COVID19 BINAXNOW

## 2021-09-08 MED ORDER — AZITHROMYCIN 250 MG PO TABS: ORAL_TABLET | ORAL | 1 refills | Status: DC

## 2021-09-08 NOTE — Patient Instructions (Signed)

## 2021-09-09 ENCOUNTER — Other Ambulatory Visit: Payer: Self-pay | Admitting: Nurse Practitioner

## 2021-09-09 DIAGNOSIS — D72828 Other elevated white blood cell count: Secondary | ICD-10-CM

## 2021-09-09 LAB — CBC WITH DIFFERENTIAL/PLATELET
Absolute Monocytes: 2855 cells/uL — ABNORMAL HIGH (ref 200–950)
Basophils Absolute: 234 cells/uL — ABNORMAL HIGH (ref 0–200)
Basophils Relative: 2 %
Eosinophils Absolute: 515 cells/uL — ABNORMAL HIGH (ref 15–500)
Eosinophils Relative: 4.4 %
HCT: 49.5 % (ref 38.5–50.0)
Hemoglobin: 17 g/dL (ref 13.2–17.1)
Lymphs Abs: 573 cells/uL — ABNORMAL LOW (ref 850–3900)
MCH: 30.3 pg (ref 27.0–33.0)
MCHC: 34.3 g/dL (ref 32.0–36.0)
MCV: 88.2 fL (ref 80.0–100.0)
MPV: 11.5 fL (ref 7.5–12.5)
Monocytes Relative: 24.4 %
Neutro Abs: 7523 cells/uL (ref 1500–7800)
Neutrophils Relative %: 64.3 %
Platelets: 192 10*3/uL (ref 140–400)
RBC: 5.61 10*6/uL (ref 4.20–5.80)
RDW: 14.4 % (ref 11.0–15.0)
Total Lymphocyte: 4.9 %
WBC: 11.7 10*3/uL — ABNORMAL HIGH (ref 3.8–10.8)

## 2021-09-09 LAB — COMPLETE METABOLIC PANEL WITH GFR
AG Ratio: 1.5 (calc) (ref 1.0–2.5)
ALT: 66 U/L — ABNORMAL HIGH (ref 9–46)
AST: 54 U/L — ABNORMAL HIGH (ref 10–35)
Albumin: 3.7 g/dL (ref 3.6–5.1)
Alkaline phosphatase (APISO): 214 U/L — ABNORMAL HIGH (ref 35–144)
BUN/Creatinine Ratio: 12 (calc) (ref 6–22)
BUN: 18 mg/dL (ref 7–25)
CO2: 27 mmol/L (ref 20–32)
Calcium: 12.9 mg/dL — ABNORMAL HIGH (ref 8.6–10.3)
Chloride: 97 mmol/L — ABNORMAL LOW (ref 98–110)
Creat: 1.5 mg/dL — ABNORMAL HIGH (ref 0.70–1.35)
Globulin: 2.5 g/dL (calc) (ref 1.9–3.7)
Glucose, Bld: 96 mg/dL (ref 65–99)
Potassium: 4.7 mmol/L (ref 3.5–5.3)
Sodium: 135 mmol/L (ref 135–146)
Total Bilirubin: 1 mg/dL (ref 0.2–1.2)
Total Protein: 6.2 g/dL (ref 6.1–8.1)
eGFR: 53 mL/min/{1.73_m2} — ABNORMAL LOW (ref >=60)

## 2021-09-11 ENCOUNTER — Encounter (HOSPITAL_BASED_OUTPATIENT_CLINIC_OR_DEPARTMENT_OTHER): Payer: Self-pay | Admitting: Emergency Medicine

## 2021-09-11 ENCOUNTER — Other Ambulatory Visit: Payer: Self-pay

## 2021-09-11 ENCOUNTER — Encounter (HOSPITAL_COMMUNITY): Payer: Self-pay

## 2021-09-11 ENCOUNTER — Emergency Department (HOSPITAL_BASED_OUTPATIENT_CLINIC_OR_DEPARTMENT_OTHER): Payer: BC Managed Care – PPO

## 2021-09-11 ENCOUNTER — Inpatient Hospital Stay (HOSPITAL_BASED_OUTPATIENT_CLINIC_OR_DEPARTMENT_OTHER)
Admission: EM | Admit: 2021-09-11 | Discharge: 2021-09-15 | DRG: 824 | Disposition: A | Discharge status: Home / Self Care | Payer: BC Managed Care – PPO | Attending: Internal Medicine | Admitting: Internal Medicine

## 2021-09-11 ENCOUNTER — Emergency Department (HOSPITAL_BASED_OUTPATIENT_CLINIC_OR_DEPARTMENT_OTHER): Payer: BC Managed Care – PPO | Admitting: Radiology

## 2021-09-11 DIAGNOSIS — Z79899 Other long term (current) drug therapy: Secondary | ICD-10-CM

## 2021-09-11 DIAGNOSIS — R590 Localized enlarged lymph nodes: Secondary | ICD-10-CM | POA: Diagnosis not present

## 2021-09-11 DIAGNOSIS — K760 Fatty (change of) liver, not elsewhere classified: Secondary | ICD-10-CM | POA: Diagnosis not present

## 2021-09-11 DIAGNOSIS — C8588 Other specified types of non-Hodgkin lymphoma, lymph nodes of multiple sites: Principal | ICD-10-CM | POA: Diagnosis present

## 2021-09-11 DIAGNOSIS — E782 Mixed hyperlipidemia: Secondary | ICD-10-CM | POA: Diagnosis not present

## 2021-09-11 DIAGNOSIS — Z8249 Family history of ischemic heart disease and other diseases of the circulatory system: Secondary | ICD-10-CM | POA: Diagnosis not present

## 2021-09-11 DIAGNOSIS — Z7982 Long term (current) use of aspirin: Secondary | ICD-10-CM

## 2021-09-11 DIAGNOSIS — Z20822 Contact with and (suspected) exposure to covid-19: Secondary | ICD-10-CM | POA: Diagnosis present

## 2021-09-11 DIAGNOSIS — J90 Pleural effusion, not elsewhere classified: Secondary | ICD-10-CM | POA: Diagnosis not present

## 2021-09-11 DIAGNOSIS — N183 Chronic kidney disease, stage 3 unspecified: Secondary | ICD-10-CM | POA: Diagnosis not present

## 2021-09-11 DIAGNOSIS — R591 Generalized enlarged lymph nodes: Secondary | ICD-10-CM

## 2021-09-11 DIAGNOSIS — M1000 Idiopathic gout, unspecified site: Secondary | ICD-10-CM | POA: Diagnosis present

## 2021-09-11 DIAGNOSIS — R627 Adult failure to thrive: Secondary | ICD-10-CM | POA: Diagnosis not present

## 2021-09-11 DIAGNOSIS — R140 Abdominal distension (gaseous): Secondary | ICD-10-CM | POA: Diagnosis not present

## 2021-09-11 DIAGNOSIS — N179 Acute kidney failure, unspecified: Secondary | ICD-10-CM | POA: Diagnosis not present

## 2021-09-11 DIAGNOSIS — R5383 Other fatigue: Secondary | ICD-10-CM | POA: Diagnosis not present

## 2021-09-11 DIAGNOSIS — R634 Abnormal weight loss: Secondary | ICD-10-CM | POA: Diagnosis not present

## 2021-09-11 DIAGNOSIS — E860 Dehydration: Secondary | ICD-10-CM | POA: Diagnosis not present

## 2021-09-11 DIAGNOSIS — Z6836 Body mass index (BMI) 36.0-36.9, adult: Secondary | ICD-10-CM

## 2021-09-11 DIAGNOSIS — R509 Fever, unspecified: Secondary | ICD-10-CM | POA: Diagnosis not present

## 2021-09-11 DIAGNOSIS — M069 Rheumatoid arthritis, unspecified: Secondary | ICD-10-CM | POA: Diagnosis not present

## 2021-09-11 DIAGNOSIS — I129 Hypertensive chronic kidney disease with stage 1 through stage 4 chronic kidney disease, or unspecified chronic kidney disease: Secondary | ICD-10-CM | POA: Diagnosis present

## 2021-09-11 DIAGNOSIS — M109 Gout, unspecified: Secondary | ICD-10-CM | POA: Diagnosis present

## 2021-09-11 DIAGNOSIS — R5381 Other malaise: Secondary | ICD-10-CM | POA: Diagnosis not present

## 2021-09-11 DIAGNOSIS — I2510 Atherosclerotic heart disease of native coronary artery without angina pectoris: Secondary | ICD-10-CM | POA: Diagnosis not present

## 2021-09-11 DIAGNOSIS — R531 Weakness: Secondary | ICD-10-CM | POA: Diagnosis not present

## 2021-09-11 DIAGNOSIS — J9811 Atelectasis: Secondary | ICD-10-CM | POA: Diagnosis not present

## 2021-09-11 DIAGNOSIS — Z79891 Long term (current) use of opiate analgesic: Secondary | ICD-10-CM

## 2021-09-11 DIAGNOSIS — R110 Nausea: Secondary | ICD-10-CM | POA: Diagnosis not present

## 2021-09-11 DIAGNOSIS — I10 Essential (primary) hypertension: Secondary | ICD-10-CM | POA: Diagnosis present

## 2021-09-11 DIAGNOSIS — R609 Edema, unspecified: Secondary | ICD-10-CM | POA: Diagnosis not present

## 2021-09-11 DIAGNOSIS — R1110 Vomiting, unspecified: Secondary | ICD-10-CM | POA: Diagnosis not present

## 2021-09-11 DIAGNOSIS — Z888 Allergy status to other drugs, medicaments and biological substances status: Secondary | ICD-10-CM | POA: Diagnosis not present

## 2021-09-11 DIAGNOSIS — R9431 Abnormal electrocardiogram [ECG] [EKG]: Secondary | ICD-10-CM | POA: Diagnosis not present

## 2021-09-11 DIAGNOSIS — R1084 Generalized abdominal pain: Secondary | ICD-10-CM | POA: Diagnosis not present

## 2021-09-11 DIAGNOSIS — Z79890 Hormone replacement therapy (postmenopausal): Secondary | ICD-10-CM

## 2021-09-11 DIAGNOSIS — E669 Obesity, unspecified: Secondary | ICD-10-CM | POA: Diagnosis present

## 2021-09-11 DIAGNOSIS — R109 Unspecified abdominal pain: Secondary | ICD-10-CM | POA: Diagnosis not present

## 2021-09-11 LAB — CBC WITH DIFFERENTIAL/PLATELET
Abs Immature Granulocytes: 0.13 10*3/uL — ABNORMAL HIGH (ref 0.00–0.07)
Basophils Absolute: 0.2 10*3/uL — ABNORMAL HIGH (ref 0.0–0.1)
Basophils Relative: 2 %
Eosinophils Absolute: 0.5 10*3/uL (ref 0.0–0.5)
Eosinophils Relative: 3 %
HCT: 47.3 % (ref 39.0–52.0)
Hemoglobin: 16.4 g/dL (ref 13.0–17.0)
Immature Granulocytes: 1 %
Lymphocytes Relative: 5 %
Lymphs Abs: 0.7 10*3/uL (ref 0.7–4.0)
MCH: 30 pg (ref 26.0–34.0)
MCHC: 34.7 g/dL (ref 30.0–36.0)
MCV: 86.5 fL (ref 80.0–100.0)
Monocytes Absolute: 4.1 10*3/uL — ABNORMAL HIGH (ref 0.1–1.0)
Monocytes Relative: 26 %
Neutro Abs: 9.8 10*3/uL — ABNORMAL HIGH (ref 1.7–7.7)
Neutrophils Relative %: 63 %
Platelets: 193 10*3/uL (ref 150–400)
RBC: 5.47 MIL/uL (ref 4.22–5.81)
RDW: 14.9 % (ref 11.5–15.5)
Smear Review: NORMAL
WBC: 15.4 10*3/uL — ABNORMAL HIGH (ref 4.0–10.5)
nRBC: 0 % (ref 0.0–0.2)

## 2021-09-11 LAB — COMPREHENSIVE METABOLIC PANEL
ALT: 55 U/L — ABNORMAL HIGH (ref 0–44)
AST: 45 U/L — ABNORMAL HIGH (ref 15–41)
Albumin: 3.5 g/dL (ref 3.5–5.0)
Alkaline Phosphatase: 177 U/L — ABNORMAL HIGH (ref 38–126)
Anion gap: 6 (ref 5–15)
BUN: 39 mg/dL — ABNORMAL HIGH (ref 6–20)
CO2: 32 mmol/L (ref 22–32)
Calcium: 16.9 mg/dL (ref 8.9–10.3)
Chloride: 95 mmol/L — ABNORMAL LOW (ref 98–111)
Creatinine, Ser: 1.68 mg/dL — ABNORMAL HIGH (ref 0.61–1.24)
GFR, Estimated: 46 mL/min — ABNORMAL LOW (ref >60)
Glucose, Bld: 85 mg/dL (ref 70–99)
Potassium: 4.8 mmol/L (ref 3.5–5.1)
Sodium: 133 mmol/L — ABNORMAL LOW (ref 135–145)
Total Bilirubin: 1 mg/dL (ref 0.3–1.2)
Total Protein: 6.1 g/dL — ABNORMAL LOW (ref 6.5–8.1)

## 2021-09-11 LAB — RESP PANEL BY RT-PCR (FLU A&B, COVID) ARPGX2
Influenza A by PCR: NEGATIVE
Influenza B by PCR: NEGATIVE
SARS Coronavirus 2 by RT PCR: NEGATIVE

## 2021-09-11 LAB — LIPASE, BLOOD: Lipase: 45 U/L (ref 11–51)

## 2021-09-11 LAB — TROPONIN I (HIGH SENSITIVITY): Troponin I (High Sensitivity): 15 ng/L (ref <18)

## 2021-09-11 MED ORDER — ZOLEDRONIC ACID 5 MG/100ML IV SOLN: 5.0000 mg | Freq: Once | INTRAVENOUS | Status: DC

## 2021-09-11 MED ORDER — LOSARTAN POTASSIUM 50 MG PO TABS
50.0000 mg | ORAL_TABLET | Freq: Every day | ORAL | Status: DC
Start: 2021-09-12 — End: 2021-09-12
  Administered 2021-09-12: 50 mg via ORAL
  Filled 2021-09-11 (×2): qty 2

## 2021-09-11 MED ORDER — IOHEXOL 300 MG/ML  SOLN
100.0000 mL | Freq: Once | INTRAMUSCULAR | Status: AC | PRN
  Administered 2021-09-11: 80 mL via INTRAVENOUS

## 2021-09-11 MED ORDER — SODIUM CHLORIDE 0.9 % IV SOLN: INTRAVENOUS | Status: AC

## 2021-09-11 MED ORDER — SODIUM CHLORIDE 0.9 % IV BOLUS
1000.0000 mL | Freq: Once | INTRAVENOUS | Status: AC
  Administered 2021-09-11: 1000 mL via INTRAVENOUS

## 2021-09-11 MED ORDER — ASPIRIN 81 MG PO TBEC
81.0000 mg | DELAYED_RELEASE_TABLET | Freq: Every day | ORAL | Status: DC
  Administered 2021-09-12 – 2021-09-15 (×4): 81 mg via ORAL
  Filled 2021-09-11 (×6): qty 1

## 2021-09-11 MED ORDER — CALCITONIN (SALMON) 200 UNIT/ML IJ SOLN
400.0000 [IU] | Freq: Two times a day (BID) | INTRAMUSCULAR | Status: DC
  Administered 2021-09-12: 400 [IU] via INTRAMUSCULAR
  Filled 2021-09-11 (×5): qty 2

## 2021-09-11 MED ORDER — KETOROLAC TROMETHAMINE 30 MG/ML IJ SOLN
30.0000 mg | Freq: Once | INTRAMUSCULAR | Status: AC
  Administered 2021-09-11: 30 mg via INTRAVENOUS
  Filled 2021-09-11: qty 1

## 2021-09-11 MED ORDER — TRAMADOL HCL 50 MG PO TABS
50.0000 mg | ORAL_TABLET | Freq: Four times a day (QID) | ORAL | Status: DC | PRN
  Administered 2021-09-12 – 2021-09-15 (×4): 50 mg via ORAL
  Filled 2021-09-11 (×4): qty 1

## 2021-09-11 MED ORDER — IOHEXOL 300 MG/ML  SOLN
100.0000 mL | Freq: Once | INTRAMUSCULAR | Status: AC | PRN
  Administered 2021-09-11: 65 mL via INTRAVENOUS

## 2021-09-11 MED ORDER — ROSUVASTATIN CALCIUM 20 MG PO TABS
20.0000 mg | ORAL_TABLET | Freq: Every day | ORAL | Status: DC
  Administered 2021-09-12: 20 mg via ORAL
  Filled 2021-09-11 (×2): qty 1

## 2021-09-11 NOTE — Progress Notes (Incomplete)
Plan of Care Note for accepted transfer   Patient: Thomas Waller MRN: [id number]   [place]: 09/11/2021  Facility requesting transfer: *** Requesting Provider: *** Reason for transfer: *** Facility course: ***  Plan of care: The patient is accepted for admission to {Level_of_Care:26774} unit, at {Campus_List:26775}..  ***  Author: Vernelle Emerald, MD 09/11/2021  Check [email or web address] for on-call coverage.  Nursing staff, Please call [place] number on Amion as soon as patient's arrival, so appropriate admitting provider can evaluate the pt.

## 2021-09-11 NOTE — ED Triage Notes (Signed)
Last Saturday, pt started noticing bloating, vomiting phlegm (clear) 2x day, lethargic, fatigue, chills, covid negative at home x3. On Wednesday went to primary and covid negative there as well, told him he was dehydrated. Sent home.

## 2021-09-11 NOTE — ED Notes (Signed)
Calcium 16.9.Dr. Langston Masker and primary RN aware.

## 2021-09-11 NOTE — ED Provider Notes (Signed)
[facility] EMERGENCY DEPT Provider Note   CSN: [id number] Arrival date & time: 09/11/21  1558     History {Add pertinent medical, surgical, social history, OB history to HPI:1} No chief complaint on file.   Thomas Waller is a 61 y.o. male presented ED with general malaise, ongoing for 7 days.  He reports he has had intermittent headache, heaviness, fatigue, body aches, nausea, poor appetite, shortness of breath for 7 days.  Reports he had a negative COVID test as an outpatient at home 3 days ago.  He also reports he has had some lower abdominal tenderness for some time, few days.  Most locally in the right lower side.  No history of abdominal surgery  HPI     Home Medications Prior to Admission medications   Medication Sig Start Date End Date Taking? Authorizing Provider  allopurinol (ZYLOPRIM) 300 MG tablet Take 300 mg by mouth daily.     [provider]  aspirin EC 81 MG tablet Take 81 mg by mouth daily.    [provider]  azithromycin (ZITHROMAX) 250 MG tablet Take 2 tablets (500 mg) on Day 1 followed by 1 tablet  (250 mg) daily until complete. 09/08/21   Darrol Jump, NP  Cholecalciferol (VITAMIN D PO) Take 5,000 Units by mouth 2 (two) times daily.    [provider]  ENBREL SURECLICK 50 MG/ML injection  10/13/13   [provider]  fluocinonide ointment (LIDEX) 0.05 %  10/06/15   [provider]  losartan (COZAAR) 50 MG tablet TAKE 1 TABLET AT NIGHT FOR BLOOD PRESSURE 12/12/20   Unk Pinto, MD  Magnesium 500 MG TABS Take 1 tablet by mouth daily.    [provider]  Multiple Vitamin (MULTIVITAMIN) tablet Take 1 tablet by mouth daily.    [provider]  naproxen (NAPROSYN) 500 MG tablet Take 500 mg by mouth 2 (two) times daily with a meal.    [provider]  Omega-3 Fatty Acids (FISH OIL) 1200 MG CAPS Take by mouth 2 (two) times daily.    [provider]  rosuvastatin  (CRESTOR) 20 MG tablet Take 1 tablet (20 mg total) by mouth daily. for cholesterol 05/04/21   Alycia Rossetti, NP  SYRINGE-NEEDLE, DISP, 3 ML (B-D 3CC LUER-LOK SYR 21GX1") 21G X 1" 3 ML MISC Use 1 syringe with injection 06/14/21   Alycia Rossetti, NP  testosterone cypionate (DEPOTESTOSTERONE CYPIONATE) 200 MG/ML injection INJECT 1 ML INTRAMUSCULARLY EVERY 7 DAYS. DISCARD THE REMAINDER. 06/14/21   Alycia Rossetti, NP  traMADol Veatrice [place]bon) 50 MG tablet Take by mouth every 6 (six) hours as needed.    [provider]  vitamin B-12 (CYANOCOBALAMIN) 1000 MCG tablet Take 1,000 mcg by mouth daily.    [provider]  vitamin C (ASCORBIC ACID) 500 MG tablet Take 500 mg by mouth 2 (two) times daily.    [provider]  Zinc 50 MG TABS Take 1 tablet by mouth daily.    [provider]      Allergies    Lisinopril    Review of Systems   Review of Systems  Physical Exam Updated Vital Signs BP 122/79 (BP Location: Left Arm)   Pulse 94   Temp 98 F (36.7 C)   Resp 17   SpO2 94%  Physical Exam Constitutional:      General: He is not in acute distress.    Comments: Appears fatigued  HENT:     Head: Normocephalic and atraumatic.  Eyes:     Conjunctiva/sclera: Conjunctivae normal.     Pupils: Pupils are equal, round, and reactive to light.  Cardiovascular:     Rate and Rhythm: Normal rate and regular rhythm.  Pulmonary:     Effort: Pulmonary effort is normal. No respiratory distress.  Abdominal:     General: There is no distension.     Tenderness: There is abdominal tenderness in the right lower quadrant and suprapubic area.  Skin:    General: Skin is warm and dry.  Neurological:     General: No focal deficit present.     Mental Status: He is alert. Mental status is at baseline.  Psychiatric:        Mood and Affect: Mood normal.        Behavior: Behavior normal.     ED Results / Procedures / Treatments   Labs (all labs ordered are listed, but only  abnormal results are displayed) Labs Reviewed - No data to display  EKG None  Radiology DG Chest 2 View  Result Date: 09/11/2021 CLINICAL DATA:  Weakness.  Fever. EXAM: CHEST - 2 VIEW COMPARISON:  None Available. FINDINGS: Right basilar and left lingular opacities, somewhat platelike. No pneumothorax. No nodules or masses. The cardiomediastinal silhouette is normal. IMPRESSION: Lingular and right basilar opacities. The platelike nature suggests the possibility of atelectasis. However, given history of fever, pneumonia is not excluded. Recommend short-term follow-up to ensure resolution. Electronically Signed   By: Dorise Bullion III M.D.   On: 09/11/2021 16:51    Procedures Procedures  {Document cardiac monitor, telemetry assessment procedure when appropriate:1}  Medications Ordered in ED Medications - No data to display  ED Course/ Medical Decision Making/ A&P                           Medical Decision Making Amount and/or Complexity of Data Reviewed Radiology: ordered.   This patient presents to the ED with concern for fatigue, nausea, myalgias. This involves an extensive number of treatment options, and is a complaint that carries with it a high risk of complications and morbidity.  The differential diagnosis includes syndrome most likely versus other type of infection versus early appendicitis versus UTI versus other   I ordered and personally interpreted labs.  The pertinent results include:  ***  I ordered imaging studies including *** I independently visualized and interpreted imaging which showed *** I agree with the radiologist interpretation  The patient was maintained on a cardiac monitor.  I personally viewed and interpreted the cardiac monitored which showed an underlying rhythm of: ***  Per my interpretation the patient's ECG shows ***  I ordered medication including ***  for *** I have reviewed the patients home medicines and have made adjustments as  needed  Test Considered: ***  I requested consultation with the ***,  and discussed lab and imaging findings as well as pertinent plan - they recommend: ***  After the interventions noted above, I reevaluated the patient and found that they have: {resolved/improved/worsened:23923::"improved"}  Social Determinants of Health:***  Dispostion:  After consideration of the diagnostic results and the patients response to treatment, I feel that the patent would benefit from ***.   {Document critical care time when appropriate:1} {Document review of labs and clinical decision tools ie heart score, Chads2Vasc2 etc:1}  {Document your independent review of radiology images, and any outside records:1} {Document your discussion with family members, caretakers, and with consultants:1} {Document social determinants of  health affecting pt's care:1} {Document your decision making why or why not admission, treatments were needed:1} Final Clinical Impression(s) / ED Diagnoses Final diagnoses:  None    Rx / DC Orders ED Discharge Orders     None

## 2021-09-11 NOTE — Progress Notes (Signed)
Plan of Care Note for accepted transfer   Patient: Thomas Waller MRN: [id number]   [place]: 09/11/2021  Facility requesting transfer: [facility] Requesting Provider: Dr. Langston Masker Reason for transfer: Severe Hypercalcemia Facility course:   61 year old male with past medical history of hypertension, hyperlipidemia, rheumatoid arthritis presenting with a 1 week history of increasing generalized weakness and lethargy as well as progressively worsening lower abdominal pain.  Initial work-up included CT imaging of the abdomen and pelvis revealing extensive intra-abdominal and retroperitoneal lymphadenopathy concerning for possible lymphoma.  Initial work-up revealed profound hypercalcemia of 16.9 as well.  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  Chest x-ray appears to have some streaky infiltrates and therefore advised to extend patient's CT imaging to better evaluate this as well as identify other potential manifestations of malignancy.  Concerning the severe hypercalcemia I advised aggressive intravenous volume resuscitation with normal saline, initiation of zoledronic acid as well as calcitonin.  Plan of care: The patient is accepted for admission to Telemetry unit, at [facility]..    Author: Vernelle Emerald, MD 09/11/2021  Check [email or web address] for on-call coverage.  Nursing staff, Please call [place] number on Amion as soon as patient's arrival, so appropriate admitting provider can evaluate the pt.

## 2021-09-12 DIAGNOSIS — R591 Generalized enlarged lymph nodes: Secondary | ICD-10-CM | POA: Diagnosis present

## 2021-09-12 DIAGNOSIS — R1110 Vomiting, unspecified: Secondary | ICD-10-CM | POA: Diagnosis not present

## 2021-09-12 DIAGNOSIS — J9811 Atelectasis: Secondary | ICD-10-CM | POA: Diagnosis not present

## 2021-09-12 DIAGNOSIS — R627 Adult failure to thrive: Secondary | ICD-10-CM | POA: Diagnosis present

## 2021-09-12 DIAGNOSIS — I2510 Atherosclerotic heart disease of native coronary artery without angina pectoris: Secondary | ICD-10-CM | POA: Diagnosis not present

## 2021-09-12 DIAGNOSIS — E669 Obesity, unspecified: Secondary | ICD-10-CM | POA: Diagnosis present

## 2021-09-12 DIAGNOSIS — G5700 Lesion of sciatic nerve, unspecified lower limb: Secondary | ICD-10-CM | POA: Insufficient documentation

## 2021-09-12 DIAGNOSIS — Z6836 Body mass index (BMI) 36.0-36.9, adult: Secondary | ICD-10-CM | POA: Diagnosis not present

## 2021-09-12 DIAGNOSIS — R5381 Other malaise: Secondary | ICD-10-CM | POA: Diagnosis not present

## 2021-09-12 DIAGNOSIS — M069 Rheumatoid arthritis, unspecified: Secondary | ICD-10-CM | POA: Diagnosis present

## 2021-09-12 DIAGNOSIS — M109 Gout, unspecified: Secondary | ICD-10-CM | POA: Diagnosis present

## 2021-09-12 DIAGNOSIS — Z888 Allergy status to other drugs, medicaments and biological substances status: Secondary | ICD-10-CM | POA: Diagnosis not present

## 2021-09-12 DIAGNOSIS — Z7982 Long term (current) use of aspirin: Secondary | ICD-10-CM | POA: Diagnosis not present

## 2021-09-12 DIAGNOSIS — E782 Mixed hyperlipidemia: Secondary | ICD-10-CM | POA: Diagnosis present

## 2021-09-12 DIAGNOSIS — R110 Nausea: Secondary | ICD-10-CM | POA: Diagnosis not present

## 2021-09-12 DIAGNOSIS — J90 Pleural effusion, not elsewhere classified: Secondary | ICD-10-CM | POA: Diagnosis not present

## 2021-09-12 DIAGNOSIS — K760 Fatty (change of) liver, not elsewhere classified: Secondary | ICD-10-CM | POA: Diagnosis present

## 2021-09-12 DIAGNOSIS — R634 Abnormal weight loss: Secondary | ICD-10-CM | POA: Diagnosis present

## 2021-09-12 DIAGNOSIS — N183 Chronic kidney disease, stage 3 unspecified: Secondary | ICD-10-CM | POA: Diagnosis present

## 2021-09-12 DIAGNOSIS — R140 Abdominal distension (gaseous): Secondary | ICD-10-CM | POA: Diagnosis not present

## 2021-09-12 DIAGNOSIS — R109 Unspecified abdominal pain: Secondary | ICD-10-CM | POA: Diagnosis not present

## 2021-09-12 DIAGNOSIS — N179 Acute kidney failure, unspecified: Secondary | ICD-10-CM | POA: Diagnosis present

## 2021-09-12 DIAGNOSIS — E860 Dehydration: Secondary | ICD-10-CM | POA: Diagnosis present

## 2021-09-12 DIAGNOSIS — Z79890 Hormone replacement therapy (postmenopausal): Secondary | ICD-10-CM | POA: Diagnosis not present

## 2021-09-12 DIAGNOSIS — R5383 Other fatigue: Secondary | ICD-10-CM | POA: Diagnosis not present

## 2021-09-12 DIAGNOSIS — R590 Localized enlarged lymph nodes: Secondary | ICD-10-CM | POA: Diagnosis present

## 2021-09-12 DIAGNOSIS — I129 Hypertensive chronic kidney disease with stage 1 through stage 4 chronic kidney disease, or unspecified chronic kidney disease: Secondary | ICD-10-CM | POA: Diagnosis present

## 2021-09-12 DIAGNOSIS — M1000 Idiopathic gout, unspecified site: Secondary | ICD-10-CM | POA: Diagnosis present

## 2021-09-12 DIAGNOSIS — Z20822 Contact with and (suspected) exposure to covid-19: Secondary | ICD-10-CM | POA: Diagnosis present

## 2021-09-12 DIAGNOSIS — Z79891 Long term (current) use of opiate analgesic: Secondary | ICD-10-CM | POA: Diagnosis not present

## 2021-09-12 DIAGNOSIS — Z8249 Family history of ischemic heart disease and other diseases of the circulatory system: Secondary | ICD-10-CM | POA: Diagnosis not present

## 2021-09-12 DIAGNOSIS — Z79899 Other long term (current) drug therapy: Secondary | ICD-10-CM | POA: Diagnosis not present

## 2021-09-12 DIAGNOSIS — C8588 Other specified types of non-Hodgkin lymphoma, lymph nodes of multiple sites: Secondary | ICD-10-CM | POA: Diagnosis present

## 2021-09-12 LAB — CBC WITH DIFFERENTIAL/PLATELET
Abs Immature Granulocytes: 0.19 10*3/uL — ABNORMAL HIGH (ref 0.00–0.07)
Basophils Absolute: 0.2 10*3/uL — ABNORMAL HIGH (ref 0.0–0.1)
Basophils Relative: 2 %
Eosinophils Absolute: 0.5 10*3/uL (ref 0.0–0.5)
Eosinophils Relative: 3 %
HCT: 50.6 % (ref 39.0–52.0)
Hemoglobin: 16.8 g/dL (ref 13.0–17.0)
Immature Granulocytes: 1 %
Lymphocytes Relative: 4 %
Lymphs Abs: 0.7 10*3/uL (ref 0.7–4.0)
MCH: 30.2 pg (ref 26.0–34.0)
MCHC: 33.2 g/dL (ref 30.0–36.0)
MCV: 90.8 fL (ref 80.0–100.0)
Monocytes Absolute: 3.4 10*3/uL — ABNORMAL HIGH (ref 0.1–1.0)
Monocytes Relative: 22 %
Neutro Abs: 10.7 10*3/uL — ABNORMAL HIGH (ref 1.7–7.7)
Neutrophils Relative %: 68 %
Platelets: 170 10*3/uL (ref 150–400)
RBC: 5.57 MIL/uL (ref 4.22–5.81)
RDW: 15.7 % — ABNORMAL HIGH (ref 11.5–15.5)
WBC: 15.8 10*3/uL — ABNORMAL HIGH (ref 4.0–10.5)
nRBC: 0 % (ref 0.0–0.2)

## 2021-09-12 LAB — BASIC METABOLIC PANEL
Anion gap: 7 (ref 5–15)
Anion gap: 7 (ref 5–15)
BUN: 42 mg/dL — ABNORMAL HIGH (ref 6–20)
BUN: 44 mg/dL — ABNORMAL HIGH (ref 6–20)
CO2: 25 mmol/L (ref 22–32)
CO2: 25 mmol/L (ref 22–32)
Calcium: 14.7 mg/dL (ref 8.9–10.3)
Calcium: 13.7 mg/dL (ref 8.9–10.3)
Chloride: 99 mmol/L (ref 98–111)
Chloride: 102 mmol/L (ref 98–111)
Creatinine, Ser: 1.9 mg/dL — ABNORMAL HIGH (ref 0.61–1.24)
Creatinine, Ser: 1.83 mg/dL — ABNORMAL HIGH (ref 0.61–1.24)
GFR, Estimated: 40 mL/min — ABNORMAL LOW (ref >60)
GFR, Estimated: 42 mL/min — ABNORMAL LOW (ref >60)
Glucose, Bld: 92 mg/dL (ref 70–99)
Glucose, Bld: 105 mg/dL — ABNORMAL HIGH (ref 70–99)
Potassium: 4.9 mmol/L (ref 3.5–5.1)
Potassium: 4.9 mmol/L (ref 3.5–5.1)
Sodium: 131 mmol/L — ABNORMAL LOW (ref 135–145)
Sodium: 134 mmol/L — ABNORMAL LOW (ref 135–145)

## 2021-09-12 LAB — URINALYSIS, ROUTINE W REFLEX MICROSCOPIC
Bilirubin Urine: NEGATIVE
Glucose, UA: NEGATIVE mg/dL
Hgb urine dipstick: NEGATIVE
Ketones, ur: NEGATIVE mg/dL
Leukocytes,Ua: NEGATIVE
Nitrite: NEGATIVE
Protein, ur: NEGATIVE mg/dL
Specific Gravity, Urine: 1.032 — ABNORMAL HIGH (ref 1.005–1.030)
pH: 5 (ref 5.0–8.0)

## 2021-09-12 LAB — PHOSPHORUS: Phosphorus: 5.2 mg/dL — ABNORMAL HIGH (ref 2.5–4.6)

## 2021-09-12 LAB — MAGNESIUM: Magnesium: 2 mg/dL (ref 1.7–2.4)

## 2021-09-12 LAB — CK: Total CK: 21 U/L — ABNORMAL LOW (ref 49–397)

## 2021-09-12 MED ORDER — ACETAMINOPHEN 650 MG RE SUPP: 650.0000 mg | Freq: Four times a day (QID) | RECTAL | Status: DC | PRN

## 2021-09-12 MED ORDER — SODIUM CHLORIDE 0.9 % IV BOLUS
1000.0000 mL | Freq: Once | INTRAVENOUS | Status: AC
  Administered 2021-09-12: 1000 mL via INTRAVENOUS

## 2021-09-12 MED ORDER — HYDROMORPHONE HCL 1 MG/ML IJ SOLN
1.0000 mg | INTRAMUSCULAR | Status: DC | PRN
  Administered 2021-09-12 – 2021-09-15 (×8): 1 mg via INTRAVENOUS
  Filled 2021-09-12 (×8): qty 1

## 2021-09-12 MED ORDER — ZOLEDRONIC ACID 5 MG/100ML IV SOLN
5.0000 mg | Freq: Once | INTRAVENOUS | Status: AC
  Administered 2021-09-12: 5 mg via INTRAVENOUS
  Filled 2021-09-12 (×2): qty 100

## 2021-09-12 MED ORDER — CALCITONIN (SALMON) 200 UNIT/ML IJ SOLN
400.0000 [IU] | Freq: Two times a day (BID) | INTRAMUSCULAR | Status: DC
  Administered 2021-09-12: 400 [IU] via SUBCUTANEOUS
  Filled 2021-09-12 (×2): qty 2

## 2021-09-12 MED ORDER — ONDANSETRON HCL 4 MG/2ML IJ SOLN
4.0000 mg | Freq: Four times a day (QID) | INTRAMUSCULAR | Status: DC | PRN
  Administered 2021-09-12 – 2021-09-14 (×3): 4 mg via INTRAVENOUS
  Filled 2021-09-12 (×4): qty 2

## 2021-09-12 MED ORDER — ONDANSETRON HCL 4 MG/2ML IJ SOLN
4.0000 mg | Freq: Once | INTRAMUSCULAR | Status: AC
  Administered 2021-09-12: 4 mg via INTRAVENOUS
  Filled 2021-09-12: qty 2

## 2021-09-12 MED ORDER — ONDANSETRON HCL 4 MG PO TABS: 4.0000 mg | ORAL_TABLET | Freq: Four times a day (QID) | ORAL | Status: DC | PRN

## 2021-09-12 MED ORDER — MORPHINE SULFATE (PF) 4 MG/ML IV SOLN
4.0000 mg | Freq: Once | INTRAVENOUS | Status: AC
  Administered 2021-09-12: 4 mg via INTRAVENOUS
  Filled 2021-09-12: qty 1

## 2021-09-12 MED ORDER — SODIUM CHLORIDE 0.9 % IV SOLN: INTRAVENOUS | Status: DC

## 2021-09-12 MED ORDER — ALLOPURINOL 300 MG PO TABS
300.0000 mg | ORAL_TABLET | Freq: Every day | ORAL | Status: DC
  Administered 2021-09-13 – 2021-09-15 (×3): 300 mg via ORAL
  Filled 2021-09-12 (×3): qty 1

## 2021-09-12 MED ORDER — ACETAMINOPHEN 325 MG PO TABS
650.0000 mg | ORAL_TABLET | Freq: Four times a day (QID) | ORAL | Status: DC | PRN
  Administered 2021-09-13 – 2021-09-14 (×2): 650 mg via ORAL
  Filled 2021-09-12 (×2): qty 2

## 2021-09-12 MED ORDER — ENOXAPARIN SODIUM 40 MG/0.4ML IJ SOSY
40.0000 mg | PREFILLED_SYRINGE | INTRAMUSCULAR | Status: DC
  Administered 2021-09-12 – 2021-09-14 (×3): 40 mg via SUBCUTANEOUS
  Filled 2021-09-12 (×3): qty 0.4

## 2021-09-12 MED ORDER — SODIUM CHLORIDE 0.9 % IV SOLN
4.0000 mg | Freq: Once | INTRAVENOUS | Status: DC
  Filled 2021-09-12 (×2): qty 5

## 2021-09-12 NOTE — ED Notes (Signed)
Handoff report given to [facility] 

## 2021-09-12 NOTE — ED Notes (Signed)
Pt placed on 2L [place] due to O2 saturation dropping to 88-89% while sleeping

## 2021-09-12 NOTE — ED Notes (Signed)
Report given to [employer] on 4W at [facility]

## 2021-09-12 NOTE — ED Notes (Signed)
Pt ambulated to restroom. 

## 2021-09-12 NOTE — H&P (Signed)
History and Physical    Patient: Thomas Waller SAY:[id number] DOB: 09-25-1960 DOA: 09/11/2021 DOS: the patient was seen and examined on 09/12/2021 PCP: Unk Pinto, MD  Patient coming from: Home  Chief Complaint: No chief complaint on file.  HPI: Thomas Waller is a 61 y.o. male with medical history significant of Reiter's polyarthritis, rheumatoid arthritis, cholelithiasis, glucose intolerance, gout, hyperlipidemia, hypertension, class II obesity who is coming to the emergency department due to fatigue, decreased appetite, nausea, emesis twice a day, chills, sweats since 9 days ago.  He has been having lower abdominal pain bilaterally.  He took multiple home COVID tests that were negative.  Recently, he has had unexplained weight loss of at least 20 pounds. He denied rhinorrhea, sore throat, wheezing or hemoptysis.  No chest pain, palpitations, diaphoresis, PND, orthopnea or pitting edema of the lower extremities.  No diarrhea, constipation, melena or hematochezia.  No flank pain, dysuria, frequency or hematuria.  No polyuria, polydipsia, polyphagia or blurred vision.   ED course: Initial vital signs were temperature 98 F, pulse 94, respirations 17, BP 122/79 mmHg O2 sat 94% on room air.  The patient received 2000 mL of normal saline bolus, morphine 4 mg IVP, ketorolac 30 mg IVP ondansetron 4 mg IVP and IV contrast for CT scan x2.  Lab work: CBC is her white count 15.4, hemoglobin 16.4 g/dL and platelets 193.  CMP showed a sodium 133, potassium 4.8, chloride 95 and CO2 32 mmol/L.  Glucose 85, BUN 39, creatinine 1.68 and calcium 16.9 mg/dL.  Total protein 6.1 g/dL.  AST 45, ALT 55 and ALT 177.  Normal albumin and bilirubin level.  Imaging: Two-view chest radiograph with lingular and right basilar opacities likely atelectasis.  CT abdomen/pelvis with contrast showed extensive intra-abdominal and retroperitoneal adenopathy.  CT chest with contrast extensive areas of lymphadenopathy of the neck,  chest and visualized portions of the abdomen.  There is moderate severity bilateral linear atelectasis with small pleural effusions   Review of Systems: As mentioned in the history of present illness. All other systems reviewed and are negative. Past Medical History:  Diagnosis Date   Arthritis    Reiters Polyarthritis   Cholelithiases 02/04/2018   Per Abd [place] 01/2018 with fatty liver, no inflammation or obstruction   Elevated hemoglobin A1c measurement    Gout    Hyperlipidemia    Hypertension    Rheumatoid arthritis [facility])    Past Surgical History:  Procedure Laterality Date   CATARACT EXTRACTION Right 04/2020   Dr. Tommy Rainwater   CATARACT EXTRACTION Left 05/2020   Dr. Tommy Rainwater   WISDOM TOOTH EXTRACTION     Social History:  reports that he has never smoked. He has never used smokeless tobacco. He reports current alcohol use. He reports that he does not use drugs.  Allergies  Allergen Reactions   Lisinopril Cough    Family History  Problem Relation Age of Onset   Heart attack Half-Brother     Prior to Admission medications   Medication Sig Start Date End Date Taking? Authorizing Provider  allopurinol (ZYLOPRIM) 300 MG tablet Take 300 mg by mouth daily.     [provider]  aspirin EC 81 MG tablet Take 81 mg by mouth daily.    [provider]  azithromycin (ZITHROMAX) 250 MG tablet Take 2 tablets (500 mg) on Day 1 followed by 1 tablet  (250 mg) daily until complete. 09/08/21   Darrol Jump, NP  Cholecalciferol (VITAMIN D PO) Take 5,000 Units by mouth 2 (  two) times daily.    [provider]  ENBREL SURECLICK 50 MG/ML injection  10/13/13   [provider]  fluocinonide ointment (LIDEX) 0.05 %  10/06/15   [provider]  losartan (COZAAR) 50 MG tablet TAKE 1 TABLET AT NIGHT FOR BLOOD PRESSURE 12/12/20   Unk Pinto, MD  Magnesium 500 MG TABS Take 1 tablet by mouth daily.    [provider]  Multiple Vitamin (MULTIVITAMIN)  tablet Take 1 tablet by mouth daily.    [provider]  naproxen (NAPROSYN) 500 MG tablet Take 500 mg by mouth 2 (two) times daily with a meal.    [provider]  Omega-3 Fatty Acids (FISH OIL) 1200 MG CAPS Take by mouth 2 (two) times daily.    [provider]  rosuvastatin (CRESTOR) 20 MG tablet Take 1 tablet (20 mg total) by mouth daily. for cholesterol 05/04/21   Alycia Rossetti, NP  SYRINGE-NEEDLE, DISP, 3 ML (B-D 3CC LUER-LOK SYR 21GX1") 21G X 1" 3 ML MISC Use 1 syringe with injection 06/14/21   Alycia Rossetti, NP  testosterone cypionate (DEPOTESTOSTERONE CYPIONATE) 200 MG/ML injection INJECT 1 ML INTRAMUSCULARLY EVERY 7 DAYS. DISCARD THE REMAINDER. 06/14/21   Alycia Rossetti, NP  traMADol Veatrice Bourbon) 50 MG tablet Take by mouth every 6 (six) hours as needed.    [provider]  vitamin B-12 (CYANOCOBALAMIN) 1000 MCG tablet Take 1,000 mcg by mouth daily.    [provider]  vitamin C (ASCORBIC ACID) 500 MG tablet Take 500 mg by mouth 2 (two) times daily.    [provider]  Zinc 50 MG TABS Take 1 tablet by mouth daily.    [provider]    Physical Exam: Vitals:   09/12/21 1045 09/12/21 1118 09/12/21 1245 09/12/21 1258  BP: (!) 161/82  135/76   Pulse: 90  87   Resp: 15  16   Temp:  98 F (36.7 C) 97.6 F (36.4 C)   TempSrc:  Oral Oral   SpO2: 94%  91%   Weight:    114.5 kg  Height:    '5\' 10"'$  (1.778 m)   Physical Exam Vitals and nursing note reviewed.  Constitutional:      General: He is awake. He is not in acute distress.    Appearance: He is obese. He is ill-appearing. He is not toxic-appearing or diaphoretic.  HENT:     Head: Normocephalic.     Nose: No rhinorrhea.     Mouth/Throat:     Mouth: Mucous membranes are moist.  Eyes:     General: No scleral icterus.    Pupils: Pupils are equal, round, and reactive to light.  Neck:     Vascular: No JVD.  Cardiovascular:     Rate and Rhythm: Normal rate and  regular rhythm.     Heart sounds: S1 normal and S2 normal.  Pulmonary:     Effort: Pulmonary effort is normal. No respiratory distress.     Breath sounds: Normal breath sounds. No wheezing, rhonchi or rales.  Abdominal:     General: Bowel sounds are normal. There is no distension.     Palpations: Abdomen is soft.     Tenderness: There is abdominal tenderness in the right lower quadrant and left lower quadrant. There is right CVA tenderness and left CVA tenderness. There is no guarding or rebound.  Musculoskeletal:     Cervical back: Neck supple.     Right lower leg: No edema.  Left lower leg: No edema.  Skin:    General: Skin is warm and dry.  Neurological:     General: No focal deficit present.     Mental Status: He is alert and oriented to person, place, and time.  Psychiatric:        Mood and Affect: Mood normal.        Behavior: Behavior normal. Behavior is cooperative.     Data Reviewed:  Results are pending, will review when available.  Assessment and Plan: Principal Problem:   Hypercalcemia of malignancy Admit to PCU/inpatient. Continue IV fluids. Continue SQ calcitonin 400 units twice daily. Monitor intake and output. Monitor renal function and calcium level. Interventional radiology consulted for biopsy. Will consult oncology before discharge.  Active Problems:   AKI (acute kidney injury) ([place]) Superimposed on:   CKD (chronic kidney disease) stage 3, GFR 30-59 ml/min (HCC) Received IV contrast twice. Continue IV fluids. Hold ARB/ACE. Avoid hypotension. Avoid nephrotoxins. Monitor intake and output. Check total CK level. Monitor renal function electrolytes.    Hypertension Blood pressure normal. Hold losartan. Monitor blood pressure. Avoid as needed hydralazine.    Hyperlipidemia, mixed Hold rosuvastatin pending CK result.    Idiopathic gout Continue allopurinol 300 mg p.o. daily.    Class 2 obesity BMI 36.22 kg/m. Lifestyle  modifications.     Advance Care Planning:   Code Status: Full Code   Consults: Interventional radiology (Dr. Kathlene Cote).  Family Communication:   Severity of Illness: The appropriate patient status for this patient is INPATIENT. Inpatient status is judged to be reasonable and necessary in order to provide the required intensity of service to ensure the patient's safety. The patient's presenting symptoms, physical exam findings, and initial radiographic and laboratory data in the context of their chronic comorbidities is felt to place them at high risk for further clinical deterioration. Furthermore, it is not anticipated that the patient will be medically stable for discharge from the hospital within 2 midnights of admission.   * I certify that at the point of admission it is my clinical judgment that the patient will require inpatient hospital care spanning beyond 2 midnights from the point of admission due to high intensity of service, high risk for further deterioration and high frequency of surveillance required.*  Author: Reubin Milan, MD 09/12/2021 1:03 PM  For on call review [email or web address].   This document was prepared using Dragon voice recognition software and may contain some unintended transcription errors.

## 2021-09-13 ENCOUNTER — Encounter (HOSPITAL_COMMUNITY): Payer: Self-pay | Admitting: Internal Medicine

## 2021-09-13 ENCOUNTER — Inpatient Hospital Stay (HOSPITAL_COMMUNITY): Payer: BC Managed Care – PPO

## 2021-09-13 DIAGNOSIS — I2510 Atherosclerotic heart disease of native coronary artery without angina pectoris: Secondary | ICD-10-CM

## 2021-09-13 LAB — COMPREHENSIVE METABOLIC PANEL
ALT: 52 U/L — ABNORMAL HIGH (ref 0–44)
AST: 46 U/L — ABNORMAL HIGH (ref 15–41)
Albumin: 2.7 g/dL — ABNORMAL LOW (ref 3.5–5.0)
Alkaline Phosphatase: 176 U/L — ABNORMAL HIGH (ref 38–126)
Anion gap: 5 (ref 5–15)
BUN: 46 mg/dL — ABNORMAL HIGH (ref 6–20)
CO2: 25 mmol/L (ref 22–32)
Calcium: 12.7 mg/dL — ABNORMAL HIGH (ref 8.9–10.3)
Chloride: 104 mmol/L (ref 98–111)
Creatinine, Ser: 1.91 mg/dL — ABNORMAL HIGH (ref 0.61–1.24)
GFR, Estimated: 40 mL/min — ABNORMAL LOW (ref >60)
Glucose, Bld: 102 mg/dL — ABNORMAL HIGH (ref 70–99)
Potassium: 5 mmol/L (ref 3.5–5.1)
Sodium: 134 mmol/L — ABNORMAL LOW (ref 135–145)
Total Bilirubin: 0.8 mg/dL (ref 0.3–1.2)
Total Protein: 5.4 g/dL — ABNORMAL LOW (ref 6.5–8.1)

## 2021-09-13 LAB — ECHOCARDIOGRAM COMPLETE
AR max vel: 2.67 cm2
AV Area VTI: 2.77 cm2
AV Area mean vel: 2.65 cm2
AV Mean grad: 9 mmHg
AV Peak grad: 14.1 mmHg
Ao pk vel: 1.88 m/s
Area-P 1/2: 3.42 cm2
Height: 70 in
S' Lateral: 2.9 cm
Weight: 4038.4 oz

## 2021-09-13 LAB — BASIC METABOLIC PANEL
Anion gap: 5 (ref 5–15)
BUN: 41 mg/dL — ABNORMAL HIGH (ref 6–20)
CO2: 29 mmol/L (ref 22–32)
Calcium: >15 mg/dL (ref 8.9–10.3)
Chloride: 97 mmol/L — ABNORMAL LOW (ref 98–111)
Creatinine, Ser: 1.73 mg/dL — ABNORMAL HIGH (ref 0.61–1.24)
GFR, Estimated: 45 mL/min — ABNORMAL LOW (ref >60)
Glucose, Bld: 109 mg/dL — ABNORMAL HIGH (ref 70–99)
Potassium: 4.8 mmol/L (ref 3.5–5.1)
Sodium: 131 mmol/L — ABNORMAL LOW (ref 135–145)

## 2021-09-13 LAB — CBC
HCT: 46.2 % (ref 39.0–52.0)
Hemoglobin: 15.4 g/dL (ref 13.0–17.0)
MCH: 30.1 pg (ref 26.0–34.0)
MCHC: 33.3 g/dL (ref 30.0–36.0)
MCV: 90.2 fL (ref 80.0–100.0)
Platelets: 163 10*3/uL (ref 150–400)
RBC: 5.12 MIL/uL (ref 4.22–5.81)
RDW: 15.7 % — ABNORMAL HIGH (ref 11.5–15.5)
WBC: 16.1 10*3/uL — ABNORMAL HIGH (ref 4.0–10.5)
nRBC: 0 % (ref 0.0–0.2)

## 2021-09-13 LAB — HIV ANTIBODY (ROUTINE TESTING W REFLEX): HIV Screen 4th Generation wRfx: NONREACTIVE

## 2021-09-13 LAB — PATHOLOGIST SMEAR REVIEW

## 2021-09-13 LAB — SODIUM, URINE, RANDOM: Sodium, Ur: <10 mmol/L

## 2021-09-13 MED ORDER — LIDOCAINE HCL 1 % IJ SOLN
INTRAMUSCULAR | Status: AC
  Administered 2021-09-13: 10 mL
  Filled 2021-09-13: qty 20

## 2021-09-13 MED ORDER — POLYETHYLENE GLYCOL 3350 17 G PO PACK
17.0000 g | PACK | Freq: Every day | ORAL | Status: DC
  Administered 2021-09-13 – 2021-09-15 (×3): 17 g via ORAL
  Filled 2021-09-13 (×3): qty 1

## 2021-09-13 NOTE — Procedures (Addendum)
Vascular and Interventional Radiology Procedure Note  Patient: Thomas Waller DOB: Jun 11, 1960 Medical Record Number: [id number] Note Date/Time: 09/13/21 2:16 PM   Performing Physician: Michaelle Birks, MD Assistant(s): None  Diagnosis: Adenopathy. No malignant Dx  Procedure: RIGHT INGUINAL LYMPH NODE BIOPSY  Anesthesia: Local Anesthetic Complications: None Estimated Blood Loss: Minimal Specimens: Sent for Pathology  Findings:  Successful Ultrasound-guided biopsy of R inguinal LN. A total of 3 samples were obtained. Hemostasis of the tract was achieved using Manual Pressure.  Plan: Bed rest for 1 hours.  See detailed procedure note with images in PACS. The patient tolerated the procedure well without incident or complication and was returned to Floor Bed in stable condition.    Michaelle Birks, MD [facility] Radiology   Pager. [place]

## 2021-09-13 NOTE — Consult Note (Addendum)
[place]  [phone] [phone]   MEDICAL ONCOLOGY - INITIAL CONSULTATION  Referral MD: Dr. Shelly Coss  Reason for Referral: Extensive lymphadenopathy, hypercalcemia  HPI: [name] is a 61 year old male with a past medical history significant for Reiter's polyarthritis, RA, cholelithiasis, gout, hyperlipidemia, hypertension.  He presented to the emergency department due to worsening fatigue, poor p.o. intake, nausea, vomiting, chills, and sweats for about 9 days.  Reported unexplained weight loss of about 20 pounds.  Initial lab work showed WBC of 15.8, ANC 10.7, absolute monocytes of 3.4, sodium 131, BUN 42, creatinine 1.9, calcium 14.7.  Of note, patient had hypercalcemia noted on lab work starting on 09/08/2021.  CT chest with contrast showed extensive areas of lymphadenopathy in the neck, chest, and visualized portion of the abdomen.  CT abdomen/pelvis with contrast showed extensive intra-abdominal and retroperitoneal adenopathy.  He was started on IV fluids and subcu calcitonin for the hypercalcemia.  Received a dose of zoledronic acid on 09/12/2021.  Corrected calcium is 13.3 today. IR has been consulted for biopsy of one of his lymph nodes.  The patient was seen today in his hospital room.  No family at the bedside.  He states that he has been experiencing fatigue and weakness for about the past 10 to 14 days.  He also states that he has hot/cold spells.  However, he has not measured any fever.  He states that he feels "sweaty" but that he has not specifically having night sweats.  Reports some abdominal discomfort and has had nausea and vomiting prior to admission.  He is not having any headaches or dizziness.  He denies chest pain or shortness of breath.  No bleeding reported.  The patient is married and has no children.  Denies history of alcohol and tobacco use.  Denies family history of malignancy. Medical Oncology was asked to see the patient to  make recommendations regarding his lymphadenopathy and hypercalcemia.    Past Medical History:  Diagnosis Date   Arthritis    Reiters Polyarthritis   Cholelithiases 02/04/2018   Per Abd [place] 01/2018 with fatty liver, no inflammation or obstruction   Elevated hemoglobin A1c measurement    Gout    Hyperlipidemia    Hypertension    Rheumatoid arthritis ([place])   :   Past Surgical History:  Procedure Laterality Date   CATARACT EXTRACTION Right 04/2020   Dr. Tommy Rainwater   CATARACT EXTRACTION Left 05/2020   Dr. Tommy Rainwater   WISDOM TOOTH EXTRACTION    :   Current Facility-Administered Medications  Medication Dose Route Frequency Provider Last Rate Last Admin   0.9 %  sodium chloride infusion   Intravenous Continuous Reubin Milan, MD 125 mL/hr at 09/12/21 1540 New Bag at 09/12/21 1540   acetaminophen (TYLENOL) tablet 650 mg  650 mg Oral Q6H PRN Reubin Milan, MD   650 mg at 09/13/21 4656   Or   acetaminophen (TYLENOL) suppository 650 mg  650 mg Rectal Q6H PRN Reubin Milan, MD       allopurinol (ZYLOPRIM) tablet 300 mg  300 mg Oral Daily Reubin Milan, MD       aspirin EC tablet 81 mg  81 mg Oral Daily Reubin Milan, MD   81 mg at 09/12/21 1008   calcitonin (MIACALCIN) injection 400 Units  400 Units Subcutaneous BID Reubin Milan, MD   400 Units at 09/12/21 2202   enoxaparin (LOVENOX) injection 40 mg  40 mg Subcutaneous  Q24H Reubin Milan, MD   40 mg at 09/12/21 2202   HYDROmorphone (DILAUDID) injection 1 mg  1 mg Intravenous Q4H PRN Reubin Milan, MD   1 mg at 09/13/21 0723   ondansetron [facility]) tablet 4 mg  4 mg Oral Q6H PRN Reubin Milan, MD       Or   ondansetron [facility]) injection 4 mg  4 mg Intravenous Q6H PRN Reubin Milan, MD   4 mg at 09/13/21 6734   traMADol (ULTRAM) tablet 50 mg  50 mg Oral Q6H PRN Reubin Milan, MD   50 mg at 09/12/21 1337      Allergies  Allergen Reactions   Zestril [Lisinopril] Cough   :   Family History  Problem Relation Age of Onset   Heart attack Half-Brother   :   Social History   Socioeconomic History   Marital status: Married    Spouse name: Not on file   Number of children: Not on file   Years of education: Not on file   Highest education level: Not on file  Occupational History   Not on file  Tobacco Use   Smoking status: Never   Smokeless tobacco: Never  Vaping Use   Vaping Use: Never used  Substance and Sexual Activity   Alcohol use: Yes    Comment: rarely   Drug use: No   Sexual activity: Yes    Partners: Female    Birth control/protection: None  Other Topics Concern   Not on file  Social History Narrative   Not on file   Social Determinants of Health   Financial Resource Strain: Not on file  Food Insecurity: Not on file  Transportation Needs: Not on file  Physical Activity: Not on file  Stress: Not on file  Social Connections: Not on file  Intimate Partner Violence: Not on file  :  Review of Systems: A comprehensive 14 point review of systems was negative except as noted in the HPI.  Exam: Patient Vitals for the past 24 hrs:  BP Temp Temp src Pulse Resp SpO2 Height Weight  09/13/21 0733 -- -- -- -- -- 94 % -- --  09/13/21 0730 -- -- -- -- -- (!) 85 % -- --  09/13/21 0634 -- -- -- -- -- 94 % -- --  09/13/21 0542 104/66 98.2 F (36.8 C) -- 89 16 (!) 89 % -- --  09/12/21 2003 132/80 98.2 F (36.8 C) -- 90 15 (!) 89 % -- --  09/12/21 1258 -- -- -- -- -- -- '5\' 10"'$  (1.778 m) 114.5 kg  09/12/21 1245 135/76 97.6 F (36.4 C) Oral 87 16 91 % -- --  09/12/21 1118 -- 98 F (36.7 C) Oral -- -- -- -- --  09/12/21 1045 (!) 161/82 -- -- 90 15 94 % -- --  09/12/21 1030 (!) 153/82 -- -- 87 16 91 % -- --  09/12/21 1015 (!) 144/85 -- -- 88 15 94 % -- --  09/12/21 1000 (!) 155/88 -- -- 92 15 92 % -- --  09/12/21 0945 (!) 148/81 -- -- 91 16 93 % -- --  09/12/21 0930 (!) 150/77 -- -- 90 15 95 % -- --  09/12/21 0915 (!) 148/84 -- -- 91 15  93 % -- --    General: Laying in bed, no distress. Eyes:  no scleral icterus.   ENT:  There were no oropharyngeal lesions.   Lymphatics: Palpable right cervical, bilateral axillary, and right  inguinal adenopathy Respiratory: lungs were clear bilaterally without wheezing or crackles.   Cardiovascular: Regular rate and rhythm, no lower extremity edema.   GI: Positive bowel sounds, mildly distended, nontender. Skin exam was without echymosis, petichae.   Neuro exam was nonfocal. Patient was alert and oriented.    Lab Results  Component Value Date   WBC 16.1 (H) 09/13/2021   HGB 15.4 09/13/2021   HCT 46.2 09/13/2021   PLT 163 09/13/2021   GLUCOSE 102 (H) 09/13/2021   CHOL 145 08/08/2021   TRIG 135 08/08/2021   HDL 39 (L) 08/08/2021   LDLCALC 83 08/08/2021   ALT 52 (H) 09/13/2021   AST 46 (H) 09/13/2021   NA 134 (L) 09/13/2021   K 5.0 09/13/2021   CL 104 09/13/2021   CREATININE 1.91 (H) 09/13/2021   BUN 46 (H) 09/13/2021   CO2 25 09/13/2021    CT Head Wo Contrast  Result Date: 09/12/2021 CLINICAL DATA:  Vomiting and lethargy. EXAM: CT HEAD WITHOUT CONTRAST TECHNIQUE: Contiguous axial images were obtained from the base of the skull through the vertex without intravenous contrast. RADIATION DOSE REDUCTION: This exam was performed according to the departmental dose-optimization program which includes automated exposure control, adjustment of the mA and/or kV according to patient size and/or use of iterative reconstruction technique. COMPARISON:  None Available. FINDINGS: Brain: No evidence of acute infarction, hemorrhage, hydrocephalus, extra-axial collection or mass lesion/mass effect. Vascular: No hyperdense vessel or unexpected calcification. Skull: Normal. Negative for fracture or focal lesion. Sinuses/Orbits: No acute finding. Other: None. IMPRESSION: No acute intracranial pathology. Electronically Signed   By: Virgina Norfolk M.D.   On: 09/12/2021 00:40   CT Chest W  Contrast  Result Date: 09/12/2021 CLINICAL DATA:  Right pleural effusion and possible lymphoma seen on earlier abdomen and pelvis CT. EXAM: CT CHEST WITH CONTRAST TECHNIQUE: Multidetector CT imaging of the chest was performed during intravenous contrast administration. RADIATION DOSE REDUCTION: This exam was performed according to the departmental dose-optimization program which includes automated exposure control, adjustment of the mA and/or kV according to patient size and/or use of iterative reconstruction technique. CONTRAST:  75m OMNIPAQUE IOHEXOL 300 MG/ML  SOLN COMPARISON:  None Available. FINDINGS: Cardiovascular: It should be noted that evaluation of the vascular structures is markedly limited secondary to suboptimal opacification with intravenous contrast. There is mild to moderate severity calcification of the aortic arch, without evidence of aortic aneurysm. Normal heart size with marked severity coronary artery calcification. No pericardial effusion. Mediastinum/Nodes: Numerous enlarged pretracheal and bilateral axillary lymph nodes are seen. The largest pretracheal lymph node measures approximately 2.7 cm x 1.5 cm (axial CT image 33, CT series 2). Numerous smaller AP window, subcarinal and bilateral hilar lymph nodes are noted (the largest is seen within the region of the AP window and measures 1.3 cm x 0.9 cm. Enlarged lymph nodes are also seen within the visualized neck soft tissues, posterior to the proximal portions of the bilateral clavicles (the largest measures 2.8 cm x 1.7 cm/axial CT image 5, CT series 2). Thyroid gland, trachea, and esophagus demonstrate no significant findings. Lungs/Pleura: Moderate severity inferior right upper lobe, right middle lobe, lingular and bilateral lower lobe linear atelectasis is noted. There are small bilateral pleural effusions, right slightly greater than left. No pneumothorax is identified. Upper Abdomen: Numerous enlarged mesenteric lymph nodes are seen  within the visualized portion of the upper abdomen. Musculoskeletal: Multilevel degenerative changes seen throughout the thoracic spine. IMPRESSION: 1. Extensive areas of lymphadenopathy within the neck, chest  and visualized portion of the abdomen, consistent with a lymphoproliferative disorder. Correlation with tissue sampling is recommended. 2. Moderate severity bilateral linear atelectasis with small bilateral pleural effusions, right slightly greater than left. 3. Marked severity coronary artery disease. Aortic Atherosclerosis (ICD10-I70.0). Electronically Signed   By: Virgina Norfolk M.D.   On: 09/12/2021 00:39   CT ABDOMEN PELVIS W CONTRAST  Result Date: 09/11/2021 CLINICAL DATA:  Right-sided abdominal pain EXAM: CT ABDOMEN AND PELVIS WITH CONTRAST TECHNIQUE: Multidetector CT imaging of the abdomen and pelvis was performed using the standard protocol following bolus administration of intravenous contrast. RADIATION DOSE REDUCTION: This exam was performed according to the departmental dose-optimization program which includes automated exposure control, adjustment of the mA and/or kV according to patient size and/or use of iterative reconstruction technique. CONTRAST:  49m OMNIPAQUE IOHEXOL 300 MG/ML  SOLN COMPARISON:  02/04/2018. FINDINGS: Lower chest: Lung bases demonstrate mild atelectatic changes. Small right-sided pleural effusion is seen. Hepatobiliary: No focal liver abnormality is seen. No gallstones, gallbladder wall thickening, or biliary dilatation. Cholelithiasis seen on prior ultrasound is not well appreciated on this exam Pancreas: Pancreas appears within normal limits. Spleen: Normal in size without focal abnormality. Adrenals/Urinary Tract: Adrenal glands are within normal limits bilaterally. Kidneys demonstrate a normal enhancement pattern. No calculi or obstructive changes are seen. The bladder is partially distended. Stomach/Bowel: No obstructive or inflammatory changes of the colon are  seen. Scattered diverticular changes noted. The appendix is partially visualized. No definitive Peri appendiceal inflammatory changes to suggest appendicitis are noted. Vascular/Lymphatic: Vascular calcifications of the abdominal aorta are noted. Diffuse retroperitoneal and intra-abdominal adenopathy is identified. Index node in the portacaval space measures 3.3 cm in short axis. Multiple Peri aortic, pericaval and intra-aortocaval nodes are seen. Index node on image number 51 of series 2 measures 2.4 cm in short axis. Large iliac nodal masses are seen measuring 3.7 cm in short axis on the left and 2.5 cm in short axis on the right. Inguinal adenopathy is noted. Dominant node measures 3 cm in short axis on the left. Scattered smaller lymph nodes are seen. Reproductive: Prostate is unremarkable. Other: Mild free fluid is noted within the abdomen along the liver margin and scattered throughout the pelvis. Musculoskeletal: Degenerative changes of lumbar spine are noted. No acute bony abnormality is seen. IMPRESSION: Extensive intra-abdominal and retroperitoneal adenopathy as described. These changes are most consistent with lymphoma till proven otherwise. Tissue sampling is recommended for further evaluation. Be performed in the left inguinal region without difficulty. Appendix shows no findings to suggest appendicitis. Mild atelectatic changes with small right-sided pleural effusion. Previously seen gallstones are not well appreciated on this exam. Electronically Signed   By: MInez CatalinaM.D.   On: 09/11/2021 21:15   DG Chest 2 View  Result Date: 09/11/2021 CLINICAL DATA:  Weakness.  Fever. EXAM: CHEST - 2 VIEW COMPARISON:  None Available. FINDINGS: Right basilar and left lingular opacities, somewhat platelike. No pneumothorax. No nodules or masses. The cardiomediastinal silhouette is normal. IMPRESSION: Lingular and right basilar opacities. The platelike nature suggests the possibility of atelectasis. However,  given history of fever, pneumonia is not excluded. Recommend short-term follow-up to ensure resolution. Electronically Signed   By: DDorise BullionIII M.D.   On: 09/11/2021 16:51     CT Head Wo Contrast  Result Date: 09/12/2021 CLINICAL DATA:  Vomiting and lethargy. EXAM: CT HEAD WITHOUT CONTRAST TECHNIQUE: Contiguous axial images were obtained from the base of the skull through the vertex without intravenous contrast. RADIATION DOSE  REDUCTION: This exam was performed according to the departmental dose-optimization program which includes automated exposure control, adjustment of the mA and/or kV according to patient size and/or use of iterative reconstruction technique. COMPARISON:  None Available. FINDINGS: Brain: No evidence of acute infarction, hemorrhage, hydrocephalus, extra-axial collection or mass lesion/mass effect. Vascular: No hyperdense vessel or unexpected calcification. Skull: Normal. Negative for fracture or focal lesion. Sinuses/Orbits: No acute finding. Other: None. IMPRESSION: No acute intracranial pathology. Electronically Signed   By: Virgina Norfolk M.D.   On: 09/12/2021 00:40   CT Chest W Contrast  Result Date: 09/12/2021 CLINICAL DATA:  Right pleural effusion and possible lymphoma seen on earlier abdomen and pelvis CT. EXAM: CT CHEST WITH CONTRAST TECHNIQUE: Multidetector CT imaging of the chest was performed during intravenous contrast administration. RADIATION DOSE REDUCTION: This exam was performed according to the departmental dose-optimization program which includes automated exposure control, adjustment of the mA and/or kV according to patient size and/or use of iterative reconstruction technique. CONTRAST:  82m OMNIPAQUE IOHEXOL 300 MG/ML  SOLN COMPARISON:  None Available. FINDINGS: Cardiovascular: It should be noted that evaluation of the vascular structures is markedly limited secondary to suboptimal opacification with intravenous contrast. There is mild to moderate  severity calcification of the aortic arch, without evidence of aortic aneurysm. Normal heart size with marked severity coronary artery calcification. No pericardial effusion. Mediastinum/Nodes: Numerous enlarged pretracheal and bilateral axillary lymph nodes are seen. The largest pretracheal lymph node measures approximately 2.7 cm x 1.5 cm (axial CT image 33, CT series 2). Numerous smaller AP window, subcarinal and bilateral hilar lymph nodes are noted (the largest is seen within the region of the AP window and measures 1.3 cm x 0.9 cm. Enlarged lymph nodes are also seen within the visualized neck soft tissues, posterior to the proximal portions of the bilateral clavicles (the largest measures 2.8 cm x 1.7 cm/axial CT image 5, CT series 2). Thyroid gland, trachea, and esophagus demonstrate no significant findings. Lungs/Pleura: Moderate severity inferior right upper lobe, right middle lobe, lingular and bilateral lower lobe linear atelectasis is noted. There are small bilateral pleural effusions, right slightly greater than left. No pneumothorax is identified. Upper Abdomen: Numerous enlarged mesenteric lymph nodes are seen within the visualized portion of the upper abdomen. Musculoskeletal: Multilevel degenerative changes seen throughout the thoracic spine. IMPRESSION: 1. Extensive areas of lymphadenopathy within the neck, chest and visualized portion of the abdomen, consistent with a lymphoproliferative disorder. Correlation with tissue sampling is recommended. 2. Moderate severity bilateral linear atelectasis with small bilateral pleural effusions, right slightly greater than left. 3. Marked severity coronary artery disease. Aortic Atherosclerosis (ICD10-I70.0). Electronically Signed   By: TVirgina NorfolkM.D.   On: 09/12/2021 00:39   CT ABDOMEN PELVIS W CONTRAST  Result Date: 09/11/2021 CLINICAL DATA:  Right-sided abdominal pain EXAM: CT ABDOMEN AND PELVIS WITH CONTRAST TECHNIQUE: Multidetector CT imaging  of the abdomen and pelvis was performed using the standard protocol following bolus administration of intravenous contrast. RADIATION DOSE REDUCTION: This exam was performed according to the departmental dose-optimization program which includes automated exposure control, adjustment of the mA and/or kV according to patient size and/or use of iterative reconstruction technique. CONTRAST:  834mOMNIPAQUE IOHEXOL 300 MG/ML  SOLN COMPARISON:  02/04/2018. FINDINGS: Lower chest: Lung bases demonstrate mild atelectatic changes. Small right-sided pleural effusion is seen. Hepatobiliary: No focal liver abnormality is seen. No gallstones, gallbladder wall thickening, or biliary dilatation. Cholelithiasis seen on prior ultrasound is not well appreciated on this exam Pancreas: Pancreas appears  within normal limits. Spleen: Normal in size without focal abnormality. Adrenals/Urinary Tract: Adrenal glands are within normal limits bilaterally. Kidneys demonstrate a normal enhancement pattern. No calculi or obstructive changes are seen. The bladder is partially distended. Stomach/Bowel: No obstructive or inflammatory changes of the colon are seen. Scattered diverticular changes noted. The appendix is partially visualized. No definitive Peri appendiceal inflammatory changes to suggest appendicitis are noted. Vascular/Lymphatic: Vascular calcifications of the abdominal aorta are noted. Diffuse retroperitoneal and intra-abdominal adenopathy is identified. Index node in the portacaval space measures 3.3 cm in short axis. Multiple Peri aortic, pericaval and intra-aortocaval nodes are seen. Index node on image number 51 of series 2 measures 2.4 cm in short axis. Large iliac nodal masses are seen measuring 3.7 cm in short axis on the left and 2.5 cm in short axis on the right. Inguinal adenopathy is noted. Dominant node measures 3 cm in short axis on the left. Scattered smaller lymph nodes are seen. Reproductive: Prostate is unremarkable.  Other: Mild free fluid is noted within the abdomen along the liver margin and scattered throughout the pelvis. Musculoskeletal: Degenerative changes of lumbar spine are noted. No acute bony abnormality is seen. IMPRESSION: Extensive intra-abdominal and retroperitoneal adenopathy as described. These changes are most consistent with lymphoma till proven otherwise. Tissue sampling is recommended for further evaluation. Be performed in the left inguinal region without difficulty. Appendix shows no findings to suggest appendicitis. Mild atelectatic changes with small right-sided pleural effusion. Previously seen gallstones are not well appreciated on this exam. Electronically Signed   By: Inez Catalina M.D.   On: 09/11/2021 21:15   DG Chest 2 View  Result Date: 09/11/2021 CLINICAL DATA:  Weakness.  Fever. EXAM: CHEST - 2 VIEW COMPARISON:  None Available. FINDINGS: Right basilar and left lingular opacities, somewhat platelike. No pneumothorax. No nodules or masses. The cardiomediastinal silhouette is normal. IMPRESSION: Lingular and right basilar opacities. The platelike nature suggests the possibility of atelectasis. However, given history of fever, pneumonia is not excluded. Recommend short-term follow-up to ensure resolution. Electronically Signed   By: Dorise Bullion III M.D.   On: 09/11/2021 16:51    Assessment and Plan:  This is a 61 year old male with  1.  Widespread lymphadenopathy.  Discussed CT scan findings with the patient which are concerning for lymphoma.  Additionally, he has been experiencing fatigue, unintentional weight loss, and has hypercalcemia.  Recommend proceeding with an ultrasound-guided biopsy of one of his lymph nodes by IR.  Once we have the biopsy result, we can have further discussion regarding treatment options.  2.  Hypercalcemia.  Likely due to underlying malignancy.  Continue IV hydration.  He has already received 1 dose of zoledronic acid and remains on calcitonin.  Continue  to monitor calcium level closely.  Thank you for this referral.   Mikey Bussing, DNP, AGPCNP-BC, AOCNP   Patient seen and examined and agree with the detailed information above.  He presented with failure to thrive weight loss and found to have lymphadenopathy and hypercalcemia suspicious for lymphoid malignancy.  He underwent percutaneous biopsy which was completed this afternoon. Clinically, he reports overall generalized fatigue and anorexia but no fevers or chills or sweats.  History is limited at this time as he is recovering from anesthesia.  On physical exam no major palpable adenopathy noted.  No splenomegaly detected.  The differential diagnosis was reviewed at this time and management choices were discussed.  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  Other consideration will be  metastatic carcinoma or germ cell tumor with involvement into the lymph node a possibility as well.  Treatment options, prognosis as well as future plan of care are pending at this time till his biopsy results are completed.  For the time being I recommend continued supportive management and I do not have any objection to discharge prior to his biopsy results if he is well enough to be discharged.  Further recommendations will be given once his biopsy is completed.  60  minutes were dedicated to this visit.  50% of time was face-to-face.  The time was spent on reviewing laboratory data, imaging studies, discussing treatment options, discussing differential diagnosis and answering questions regarding future plan.

## 2021-09-13 NOTE — TOC Initial Note (Signed)
Transition of Care [facility]) - Initial/Assessment Note    Patient Details  Name: Thomas Waller MRN: [id number] Date of Birth: 1960-05-17  Transition [facility]) CM/SW Contact:    Roseanne Kaufman, RN Phone Number: 09/13/2021, 1:18 PM  Clinical Narrative:   Patient reports not having any DME prior to admission. PAtient's wife will transport at discharge. Pharmacy: [facility] on [address]                Expected Discharge Plan: Home/Self Care Barriers to Discharge: Continued Medical Work up   Patient Goals and CMS Choice Patient states their goals for this hospitalization and ongoing recovery are:: fell better   Choice offered to / list presented to : NA  Expected Discharge Plan and Services Expected Discharge Plan: Home/Self Care In-house Referral: NA Discharge Planning Services: CM Consult Post Acute Care Choice: NA Living arrangements for the past 2 months: Single Family Home                 DME Arranged: N/A DME Agency: NA       HH Arranged: NA HH Agency: NA        Prior Living Arrangements/Services Living arrangements for the past 2 months: Single Family Home Lives with:: Spouse Occupational psychologist) Patient language and need for interpreter reviewed:: Yes Do you feel safe going back to the place where you live?: Yes      Need for Family Participation in Patient Care: Yes (Comment) Care giver support system in place?: Yes (comment) Current home services: Other (comment) (Per Wife Marcie- none) Criminal Activity/Legal Involvement Pertinent to Current Situation/Hospitalization: No - Comment as needed  Activities of Daily Living Home Assistive Devices/Equipment: None ADL Screening (condition at time of admission) Patient's cognitive ability adequate to safely complete daily activities?: Yes Is the patient deaf or have difficulty hearing?: No Does the patient have difficulty seeing, even when wearing glasses/contacts?: No Does the patient have difficulty concentrating,  remembering, or making decisions?: No Patient able to express need for assistance with ADLs?: Yes Does the patient have difficulty dressing or bathing?: No Independently performs ADLs?: Yes (appropriate for developmental age) Does the patient have difficulty walking or climbing stairs?: No Weakness of Legs: None Weakness of Arms/Hands: None  Permission Sought/Granted Permission sought to share information with : Case Manager Permission granted to share information with : Yes, Verbal Permission Granted  Share Information with NAME: Case Manager           Emotional Assessment Appearance:: Appears stated age Attitude/Demeanor/Rapport: Gracious Affect (typically observed): Accepting Orientation: : Oriented to Place, Oriented to Self, Oriented to  Time, Oriented to Situation Alcohol / Substance Use: Alcohol Use Psych Involvement: No (comment)  Admission diagnosis:  Hypercalcemia [E83.52] Lymphadenopathy [R59.1] Hypercalcemia of malignancy [E83.52] Patient Active Problem List   Diagnosis Date Noted   Piriformis syndrome 09/12/2021   AKI (acute kidney injury) ([place]) 09/12/2021   Class 2 obesity 09/12/2021   Hypercalcemia of malignancy 09/11/2021   CKD (chronic kidney disease) stage 3, GFR 30-59 ml/min (HCC) 12/01/2020   Deterioration in renal function 11/03/2020   Rheumatoid arthritis involving multiple sites ([place]) 11/05/2018   Cholelithiases 02/04/2018   Hypogonadism in male 07/01/2017   Fatty liver 05/22/2016   Morbid obesity ([place])- BMI 35+ with htn, hyperlipidemia 01/11/2014   Medication management 06/21/2013   Vitamin D deficiency 06/21/2013   Hypertension    Abnormal glucose    Hyperlipidemia, mixed    Arthritis    Idiopathic gout    PCP:  Unk Pinto, MD Pharmacy:   [facility], [place] [address] [place] [address] Phone: [phone] Fax: [phone]  [facility] [place] [place] [address] [place] [address] Phone: [phone] Fax: [place], [place] [address] [place] [address] Phone: [phone] Fax: [phone]  [place] [address] Phone: [phone] Fax: [phone]     Social Determinants of Health (SDOH) Interventions    Readmission Risk Interventions     No data to display

## 2021-09-13 NOTE — Progress Notes (Signed)
PROGRESS NOTE  Thomas Waller  WUJ:[id number] DOB: [birth year] DOA: 09/11/2021 PCP: Unk Pinto, MD   Brief Narrative:  Patient is a 61 year old male with history of rheumatoid arthritis, cholelithiasis, gout, hyperlipidemia, hypertension who presents to the emergency department with complaints of fatigue, decreased appetite, nausea, chills, bilateral lower abdominal pain, unexplained weight loss of 20 pounds.  He was hemodynamically stable on presentation.  Lab work showed WBC count of 15.4, creatinine of 1.68, calcium level of 16.9.  Chest x-ray showed lingular and right basilar opacities likely atelectasis.  CT abdomen/pelvis showed extensive intra-abdominal and retroperitoneal lymphadenopathy.  CT chest with contrast showed extensive areas of lymphadenopathy of the neck, chest, abdomen.  Patient suspected to have new onset lymphoma.  Admitted for the management of hypercalcemia of malignancy.  Oncology consulted.  Plan for lymph node biopsy.  Assessment & Plan:  Principal Problem:   Hypercalcemia of malignancy Active Problems:   Hypertension   Hyperlipidemia, mixed   Idiopathic gout   CKD (chronic kidney disease) stage 3, GFR 30-59 ml/min (HCC)   AKI (acute kidney injury) ([place])   Class 2 obesity   Hypercalcemia of malignancy: Presented with weakness, decreased appetite.  Calcium level found to be elevated, most likely secondary to lymphoma.  Started on IV fluids, calcitonin subcu.  Zometa  given one dose.  Calcium level rapidly improving.  Calcitonin has been discontinued now.  Check BMP tomorrow.  Suspected lymphoma: CT imagings as above.  IR consulted for biopsy of the lymph node.  We have consulted oncology.  AKI on CKD stage II: His baseline creatinine has fluctuated from 1.2-1.5 in the past.  Presented with creatinine in the range of 1.9.  Continue gentle IV fluids.  We will also check ultrasound of the kidney, urine sodium.  Patient declines any history of CKD.  Elevated  liver enzymes: Mild, most likely associated with lymphoma.  Continue to monitor  Leukocytosis: Most likely reactive or secondary to lymphoma.  Continue to monitor.  Hypertension: Currently normotensive.  Losartan on hold  Hyperlipidemia: On rosuvastatin  Idiopathic gout: On allopurinol  Obesity: BMI of 36.2          DVT prophylaxis:enoxaparin (LOVENOX) injection 40 mg Start: 09/12/21 2200     Code Status: Full Code  Family Communication: Called and discussed with wife on phone on 8/29  Patient status:Inpatient  Patient is from :Home  Anticipated discharge NF:AOZH  Estimated DC date:2-3 days, after full work-up, improvement in the hypercalcemia   Consultants: Oncology,IR  Procedures:  Antimicrobials:  Anti-infectives (From admission, onward)    None       Subjective: Patient seen and examined at the bedside this morning.  Remains very weak, foggy.  Alert and oriented .  Denies any chest pain or shortness of breath or abdominal pain.  Long discussion held with wife on phone at bedside.  Objective: Vitals:   09/13/21 0542 09/13/21 0634 09/13/21 0730 09/13/21 0733  BP: 104/66     Pulse: 89     Resp: 16     Temp: 98.2 F (36.8 C)     TempSrc:      SpO2: (!) 89% 94% (!) 85% 94%  Weight:      Height:        Intake/Output Summary (Last 24 hours) at 09/13/2021 0758 Last data filed at 09/13/2021 0649 Gross per 24 hour  Intake 1877.53 ml  Output 725 ml  Net 1152.53 ml   Filed Weights   09/12/21 1258  Weight: 114.5 kg  Examination:  General exam: Weak, obese HEENT: PERRL Respiratory system:  no wheezes or crackles  Cardiovascular system: S1 & S2 heard, RRR.  Gastrointestinal system: Abdomen is distended, soft and nontender. Central nervous system: Alert and oriented but very slow Extremities: No edema, no clubbing ,no cyanosis Skin: No rashes, no ulcers,no icterus     Data Reviewed: I have personally reviewed following labs and imaging  studies  CBC: Recent Labs  Lab 09/08/21 1221 09/11/21 1923 09/12/21 1322 09/13/21 0442  WBC 11.7* 15.4* 15.8* 16.1*  NEUTROABS 7,523 9.8* 10.7*  --   HGB 17.0 16.4 16.8 15.4  HCT 49.5 47.3 50.6 46.2  MCV 88.2 86.5 90.8 90.2  PLT 192 193 170 132   Basic Metabolic Panel: Recent Labs  Lab 09/11/21 1923 09/12/21 0037 09/12/21 1322 09/12/21 1846 09/13/21 0442  NA 133* 131* 131* 134* 134*  K 4.8 4.8 4.9 4.9 5.0  CL 95* 97* 99 102 104  CO2 32 '29 25 25 25  '$ GLUCOSE 85 109* 92 105* 102*  BUN 39* 41* 42* 44* 46*  CREATININE 1.68* 1.73* 1.90* 1.83* 1.91*  CALCIUM 16.9* 15.5* 14.7* 13.7* 12.7*  MG  --   --  2.0  --   --   PHOS  --   --  5.2*  --   --      Recent Results (from the past 240 hour(s))  Resp Panel by RT-PCR (Flu A&B, Covid) Anterior Nasal Swab     Status: None   Collection Time: 09/11/21  7:23 PM   Specimen: Anterior Nasal Swab  Result Value Ref Range Status   SARS Coronavirus 2 by RT PCR NEGATIVE NEGATIVE Final    Comment: (NOTE) SARS-CoV-2 target nucleic acids are NOT DETECTED.  The SARS-CoV-2 RNA is generally detectable in upper respiratory specimens during the acute phase of infection. The lowest concentration of SARS-CoV-2 viral copies this assay can detect is 138 copies/mL. A negative result does not preclude SARS-Cov-2 infection and should not be used as the sole basis for treatment or other patient management decisions. A negative result may occur with  improper specimen collection/handling, submission of specimen other than nasopharyngeal swab, presence of viral mutation(s) within the areas targeted by this assay, and inadequate number of viral copies(<138 copies/mL). A negative result must be combined with clinical observations, patient history, and epidemiological information. The expected result is Negative.  Fact Sheet for Patients:  EntrepreneurPulse.com.au  Fact Sheet for Healthcare Providers:   IncredibleEmployment.be  This test is no t yet approved or cleared by the [place] FDA and  has been authorized for detection and/or diagnosis of SARS-CoV-2 by FDA under an Emergency Use Authorization (EUA). This EUA will remain  in effect (meaning this test can be used) for the duration of the COVID-19 declaration under Section 564(b)(1) of the Act, 21 U.S.C.section 360bbb-3(b)(1), unless the authorization is terminated  or revoked sooner.       Influenza A by PCR NEGATIVE NEGATIVE Final   Influenza B by PCR NEGATIVE NEGATIVE Final    Comment: (NOTE) The Xpert Xpress SARS-CoV-2/FLU/RSV plus assay is intended as an aid in the diagnosis of influenza from Nasopharyngeal swab specimens and should not be used as a sole basis for treatment. Nasal washings and aspirates are unacceptable for Xpert Xpress SARS-CoV-2/FLU/RSV testing.  Fact Sheet for Patients: EntrepreneurPulse.com.au  Fact Sheet for Healthcare Providers: IncredibleEmployment.be  This test is not yet approved or cleared by the [place] FDA and has been authorized for detection and/or diagnosis of  SARS-CoV-2 by FDA under an Emergency Use Authorization (EUA). This EUA will remain in effect (meaning this test can be used) for the duration of the COVID-19 declaration under Section 564(b)(1) of the Act, 21 U.S.C. section 360bbb-3(b)(1), unless the authorization is terminated or revoked.  Performed at [facility], [address], [place], [place] [address]      Radiology Studies: CT Head Wo Contrast  Result Date: 09/12/2021 CLINICAL DATA:  Vomiting and lethargy. EXAM: CT HEAD WITHOUT CONTRAST TECHNIQUE: Contiguous axial images were obtained from the base of the skull through the vertex without intravenous contrast. RADIATION DOSE REDUCTION: This exam was performed according to the departmental dose-optimization program which includes  automated exposure control, adjustment of the mA and/or kV according to patient size and/or use of iterative reconstruction technique. COMPARISON:  None Available. FINDINGS: Brain: No evidence of acute infarction, hemorrhage, hydrocephalus, extra-axial collection or mass lesion/mass effect. Vascular: No hyperdense vessel or unexpected calcification. Skull: Normal. Negative for fracture or focal lesion. Sinuses/Orbits: No acute finding. Other: None. IMPRESSION: No acute intracranial pathology. Electronically Signed   By: Virgina Norfolk M.D.   On: 09/12/2021 00:40   CT Chest W Contrast  Result Date: 09/12/2021 CLINICAL DATA:  Right pleural effusion and possible lymphoma seen on earlier abdomen and pelvis CT. EXAM: CT CHEST WITH CONTRAST TECHNIQUE: Multidetector CT imaging of the chest was performed during intravenous contrast administration. RADIATION DOSE REDUCTION: This exam was performed according to the departmental dose-optimization program which includes automated exposure control, adjustment of the mA and/or kV according to patient size and/or use of iterative reconstruction technique. CONTRAST:  25m OMNIPAQUE IOHEXOL 300 MG/ML  SOLN COMPARISON:  None Available. FINDINGS: Cardiovascular: It should be noted that evaluation of the vascular structures is markedly limited secondary to suboptimal opacification with intravenous contrast. There is mild to moderate severity calcification of the aortic arch, without evidence of aortic aneurysm. Normal heart size with marked severity coronary artery calcification. No pericardial effusion. Mediastinum/Nodes: Numerous enlarged pretracheal and bilateral axillary lymph nodes are seen. The largest pretracheal lymph node measures approximately 2.7 cm x 1.5 cm (axial CT image 33, CT series 2). Numerous smaller AP window, subcarinal and bilateral hilar lymph nodes are noted (the largest is seen within the region of the AP window and measures 1.3 cm x 0.9 cm. Enlarged  lymph nodes are also seen within the visualized neck soft tissues, posterior to the proximal portions of the bilateral clavicles (the largest measures 2.8 cm x 1.7 cm/axial CT image 5, CT series 2). Thyroid gland, trachea, and esophagus demonstrate no significant findings. Lungs/Pleura: Moderate severity inferior right upper lobe, right middle lobe, lingular and bilateral lower lobe linear atelectasis is noted. There are small bilateral pleural effusions, right slightly greater than left. No pneumothorax is identified. Upper Abdomen: Numerous enlarged mesenteric lymph nodes are seen within the visualized portion of the upper abdomen. Musculoskeletal: Multilevel degenerative changes seen throughout the thoracic spine. IMPRESSION: 1. Extensive areas of lymphadenopathy within the neck, chest and visualized portion of the abdomen, consistent with a lymphoproliferative disorder. Correlation with tissue sampling is recommended. 2. Moderate severity bilateral linear atelectasis with small bilateral pleural effusions, right slightly greater than left. 3. Marked severity coronary artery disease. Aortic Atherosclerosis (ICD10-I70.0). Electronically Signed   By: TVirgina NorfolkM.D.   On: 09/12/2021 00:39   CT ABDOMEN PELVIS W CONTRAST  Result Date: 09/11/2021 CLINICAL DATA:  Right-sided abdominal pain EXAM: CT ABDOMEN AND PELVIS WITH CONTRAST TECHNIQUE: Multidetector CT imaging of the abdomen and  pelvis was performed using the standard protocol following bolus administration of intravenous contrast. RADIATION DOSE REDUCTION: This exam was performed according to the departmental dose-optimization program which includes automated exposure control, adjustment of the mA and/or kV according to patient size and/or use of iterative reconstruction technique. CONTRAST:  64m OMNIPAQUE IOHEXOL 300 MG/ML  SOLN COMPARISON:  02/04/2018. FINDINGS: Lower chest: Lung bases demonstrate mild atelectatic changes. Small right-sided pleural  effusion is seen. Hepatobiliary: No focal liver abnormality is seen. No gallstones, gallbladder wall thickening, or biliary dilatation. Cholelithiasis seen on prior ultrasound is not well appreciated on this exam Pancreas: Pancreas appears within normal limits. Spleen: Normal in size without focal abnormality. Adrenals/Urinary Tract: Adrenal glands are within normal limits bilaterally. Kidneys demonstrate a normal enhancement pattern. No calculi or obstructive changes are seen. The bladder is partially distended. Stomach/Bowel: No obstructive or inflammatory changes of the colon are seen. Scattered diverticular changes noted. The appendix is partially visualized. No definitive Peri appendiceal inflammatory changes to suggest appendicitis are noted. Vascular/Lymphatic: Vascular calcifications of the abdominal aorta are noted. Diffuse retroperitoneal and intra-abdominal adenopathy is identified. Index node in the portacaval space measures 3.3 cm in short axis. Multiple Peri aortic, pericaval and intra-aortocaval nodes are seen. Index node on image number 51 of series 2 measures 2.4 cm in short axis. Large iliac nodal masses are seen measuring 3.7 cm in short axis on the left and 2.5 cm in short axis on the right. Inguinal adenopathy is noted. Dominant node measures 3 cm in short axis on the left. Scattered smaller lymph nodes are seen. Reproductive: Prostate is unremarkable. Other: Mild free fluid is noted within the abdomen along the liver margin and scattered throughout the pelvis. Musculoskeletal: Degenerative changes of lumbar spine are noted. No acute bony abnormality is seen. IMPRESSION: Extensive intra-abdominal and retroperitoneal adenopathy as described. These changes are most consistent with lymphoma till proven otherwise. Tissue sampling is recommended for further evaluation. Be performed in the left inguinal region without difficulty. Appendix shows no findings to suggest appendicitis. Mild atelectatic  changes with small right-sided pleural effusion. Previously seen gallstones are not well appreciated on this exam. Electronically Signed   By: MInez CatalinaM.D.   On: 09/11/2021 21:15   DG Chest 2 View  Result Date: 09/11/2021 CLINICAL DATA:  Weakness.  Fever. EXAM: CHEST - 2 VIEW COMPARISON:  None Available. FINDINGS: Right basilar and left lingular opacities, somewhat platelike. No pneumothorax. No nodules or masses. The cardiomediastinal silhouette is normal. IMPRESSION: Lingular and right basilar opacities. The platelike nature suggests the possibility of atelectasis. However, given history of fever, pneumonia is not excluded. Recommend short-term follow-up to ensure resolution. Electronically Signed   By: DDorise BullionIII M.D.   On: 09/11/2021 16:51    Scheduled Meds:  allopurinol  300 mg Oral Daily   aspirin EC  81 mg Oral Daily   calcitonin  400 Units Subcutaneous BID   enoxaparin (LOVENOX) injection  40 mg Subcutaneous Q24H   Continuous Infusions:  sodium chloride 125 mL/hr at 09/12/21 1540     LOS: 1 day   AShelly Coss MD [facility] P8/29/2023, 7:58 AM

## 2021-09-13 NOTE — Progress Notes (Addendum)
Vascular and Interventional Radiology  PRE PROCEDURE NOTE  Assessment  Plan:   [name] is a 61 y.o. year old male who will undergo INGUINAL LYMPH NODE BIOPSY in Interventional Radiology.  The procedure has been fully reviewed with the patient/patient's authorized representative. The risks, benefits and alternatives have been explained, and the patient/patient's authorized representative has consented to the procedure.  HPI: [name] is a 61 y.o. year old male w PMHx significant for polyarthritis, RA who presented to ER with N/V, sweats and chills. Imaging remarkable for adenopathy. Pt recommended for biopsy for oncologic workup.  Informed consent was obtained, witnessed and placed in the patient's chart.  CT AP, 09/11/21    Allergies:  Allergies  Allergen Reactions   Zestril [Lisinopril] Cough    Medications:  No current facility-administered medications on file prior to encounter.   Current Outpatient Medications on File Prior to Encounter  Medication Sig Dispense Refill   allopurinol (ZYLOPRIM) 300 MG tablet Take 300 mg by mouth daily.      aspirin EC 81 MG tablet Take 81 mg by mouth daily.     Cholecalciferol (VITAMIN D PO) Take 5,000 Units by mouth 2 (two) times daily.     ENBREL SURECLICK 50 MG/ML injection Inject 50 mg into the skin once a week.     fluocinonide ointment (LIDEX) 1.61 % Apply 1 Application topically 2 (two) times daily.     losartan (COZAAR) 50 MG tablet TAKE 1 TABLET AT NIGHT FOR BLOOD PRESSURE (Patient taking differently: Take 50 mg by mouth at bedtime.) 90 tablet 3   Magnesium 500 MG TABS Take 1 tablet by mouth daily.     Multiple Vitamin (MULTIVITAMIN) tablet Take 1 tablet by mouth daily.     Omega-3 Fatty Acids (FISH OIL) 1200 MG CAPS Take by mouth 2 (two) times daily.     PREVIDENT 5000 PLUS 1.1 % CREA dental cream Place 1 Application onto teeth at bedtime.     rosuvastatin (CRESTOR) 20 MG tablet Take 1 tablet (20 mg total) by  mouth daily. for cholesterol 90 tablet 3   testosterone cypionate (DEPOTESTOSTERONE CYPIONATE) 200 MG/ML injection INJECT 1 ML INTRAMUSCULARLY EVERY 7 DAYS. DISCARD THE REMAINDER. (Patient taking differently: Inject 200 mg into the muscle once a week. Discard any remainder.) 12 mL 1   vitamin B-12 (CYANOCOBALAMIN) 1000 MCG tablet Take 1,000 mcg by mouth daily.     vitamin C (ASCORBIC ACID) 500 MG tablet Take 500 mg by mouth 2 (two) times daily.     Zinc 50 MG TABS Take 1 tablet by mouth daily.     SYRINGE-NEEDLE, DISP, 3 ML (B-D 3CC LUER-LOK SYR 21GX1") 21G X 1" 3 ML MISC Use 1 syringe with injection 50 each 1    PSH:  Past Surgical History:  Procedure Laterality Date   CATARACT EXTRACTION Right 04/2020   Dr. Tommy Rainwater   CATARACT EXTRACTION Left 05/2020   Dr. Tommy Rainwater   WISDOM TOOTH EXTRACTION      PMH:  Past Medical History:  Diagnosis Date   Arthritis    [place] Polyarthritis   Cholelithiases 02/04/2018   Per Abd [place] 01/2018 with fatty liver, no inflammation or obstruction   Elevated hemoglobin A1c measurement    Gout    Hyperlipidemia    Hypertension    Rheumatoid arthritis ([place])     Brief Physical Examination: Vitals:   09/13/21 0856 09/13/21 1342  BP: 123/81 (P) 129/68  Pulse: 93   Resp:    Temp:  97.7 F (36.5 C)   SpO2: 94%    General: WD, WN male in NAD HEENT: Normocephalic, atraumatic Lungs: Respirations non-labored  ASA Grade: 3 Mallampati Class: 3   Michaelle Birks, MD [facility]   Pager. [place]

## 2021-09-14 LAB — COMPREHENSIVE METABOLIC PANEL
ALT: 57 U/L — ABNORMAL HIGH (ref 0–44)
AST: 50 U/L — ABNORMAL HIGH (ref 15–41)
Albumin: 2.6 g/dL — ABNORMAL LOW (ref 3.5–5.0)
Alkaline Phosphatase: 191 U/L — ABNORMAL HIGH (ref 38–126)
Anion gap: 6 (ref 5–15)
BUN: 52 mg/dL — ABNORMAL HIGH (ref 6–20)
CO2: 23 mmol/L (ref 22–32)
Calcium: 12 mg/dL — ABNORMAL HIGH (ref 8.9–10.3)
Chloride: 105 mmol/L (ref 98–111)
Creatinine, Ser: 2.06 mg/dL — ABNORMAL HIGH (ref 0.61–1.24)
GFR, Estimated: 36 mL/min — ABNORMAL LOW (ref >60)
Glucose, Bld: 93 mg/dL (ref 70–99)
Potassium: 4.8 mmol/L (ref 3.5–5.1)
Sodium: 134 mmol/L — ABNORMAL LOW (ref 135–145)
Total Bilirubin: 0.9 mg/dL (ref 0.3–1.2)
Total Protein: 5.2 g/dL — ABNORMAL LOW (ref 6.5–8.1)

## 2021-09-14 LAB — CBC
HCT: 46.4 % (ref 39.0–52.0)
Hemoglobin: 15.5 g/dL (ref 13.0–17.0)
MCH: 30.6 pg (ref 26.0–34.0)
MCHC: 33.4 g/dL (ref 30.0–36.0)
MCV: 91.7 fL (ref 80.0–100.0)
Platelets: 158 10*3/uL (ref 150–400)
RBC: 5.06 MIL/uL (ref 4.22–5.81)
RDW: 15.8 % — ABNORMAL HIGH (ref 11.5–15.5)
WBC: 14.6 10*3/uL — ABNORMAL HIGH (ref 4.0–10.5)
nRBC: 0 % (ref 0.0–0.2)

## 2021-09-14 NOTE — Plan of Care (Signed)
  Problem: Education: Goal: Knowledge of General Education information will improve Description: Including pain rating scale, medication(s)/side effects and non-pharmacologic comfort measures Outcome: Progressing   Problem: Safety: Goal: Ability to remain free from injury will improve Outcome: Progressing   

## 2021-09-14 NOTE — Progress Notes (Signed)
PROGRESS NOTE    Thomas Waller  NTI:[id number] DOB: [birth year] DOA: 09/11/2021 PCP: Unk Pinto, MD    Brief Narrative:   Thomas Waller is a 61 y.o. male with past medical history significant for rheumatoid arthritis, cholelithiasis, gout, HLD, HTN who presented to [facility] ED on 8/27 with fatigue, decreased appetite, nausea, chills, lower abdominal pain, unexplained weight loss of 20 pounds.  In the ED, temperature 98.0 F, HR 94, RR 17, BP 122/79, SPO2 94% on room air.  WBC 15.4, hemoglobin 16.4, platelets 193.  Sodium 133, potassium 4.8, chloride 95, CO2 32, glucose 85, BUN 39, creatinine 1.68.  Calcium 16.9.  Alkaline phosphatase 177, AST 45, ALT 55, total bilirubin 1.0.  Lipase 45.  WBC 15.4, hemoglobin 16.4, platelets 193.  High sensitive troponin 15.  COVID-19 PCR negative.  Influenza A/B PCR negative.  Chest x-ray with linear and right basilar opacities, platelike features suggested possibility of atelectasis.  CT abdomen/pelvis showed extensive intra-abdominal and retroperitoneal lymphadenopathy.  CT chest with contrast showed extensive areas of lymphadenopathy of the neck, chest, abdomen.  Patient suspected with new onset lymphoma.  Oncology consulted.  Duration consulted for admission for management of hypercalcemia of malignancy and further work-up for likely lymphoma.  Assessment & Plan:   Hypercalcemia malignancy Patient presenting to ED with weakness, decreased appetite and fatigue.  Calcium level elevated 16.9, most likely secondary to lymphoma.  Patient was started on IV fluids, subcutaneous calcitonin and Zometa. --Ca 16.9>>12.0 --Repeat BMP in a.m.  Suspected lymphoma Patient presenting with fatigue, decreased appetite, nausea/chills, lower abdominal pain and unexplained weight loss of 20 pounds.  CT abdomen/pelvis showed extensive intra-abdominal and retroperitoneal lymphadenopathy.  CT chest with contrast showed extensive areas of lymphadenopathy of the neck, chest,  abdomen.  Patient underwent ultrasound-guided biopsy of right inguinal lymph node by interventional radiology on 8/29. --Medical oncology, Dr. 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  Baseline creatinine 1.2-1.5. --Cr 1.90>1.83>1.91>2.06 --Holding home losartan --NS at 125 mph --Avoid nephrotoxins, renal dose all medications --monitor urinary output --BMP in a.m.  Elevated liver enzymes Etiology likely associated with lymphoma.  Leukocytosis Etiology likely reactive or secondary to lymphoma.  Essential hypertension Holding losartan due to AKI, currently normotensive. --Continue monitor BP  Hyperlipidemia --Holding home statin due to transaminitis  Idiopathic gout -- Allopurinol 300 mg p.o. daily  Morbid obesity Body mass index is 36.22 kg/m.  Discussed with patient needs for aggressive lifestyle changes/weight loss as this complicates all facets of care.  Outpatient follow-up with PCP.     DVT prophylaxis: enoxaparin (LOVENOX) injection 40 mg Start: 09/12/21 2200    Code Status: Full Code Family Communication: No family present at bedside  Disposition Plan:  Level of care: Progressive Status is: Inpatient Remains inpatient appropriate because: Anticipate discharge home once calcium level normalizes, suspect 1-2 days    Consultants:  Medical oncology, Dr. Alen Blew Interventional radiology  Procedures:  Ultrasound guided lymph node biopsy, interventional radiology 8/29  Antimicrobials:  None   Subjective: Patient seen examined bedside, resting comfortably.  Lying in bed.  Sleeping but easily arousable.  Continues with overall fatigue and mild abdominal discomfort.  Discussed that calcium continues to slowly improve but remains elevated.  Awaiting biopsy results.  Anticipate will be able to  discharge home in 1-2 days with outpatient follow-up with medical oncology.  No other questions or concerns at this time.  Denies headache, no current fever/chills, no night sweats, no nausea/vomiting/diarrhea, no chest pain, no palpitations, no shortness of breath, no focal weakness, no cough/congestion, no paresthesias.  No acute events overnight per nursing staff.  Objective: Vitals:   09/13/21 2116 09/14/21 0500 09/14/21 1324 09/14/21 1423  BP: 126/80 130/87 139/82 136/74  Pulse: 99 96 96 90  Resp: '16 16 16 20  '$ Temp: (!) 97.4 F (36.3 C) 98.3 F (36.8 C) 99.1 F (37.3 C) 97.8 F (36.6 C)  TempSrc:   Oral Oral  SpO2: 92% 91% 90% 94%  Weight:      Height:        Intake/Output Summary (Last 24 hours) at 09/14/2021 1619 Last data filed at 09/14/2021 0600 Gross per 24 hour  Intake 2605.37 ml  Output 850 ml  Net 1755.37 ml   Filed Weights   09/12/21 1258  Weight: 114.5 kg    Examination:  Physical Exam: GEN: NAD, alert and oriented x 3, obese, fatigued/weak in appearance HEENT: NCAT, PERRL, EOMI, sclera clear, MMM PULM: CTAB w/o wheezes/crackles, normal respiratory effort CV: RRR w/o M/G/R GI: abd soft, NTND, NABS, no R/G/M MSK: no peripheral edema, moves all extremities independently NEURO: CN II-XII intact, no focal deficits, sensation to light touch intact PSYCH: normal mood/affect Integumentary: dry/intact, no rashes or wounds    Data Reviewed: I have personally reviewed following labs and imaging studies  CBC: Recent Labs  Lab 09/08/21 1221 09/11/21 1923 09/12/21 1322 09/13/21 0442 09/14/21 0417  WBC 11.7* 15.4* 15.8* 16.1* 14.6*  NEUTROABS 7,523 9.8* 10.7*  --   --   HGB 17.0 16.4 16.8 15.4 15.5  HCT 49.5 47.3 50.6 46.2 46.4  MCV 88.2 86.5 90.8 90.2 91.7  PLT 192 193 170 163 979   Basic Metabolic Panel: Recent Labs  Lab 09/12/21 0037 09/12/21 1322 09/12/21 1846 09/13/21 0442 09/14/21 0417  NA 131* 131* 134* 134* 134*  K 4.8 4.9 4.9 5.0 4.8   CL 97* 99 102 104 105  CO2 '29 25 25 25 23  '$ GLUCOSE 109* 92 105* 102* 93  BUN 41* 42* 44* 46* 52*  CREATININE 1.73* 1.90* 1.83* 1.91* 2.06*  CALCIUM >15.0* 14.7* 13.7* 12.7* 12.0*  MG  --  2.0  --   --   --   PHOS  --  5.2*  --   --   --    GFR: Estimated Creatinine Clearance: 48.3 mL/min (A) (by C-G formula based on SCr of 2.06 mg/dL (H)). Liver Function Tests: Recent Labs  Lab 09/08/21 1221 09/11/21 1923 09/13/21 0442 09/14/21 0417  AST 54* 45* 46* 50*  ALT 66* 55* 52* 57*  ALKPHOS  --  177* 176* 191*  BILITOT 1.0 1.0 0.8 0.9  PROT 6.2 6.1* 5.4* 5.2*  ALBUMIN  --  3.5 2.7* 2.6*   Recent Labs  Lab 09/11/21 1923  LIPASE 45   No results for input(s): "AMMONIA" in the last 168 hours. Coagulation Profile: No results for input(s): "INR", "PROTIME" in the last 168 hours. Cardiac Enzymes: Recent Labs  Lab 09/12/21 1846  CKTOTAL 21*   BNP (last 3 results) No results for input(s): "PROBNP" in the last 8760 hours. HbA1C: No results for input(s): "HGBA1C" in the last 72 hours. CBG: No results for input(s): "GLUCAP" in the last 168 hours. Lipid Profile: No results for input(s): "CHOL", "HDL", "LDLCALC", "TRIG", "CHOLHDL", "LDLDIRECT" in the last 72 hours. Thyroid Function Tests: No results for input(s): "TSH", "T4TOTAL", "FREET4", "T3FREE", "THYROIDAB" in the last 72 hours.  Anemia Panel: No results for input(s): "VITAMINB12", "FOLATE", "FERRITIN", "TIBC", "IRON", "RETICCTPCT" in the last 72 hours. Sepsis Labs: No results for input(s): "PROCALCITON", "LATICACIDVEN" in the last 168 hours.  Recent Results (from the past 240 hour(s))  Resp Panel by RT-PCR (Flu A&B, Covid) Anterior Nasal Swab     Status: None   Collection Time: 09/11/21  7:23 PM   Specimen: Anterior Nasal Swab  Result Value Ref Range Status   SARS Coronavirus 2 by RT PCR NEGATIVE NEGATIVE Final    Comment: (NOTE) SARS-CoV-2 target nucleic acids are NOT DETECTED.  The SARS-CoV-2 RNA is generally  detectable in upper respiratory specimens during the acute phase of infection. The lowest concentration of SARS-CoV-2 viral copies this assay can detect is 138 copies/mL. A negative result does not preclude SARS-Cov-2 infection and should not be used as the sole basis for treatment or other patient management decisions. A negative result may occur with  improper specimen collection/handling, submission of specimen other than nasopharyngeal swab, presence of viral mutation(s) within the areas targeted by this assay, and inadequate number of viral copies(<138 copies/mL). A negative result must be combined with clinical observations, patient history, and epidemiological information. The expected result is Negative.  Fact Sheet for Patients:  EntrepreneurPulse.com.au  Fact Sheet for Healthcare Providers:  IncredibleEmployment.be  This test is no t yet approved or cleared by the [place] FDA and  has been authorized for detection and/or diagnosis of SARS-CoV-2 by FDA under an Emergency Use Authorization (EUA). This EUA will remain  in effect (meaning this test can be used) for the duration of the COVID-19 declaration under Section 564(b)(1) of the Act, 21 U.S.C.section 360bbb-3(b)(1), unless the authorization is terminated  or revoked sooner.       Influenza A by PCR NEGATIVE NEGATIVE Final   Influenza B by PCR NEGATIVE NEGATIVE Final    Comment: (NOTE) The Xpert Xpress SARS-CoV-2/FLU/RSV plus assay is intended as an aid in the diagnosis of influenza from Nasopharyngeal swab specimens and should not be used as a sole basis for treatment. Nasal washings and aspirates are unacceptable for Xpert Xpress SARS-CoV-2/FLU/RSV testing.  Fact Sheet for Patients: EntrepreneurPulse.com.au  Fact Sheet for Healthcare Providers: IncredibleEmployment.be  This test is not yet approved or cleared by the [place] FDA  and has been authorized for detection and/or diagnosis of SARS-CoV-2 by FDA under an Emergency Use Authorization (EUA). This EUA will remain in effect (meaning this test can be used) for the duration of the COVID-19 declaration under Section 564(b)(1) of the Act, 21 U.S.C. section 360bbb-3(b)(1), unless the authorization is terminated or revoked.  Performed at [facility], [address] [place],  [address]          Radiology Studies: [place] CORE BIOPSY (LYMPH NODES)  Result Date: 09/13/2021 INDICATION: Lymphadenopathy.  No malignant history. EXAM: ULTRASOUND-GUIDED RIGHT INGUINAL LYMPH NODE BIOPSY. COMPARISON:  CT AP, 09/11/2021. MEDICATIONS: None ANESTHESIA/SEDATION: Local anesthetic was administered. COMPLICATIONS: None immediate. TECHNIQUE: Informed written consent was obtained from the patient and/or patient's representative after a discussion of the risks, benefits and alternatives to treatment. Questions regarding the procedure were encouraged and answered. Initial ultrasound scanning demonstrated pathologically-enlarged RIGHT inguinal lymph nodes. An ultrasound image was saved for documentation purposes. The procedure was planned. A timeout was performed prior to the initiation of the procedure. The operative was prepped and draped in the usual sterile fashion, and a sterile drape was applied covering the operative field. A timeout was performed prior to the initiation of  the procedure. Local anesthesia was provided with 1% lidocaine with epinephrine. Under direct ultrasound guidance, an 18 gauge core needle device was utilized to obtain to obtain 3 core needle biopsies of the RIGHT inguinal lymph node. The samples were placed in saline and submitted to pathology. The needle was removed and hemostasis was achieved with manual compression. Post procedure scan was negative for significant hematoma. A dressing was placed. The patient tolerated the procedure well  without immediate postprocedural complication. IMPRESSION: Successful ultrasound guided biopsy of an enlarged RIGHT lymph node, as above. Michaelle Birks, MD [facility] Radiology Electronically Signed   By: Michaelle Birks M.D.   On: 09/13/2021 17:16   ECHOCARDIOGRAM COMPLETE  Result Date: 09/13/2021    ECHOCARDIOGRAM REPORT   Patient Name:   HAKEEM FRAZZINI Date of Exam: 09/13/2021 Medical Rec #:  [id number]       Height:       70.0 in Accession #:    [id number]      Weight:       252.4 lb Date of Birth:  May 10, 1960       BSA:          2.304 m Patient Age:    72 years        BP:           123/81 mmHg Patient Gender: M               HR:           85 bpm. Exam Location:  Inpatient Procedure: 2D Echo, Cardiac Doppler and Color Doppler Indications:    CAD  History:        Patient has no prior history of Echocardiogram examinations.                 Risk Factors:Hypertension and Dyslipidemia.  Sonographer:    Memory Argue Referring Phys: [id number] [place]  1. Left ventricular ejection fraction, by estimation, is 65 to 70%. The left ventricle has normal function. Left ventricular endocardial border not optimally defined to evaluate regional wall motion. There is moderate concentric left ventricular hypertrophy. Left ventricular diastolic parameters are consistent with Grade I diastolic dysfunction (impaired relaxation).  2. Right ventricular systolic function is normal. The right ventricular size is normal. There is mildly elevated pulmonary artery systolic pressure. The estimated right ventricular systolic pressure is 86.7 mmHg.  3. The mitral valve is normal in structure. No evidence of mitral valve regurgitation. No evidence of mitral stenosis.  4. The aortic valve is tricuspid. There is mild thickening of the aortic valve. Aortic valve regurgitation is not visualized. Aortic valve sclerosis is present, with no evidence of aortic valve stenosis.  5.  The inferior vena cava is normal in size with greater than 50% respiratory variability, suggesting right atrial pressure of 3 mmHg. Comparison(s): No prior Echocardiogram. FINDINGS  Left Ventricle: Left ventricular ejection fraction, by estimation, is 65 to 70%. The left ventricle has normal function. Left ventricular endocardial border not optimally defined to evaluate regional wall motion. The left ventricular internal cavity size was normal in size. There is moderate concentric left ventricular hypertrophy. Left ventricular diastolic parameters are consistent with Grade I diastolic dysfunction (impaired relaxation). Right Ventricle: The right ventricular size is normal. No increase in right ventricular wall thickness. Right ventricular systolic function is normal. There is mildly elevated pulmonary artery systolic pressure. The tricuspid regurgitant velocity is 3.03  m/s, and with an assumed right atrial pressure of  3 mmHg, the estimated right ventricular systolic pressure is 98.1 mmHg. Left Atrium: Left atrial size was normal in size. Right Atrium: Right atrial size was normal in size. Pericardium: There is no evidence of pericardial effusion. Mitral Valve: The mitral valve is normal in structure. No evidence of mitral valve regurgitation. No evidence of mitral valve stenosis. Tricuspid Valve: The tricuspid valve is normal in structure. Tricuspid valve regurgitation is not demonstrated. No evidence of tricuspid stenosis. Aortic Valve: The aortic valve is tricuspid. There is mild thickening of the aortic valve. There is mild aortic valve annular calcification. Aortic valve regurgitation is not visualized. Aortic valve sclerosis is present, with no evidence of aortic valve  stenosis. Aortic valve mean gradient measures 9.0 mmHg. Aortic valve peak gradient measures 14.1 mmHg. Aortic valve area, by VTI measures 2.77 cm. Pulmonic Valve: The pulmonic valve was normal in structure. Pulmonic valve regurgitation is  mild. No evidence of pulmonic stenosis. Aorta: The aortic root is normal in size and structure. Venous: The inferior vena cava is normal in size with greater than 50% respiratory variability, suggesting right atrial pressure of 3 mmHg. IAS/Shunts: No atrial level shunt detected by color flow Doppler.  LEFT VENTRICLE PLAX 2D LVIDd:         4.50 cm   Diastology LVIDs:         2.90 cm   LV e' medial:    9.68 cm/s LV PW:         1.20 cm   LV E/e' medial:  6.1 LV IVS:        1.20 cm   LV e' lateral:   13.30 cm/s LVOT diam:     2.10 cm   LV E/e' lateral: 4.4 LV SV:         98 LV SV Index:   42 LVOT Area:     3.46 cm  RIGHT VENTRICLE TAPSE (M-mode): 1.8 cm LEFT ATRIUM             Index        RIGHT ATRIUM           Index LA diam:        3.30 cm 1.43 cm/m   RA Area:     14.60 cm LA Vol (A2C):   51.3 ml 22.26 ml/m  RA Volume:   35.50 ml  15.41 ml/m LA Vol (A4C):   65.2 ml 28.29 ml/m LA Biplane Vol: 63.1 ml 27.38 ml/m  AORTIC VALVE AV Area (Vmax):    2.67 cm AV Area (Vmean):   2.65 cm AV Area (VTI):     2.77 cm AV Vmax:           188.00 cm/s AV Vmean:          140.000 cm/s AV VTI:            0.353 m AV Peak Grad:      14.1 mmHg AV Mean Grad:      9.0 mmHg LVOT Vmax:         145.00 cm/s LVOT Vmean:        107.000 cm/s LVOT VTI:          0.282 m LVOT/AV VTI ratio: 0.80  AORTA Ao Root diam: 3.30 cm MITRAL VALVE               TRICUSPID VALVE MV Area (PHT): 3.42 cm    TR Peak grad:   36.7 mmHg MV Decel Time: 222 msec    TR Vmax:  303.00 cm/s MV E velocity: 58.90 cm/s MV A velocity: 69.70 cm/s  SHUNTS MV E/A ratio:  0.85        Systemic VTI:  0.28 m                            Systemic Diam: 2.10 cm Rudean Haskell MD Electronically signed by Rudean Haskell MD Signature Date/Time: 09/13/2021/3:58:26 PM    Final    US RENAL  Result Date: 09/13/2021 CLINICAL DATA:  Acute kidney injury. EXAM: RENAL / URINARY TRACT ULTRASOUND COMPLETE COMPARISON:  None Available. FINDINGS: Right Kidney: Renal measurements:  11.6 x 5.4 x 5.9 cm = volume: 195 mL. Echogenicity within normal limits. No mass or hydronephrosis visualized. Left Kidney: Renal measurements: 12.0 x 5.9 x 6.9 cm = volume: 255 mL. Echogenicity within normal limits. No mass or hydronephrosis visualized. Bladder: Appears normal for degree of bladder distention. Other: None. IMPRESSION: Unremarkable study. No evidence for hydronephrosis or increased renal echogenicity to suggest medical renal disease. Electronically Signed   By: Misty Stanley M.D.   On: 09/13/2021 15:49        Scheduled Meds:  allopurinol  300 mg Oral Daily   aspirin EC  81 mg Oral Daily   enoxaparin (LOVENOX) injection  40 mg Subcutaneous Q24H   polyethylene glycol  17 g Oral Daily   Continuous Infusions:  sodium chloride 125 mL/hr at 09/14/21 0554     LOS: 2 days    Time spent: 51 minutes spent on chart review, discussion with nursing staff, consultants, updating family and interview/physical exam; more than 50% of that time was spent in counseling and/or coordination of care.    Real Cona J [place] DO [facility] Available via Epic secure chat 7am-7pm After these hours, please refer to coverage provider listed on [email or web address] 09/14/2021, 4:19 PM

## 2021-09-14 NOTE — Progress Notes (Signed)
Mobility Specialist - Progress Note    09/14/21 1418  Mobility  Activity Ambulated with assistance in hallway  Level of Assistance Contact guard assist, steadying assist  Assistive Device Front wheel walker  Distance Ambulated (ft) 120 ft  Activity Response Tolerated fair  $Mobility charge 1 Mobility   Pt was found EOB and agreeable to mobilize. Pt stated having an abdominal cramp during ambulation and proceeded to have him sit on the recliner chair and rolled back to room. Pt was left with necessities in reach and RN in room.  Ferd Hibbs Mobility Specialist

## 2021-09-14 NOTE — Progress Notes (Signed)
Pt started having abdominal pain while ambulating with mobility specialist. We assisted pt back to room in recliner. Vitals within normal limits. See chart. Pt resting comfortably in room no signs of distress. Will continue with plan of care. Tylenol was administered for mild pain prior to ambulation.

## 2021-09-15 ENCOUNTER — Emergency Department (HOSPITAL_COMMUNITY)
Admission: EM | Admit: 2021-09-15 | Discharge: 2021-09-15 | Disposition: A | Discharge status: Home / Self Care | Payer: BC Managed Care – PPO | Attending: Emergency Medicine | Admitting: Emergency Medicine

## 2021-09-15 ENCOUNTER — Telehealth: Payer: Self-pay | Admitting: Oncology

## 2021-09-15 ENCOUNTER — Encounter (HOSPITAL_COMMUNITY): Payer: Self-pay

## 2021-09-15 ENCOUNTER — Other Ambulatory Visit: Payer: Self-pay

## 2021-09-15 DIAGNOSIS — R140 Abdominal distension (gaseous): Secondary | ICD-10-CM | POA: Diagnosis not present

## 2021-09-15 DIAGNOSIS — E860 Dehydration: Secondary | ICD-10-CM | POA: Diagnosis not present

## 2021-09-15 DIAGNOSIS — R110 Nausea: Secondary | ICD-10-CM | POA: Insufficient documentation

## 2021-09-15 DIAGNOSIS — R5381 Other malaise: Secondary | ICD-10-CM | POA: Diagnosis not present

## 2021-09-15 DIAGNOSIS — R591 Generalized enlarged lymph nodes: Secondary | ICD-10-CM | POA: Diagnosis not present

## 2021-09-15 DIAGNOSIS — R5383 Other fatigue: Secondary | ICD-10-CM | POA: Insufficient documentation

## 2021-09-15 DIAGNOSIS — R109 Unspecified abdominal pain: Secondary | ICD-10-CM | POA: Diagnosis not present

## 2021-09-15 LAB — URINALYSIS, ROUTINE W REFLEX MICROSCOPIC
Bilirubin Urine: NEGATIVE
Glucose, UA: NEGATIVE mg/dL
Hgb urine dipstick: NEGATIVE
Ketones, ur: NEGATIVE mg/dL
Leukocytes,Ua: NEGATIVE
Nitrite: NEGATIVE
Protein, ur: NEGATIVE mg/dL
Specific Gravity, Urine: 1.015 (ref 1.005–1.030)
pH: 5 (ref 5.0–8.0)

## 2021-09-15 LAB — COMPREHENSIVE METABOLIC PANEL
ALT: 65 U/L — ABNORMAL HIGH (ref 0–44)
ALT: 74 U/L — ABNORMAL HIGH (ref 0–44)
AST: 58 U/L — ABNORMAL HIGH (ref 15–41)
AST: 70 U/L — ABNORMAL HIGH (ref 15–41)
Albumin: 2.7 g/dL — ABNORMAL LOW (ref 3.5–5.0)
Albumin: 2.8 g/dL — ABNORMAL LOW (ref 3.5–5.0)
Alkaline Phosphatase: 234 U/L — ABNORMAL HIGH (ref 38–126)
Alkaline Phosphatase: 276 U/L — ABNORMAL HIGH (ref 38–126)
Anion gap: 5 (ref 5–15)
Anion gap: 11 (ref 5–15)
BUN: 51 mg/dL — ABNORMAL HIGH (ref 6–20)
BUN: 51 mg/dL — ABNORMAL HIGH (ref 6–20)
CO2: 22 mmol/L (ref 22–32)
CO2: 22 mmol/L (ref 22–32)
Calcium: 11.5 mg/dL — ABNORMAL HIGH (ref 8.9–10.3)
Calcium: 12 mg/dL — ABNORMAL HIGH (ref 8.9–10.3)
Chloride: 104 mmol/L (ref 98–111)
Chloride: 99 mmol/L (ref 98–111)
Creatinine, Ser: 1.91 mg/dL — ABNORMAL HIGH (ref 0.61–1.24)
Creatinine, Ser: 2.07 mg/dL — ABNORMAL HIGH (ref 0.61–1.24)
GFR, Estimated: 40 mL/min — ABNORMAL LOW (ref >60)
GFR, Estimated: 36 mL/min — ABNORMAL LOW (ref >60)
Glucose, Bld: 90 mg/dL (ref 70–99)
Glucose, Bld: 95 mg/dL (ref 70–99)
Potassium: 4.9 mmol/L (ref 3.5–5.1)
Potassium: 5.2 mmol/L — ABNORMAL HIGH (ref 3.5–5.1)
Sodium: 131 mmol/L — ABNORMAL LOW (ref 135–145)
Sodium: 132 mmol/L — ABNORMAL LOW (ref 135–145)
Total Bilirubin: 1.1 mg/dL (ref 0.3–1.2)
Total Bilirubin: 1.2 mg/dL (ref 0.3–1.2)
Total Protein: 5.5 g/dL — ABNORMAL LOW (ref 6.5–8.1)
Total Protein: 5.4 g/dL — ABNORMAL LOW (ref 6.5–8.1)

## 2021-09-15 LAB — CBC
HCT: 47.7 % (ref 39.0–52.0)
HCT: 46.4 % (ref 39.0–52.0)
Hemoglobin: 15.9 g/dL (ref 13.0–17.0)
Hemoglobin: 16.3 g/dL (ref 13.0–17.0)
MCH: 30 pg (ref 26.0–34.0)
MCH: 30.4 pg (ref 26.0–34.0)
MCHC: 33.3 g/dL (ref 30.0–36.0)
MCHC: 35.1 g/dL (ref 30.0–36.0)
MCV: 90 fL (ref 80.0–100.0)
MCV: 86.4 fL (ref 80.0–100.0)
Platelets: 164 10*3/uL (ref 150–400)
Platelets: 148 10*3/uL — ABNORMAL LOW (ref 150–400)
RBC: 5.3 MIL/uL (ref 4.22–5.81)
RBC: 5.37 MIL/uL (ref 4.22–5.81)
RDW: 15.3 % (ref 11.5–15.5)
RDW: 15.2 % (ref 11.5–15.5)
WBC: 16.2 10*3/uL — ABNORMAL HIGH (ref 4.0–10.5)
WBC: 15.9 10*3/uL — ABNORMAL HIGH (ref 4.0–10.5)
nRBC: 0 % (ref 0.0–0.2)
nRBC: 0 % (ref 0.0–0.2)

## 2021-09-15 LAB — LIPASE, BLOOD: Lipase: 28 U/L (ref 11–51)

## 2021-09-15 MED ORDER — ONDANSETRON 4 MG PO TBDP
4.0000 mg | ORAL_TABLET | Freq: Once | ORAL | Status: AC
  Administered 2021-09-15: 4 mg via ORAL
  Filled 2021-09-15: qty 1

## 2021-09-15 MED ORDER — LACTATED RINGERS IV BOLUS
1000.0000 mL | Freq: Once | INTRAVENOUS | Status: AC
  Administered 2021-09-15: 1000 mL via INTRAVENOUS

## 2021-09-15 MED ORDER — ONDANSETRON HCL 4 MG PO TABS: 4.0000 mg | ORAL_TABLET | Freq: Four times a day (QID) | ORAL | 0 refills | Status: AC

## 2021-09-15 MED ORDER — ONDANSETRON HCL 4 MG/2ML IJ SOLN
4.0000 mg | Freq: Once | INTRAMUSCULAR | Status: AC
  Administered 2021-09-15: 4 mg via INTRAVENOUS
  Filled 2021-09-15: qty 2

## 2021-09-15 MED ORDER — ACETAMINOPHEN 500 MG PO TABS
1000.0000 mg | ORAL_TABLET | Freq: Once | ORAL | Status: AC
  Administered 2021-09-15: 1000 mg via ORAL
  Filled 2021-09-15: qty 2

## 2021-09-15 MED ORDER — OXYCODONE HCL 5 MG PO TABS
5.0000 mg | ORAL_TABLET | Freq: Once | ORAL | Status: AC
  Administered 2021-09-15: 5 mg via ORAL
  Filled 2021-09-15: qty 1

## 2021-09-15 MED ORDER — OXYCODONE HCL 5 MG PO TABS: 5.0000 mg | ORAL_TABLET | ORAL | 0 refills | Status: AC | PRN

## 2021-09-15 NOTE — Discharge Instructions (Addendum)
Continue to hold home losartan given kidney dysfunction after discharge since blood pressures had remained normal during hospitalization off of this medication. Also hold your home Crestor after discharge due to elevated liver enzymes.  Will need close follow-up with primary care physician and oncology for follow-up regarding biopsy results from diffuse lymphadenopathy concerning for lymphoma and needs recheck basic metabolic panel in 1 week to ensure calcium and kidney function remained stable.

## 2021-09-15 NOTE — Progress Notes (Signed)
Mobility Specialist - Progress Note     09/15/21 1031  Mobility  Activity Ambulated with assistance in hallway  Level of Assistance Contact guard assist, steadying assist  Assistive Device Front wheel walker  Distance Ambulated (ft) 90 ft  Activity Response Tolerated well  $Mobility charge 1 Mobility   Pt was found in bed and agreeable to mobilize. Pt had no complaints during ambulation and at EOS took a seated rest break due to fatigue. Pt was left in bathroom with all necessities in reach.  Ferd Hibbs Mobility Specialist

## 2021-09-15 NOTE — TOC Transition Note (Signed)
Transition of Care [facility]) - CM/SW Discharge Note   Patient Details  Name: Buckley Bradly MRN: [id number] Date of Birth: December 06, 1960  [facility] CM/SW Contact:  Roseanne Kaufman, RN Phone Number: 09/15/2021, 9:39 AM   Clinical Narrative:   No TOC needs at this time, please consult if any needs arise. TOC signing off    Final next level of care: Home/Self Care Barriers to Discharge: No Barriers Identified   Patient Goals and CMS Choice Patient states their goals for this hospitalization and ongoing recovery are:: fell better   Choice offered to / list presented to : NA  Discharge Placement                       Discharge Plan and Services In-house Referral: NA Discharge Planning Services: CM Consult Post Acute Care Choice: NA          DME Arranged: N/A DME Agency: NA       HH Arranged: NA HH Agency: NA        Social Determinants of Health (SDOH) Interventions     Readmission Risk Interventions     No data to display

## 2021-09-15 NOTE — ED Triage Notes (Signed)
Patient having abdominal distention.  Was just discharged from [facility] long after admission for lethargy fatigue chills.  Discharged with follow up with oncology due to extensive adenopathy and had a biopsy at [facility].

## 2021-09-15 NOTE — Plan of Care (Signed)
  Problem: Education: Goal: Knowledge of General Education information will improve Description: Including pain rating scale, medication(s)/side effects and non-pharmacologic comfort measures Outcome: Progressing   Problem: Clinical Measurements: Goal: Diagnostic test results will improve Outcome: Progressing   Problem: Coping: Goal: Level of anxiety will decrease Outcome: Progressing   Problem: Safety: Goal: Ability to remain free from injury will improve Outcome: Progressing   

## 2021-09-15 NOTE — Discharge Summary (Signed)
Physician Discharge Summary  Thomas Waller JAS:[id number] DOB: 1960/11/26 DOA: 09/11/2021  PCP: Unk Pinto, MD  Admit date: 09/11/2021 Discharge date: 09/15/2021  Admitted From: Home Disposition: Home  Recommendations for Outpatient Follow-up:  Follow up with PCP in 1-2 weeks Follow-up with medical oncology in 1 week Please obtain CMP in one week to ensure calcium level, creatinine, and LFTs are stable Discontinue home losartan due to AKI, blood pressures remain normal off this medication during hospitalization Discontinued home Crestor due to transaminitis Please follow up on the following pending results: Lymph node biopsy results  Home Health: No Equipment/Devices: None  Discharge Condition: Will CODE STATUS: Full code Diet recommendation: Heart healthy diet  History of present illness:  Thomas Waller is a 61 y.o. male with past medical history significant for rheumatoid arthritis, cholelithiasis, gout, HLD, HTN who presented to [facility] ED on 8/27 with fatigue, decreased appetite, nausea, chills, lower abdominal pain, unexplained weight loss of 20 pounds.   In the ED, temperature 98.0 F, HR 94, RR 17, BP 122/79, SPO2 94% on room air.  WBC 15.4, hemoglobin 16.4, platelets 193.  Sodium 133, potassium 4.8, chloride 95, CO2 32, glucose 85, BUN 39, creatinine 1.68.  Calcium 16.9.  Alkaline phosphatase 177, AST 45, ALT 55, total bilirubin 1.0.  Lipase 45.  WBC 15.4, hemoglobin 16.4, platelets 193.  High sensitive troponin 15.  COVID-19 PCR negative.  Influenza A/B PCR negative.  Chest x-ray with linear and right basilar opacities, platelike features suggested possibility of atelectasis.  CT abdomen/pelvis showed extensive intra-abdominal and retroperitoneal lymphadenopathy.  CT chest with contrast showed extensive areas of lymphadenopathy of the neck, chest, abdomen.  Patient suspected with new onset lymphoma.  Oncology consulted.  Duration consulted for admission for management of  hypercalcemia of malignancy and further work-up for likely lymphoma.  Hospital course:  Hypercalcemia malignancy Patient presenting to ED with weakness, decreased appetite and fatigue.  Calcium level elevated 16.9, most likely secondary to lymphoma.  Patient was started on IV fluids, subcutaneous calcitonin and Zometa.  Calcium improved to 11.5 at time of discharge.  Recommend repeat BMP 1 week.   Suspected lymphoma Patient presenting with fatigue, decreased appetite, nausea/chills, lower abdominal pain and unexplained weight loss of 20 pounds.  CT abdomen/pelvis showed extensive intra-abdominal and retroperitoneal lymphadenopathy.  CT chest with contrast showed extensive areas of lymphadenopathy of the neck, chest, abdomen.  Patient underwent ultrasound-guided biopsy of right inguinal lymph node by interventional radiology on 8/29.  Medical oncology, Dr. Alen Blew was consulted and followed during hospital course.  Pathology was pending at time of discharge and needs close follow-up with medical oncology for results and determination of treatment course.   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  Baseline creatinine 1.2-1.5.  Creatinine stable, 1.91 at time of discharge.  Recommend repeat BMP 1 week.  Home losartan discontinued.   Elevated liver enzymes Etiology likely associated with lymphoma.  Holding Crestor on discharge.  Recommend LFTs 1 week.   Leukocytosis Etiology likely reactive or secondary to lymphoma.   Essential hypertension Holding losartan due to AKI, currently normotensive.  Will discontinue on discharge and outpatient follow-up with PCP.   Hyperlipidemia Holding home statin due to transaminitis   Idiopathic gout Continue home allopurinol 300 mg p.o. daily   Morbid obesity Body mass index is 36.22 kg/m.  Discussed with patient needs for aggressive lifestyle  changes/weight loss as this complicates all facets of  care.  Outpatient follow-up with PCP.  Discharge Diagnoses:  Principal Problem:   Hypercalcemia of malignancy Active Problems:   Hypertension   Hyperlipidemia, mixed   Idiopathic gout   CKD (chronic kidney disease) stage 3, GFR 30-59 ml/min (HCC)   AKI (acute kidney injury) ([place])   Class 2 obesity    Discharge Instructions  Discharge Instructions     Ambulatory referral to Hematology / Oncology   Complete by: As directed    Seen by Dr. Alen Blew inpatient   Call MD for:  difficulty breathing, headache or visual disturbances   Complete by: As directed    Call MD for:  extreme fatigue   Complete by: As directed    Call MD for:  persistant dizziness or light-headedness   Complete by: As directed    Call MD for:  persistant nausea and vomiting   Complete by: As directed    Call MD for:  severe uncontrolled pain   Complete by: As directed    Call MD for:  temperature >100.4   Complete by: As directed    Diet - low sodium heart healthy   Complete by: As directed    Increase activity slowly   Complete by: As directed    No wound care   Complete by: As directed       Allergies as of 09/15/2021       Reactions   Zestril [lisinopril] Cough        Medication List     STOP taking these medications    losartan 50 MG tablet Commonly known as: COZAAR   rosuvastatin 20 MG tablet Commonly known as: CRESTOR       TAKE these medications    allopurinol 300 MG tablet Commonly known as: ZYLOPRIM Take 300 mg by mouth daily.   ascorbic acid 500 MG tablet Commonly known as: VITAMIN C Take 500 mg by mouth 2 (two) times daily.   aspirin EC 81 MG tablet Take 81 mg by mouth daily.   B-D 3CC LUER-LOK SYR 21GX1" 21G X 1" 3 ML Misc Generic drug: SYRINGE-NEEDLE (DISP) 3 ML Use 1 syringe with injection   cyanocobalamin 1000 MCG tablet Commonly known as: VITAMIN B12 Take 1,000 mcg by mouth daily.   Enbrel SureClick  50 MG/ML injection Generic drug: etanercept Inject 50 mg into the skin once a week.   Fish Oil 1200 MG Caps Take by mouth 2 (two) times daily.   fluocinonide ointment 0.05 % Commonly known as: LIDEX Apply 1 Application topically 2 (two) times daily.   Magnesium 500 MG Tabs Take 1 tablet by mouth daily.   multivitamin tablet Take 1 tablet by mouth daily.   PreviDent 5000 Plus 1.1 % Crea dental cream Generic drug: sodium fluoride Place 1 Application onto teeth at bedtime.   testosterone cypionate 200 MG/ML injection Commonly known as: DEPOTESTOSTERONE CYPIONATE INJECT 1 ML INTRAMUSCULARLY EVERY 7 DAYS. DISCARD THE REMAINDER. What changed: See the new instructions.   VITAMIN D PO Take 5,000 Units by mouth 2 (two) times daily.   Zinc 50 MG Tabs Take 1 tablet by mouth daily.        Follow-up Information     Unk Pinto, MD. Schedule an appointment as soon as possible for a visit in 1 week(s).   Specialty: Internal Medicine Contact information: [address] [place] [address] [phone]         Wyatt Portela, MD. Schedule an appointment as soon as possible for a visit in 1  week(s).   Specialty: Oncology Contact information: [place] [address] [phone]                Allergies  Allergen Reactions   Zestril [Lisinopril] Cough    Consultations: Medical oncology, Dr. Alen Blew Interventional radiology   Procedures/Studies: [place] CORE BIOPSY (LYMPH NODES)  Result Date: 09/13/2021 INDICATION: Lymphadenopathy.  No malignant history. EXAM: ULTRASOUND-GUIDED RIGHT INGUINAL LYMPH NODE BIOPSY. COMPARISON:  CT AP, 09/11/2021. MEDICATIONS: None ANESTHESIA/SEDATION: Local anesthetic was administered. COMPLICATIONS: None immediate. TECHNIQUE: Informed written consent was obtained from the patient and/or patient's representative after a discussion of the risks, benefits and alternatives to treatment. Questions  regarding the procedure were encouraged and answered. Initial ultrasound scanning demonstrated pathologically-enlarged RIGHT inguinal lymph nodes. An ultrasound image was saved for documentation purposes. The procedure was planned. A timeout was performed prior to the initiation of the procedure. The operative was prepped and draped in the usual sterile fashion, and a sterile drape was applied covering the operative field. A timeout was performed prior to the initiation of the procedure. Local anesthesia was provided with 1% lidocaine with epinephrine. Under direct ultrasound guidance, an 18 gauge core needle device was utilized to obtain to obtain 3 core needle biopsies of the RIGHT inguinal lymph node. The samples were placed in saline and submitted to pathology. The needle was removed and hemostasis was achieved with manual compression. Post procedure scan was negative for significant hematoma. A dressing was placed. The patient tolerated the procedure well without immediate postprocedural complication. IMPRESSION: Successful ultrasound guided biopsy of an enlarged RIGHT lymph node, as above. Michaelle Birks, MD [facility] Radiology Electronically Signed   By: Michaelle Birks M.D.   On: 09/13/2021 17:16   ECHOCARDIOGRAM COMPLETE  Result Date: 09/13/2021    ECHOCARDIOGRAM REPORT   Patient Name:   MIKA ANASTASI Date of Exam: 09/13/2021 Medical Rec #:  [id number]       Height:       70.0 in Accession #:    [id number]      Weight:       252.4 lb Date of Birth:  02-Apr-1960       BSA:          2.304 m Patient Age:    71 years        BP:           123/81 mmHg Patient Gender: M               HR:           85 bpm. Exam Location:  Inpatient Procedure: 2D Echo, Cardiac Doppler and Color Doppler Indications:    CAD  History:        Patient has no prior history of Echocardiogram examinations.                 Risk Factors:Hypertension and Dyslipidemia.  Sonographer:    Memory Argue Referring Phys: [id number] [place]  1. Left ventricular ejection fraction, by estimation, is 65 to 70%. The left ventricle has normal function. Left ventricular endocardial border not optimally defined to evaluate regional wall motion. There is moderate concentric left ventricular hypertrophy. Left ventricular diastolic parameters are consistent with Grade I diastolic dysfunction (impaired relaxation).  2. Right ventricular systolic function is normal. The right ventricular size is normal. There is mildly elevated pulmonary artery systolic pressure. The estimated right ventricular systolic pressure is 53.9 mmHg.  3. The mitral valve  is normal in structure. No evidence of mitral valve regurgitation. No evidence of mitral stenosis.  4. The aortic valve is tricuspid. There is mild thickening of the aortic valve. Aortic valve regurgitation is not visualized. Aortic valve sclerosis is present, with no evidence of aortic valve stenosis.  5. The inferior vena cava is normal in size with greater than 50% respiratory variability, suggesting right atrial pressure of 3 mmHg. Comparison(s): No prior Echocardiogram. FINDINGS  Left Ventricle: Left ventricular ejection fraction, by estimation, is 65 to 70%. The left ventricle has normal function. Left ventricular endocardial border not optimally defined to evaluate regional wall motion. The left ventricular internal cavity size was normal in size. There is moderate concentric left ventricular hypertrophy. Left ventricular diastolic parameters are consistent with Grade I diastolic dysfunction (impaired relaxation). Right Ventricle: The right ventricular size is normal. No increase in right ventricular wall thickness. Right ventricular systolic function is normal. There is mildly elevated pulmonary artery systolic pressure. The tricuspid regurgitant velocity is 3.03  m/s, and with an assumed right atrial pressure of 3 mmHg, the estimated right ventricular  systolic pressure is 78.4 mmHg. Left Atrium: Left atrial size was normal in size. Right Atrium: Right atrial size was normal in size. Pericardium: There is no evidence of pericardial effusion. Mitral Valve: The mitral valve is normal in structure. No evidence of mitral valve regurgitation. No evidence of mitral valve stenosis. Tricuspid Valve: The tricuspid valve is normal in structure. Tricuspid valve regurgitation is not demonstrated. No evidence of tricuspid stenosis. Aortic Valve: The aortic valve is tricuspid. There is mild thickening of the aortic valve. There is mild aortic valve annular calcification. Aortic valve regurgitation is not visualized. Aortic valve sclerosis is present, with no evidence of aortic valve  stenosis. Aortic valve mean gradient measures 9.0 mmHg. Aortic valve peak gradient measures 14.1 mmHg. Aortic valve area, by VTI measures 2.77 cm. Pulmonic Valve: The pulmonic valve was normal in structure. Pulmonic valve regurgitation is mild. No evidence of pulmonic stenosis. Aorta: The aortic root is normal in size and structure. Venous: The inferior vena cava is normal in size with greater than 50% respiratory variability, suggesting right atrial pressure of 3 mmHg. IAS/Shunts: No atrial level shunt detected by color flow Doppler.  LEFT VENTRICLE PLAX 2D LVIDd:         4.50 cm   Diastology LVIDs:         2.90 cm   LV e' medial:    9.68 cm/s LV PW:         1.20 cm   LV E/e' medial:  6.1 LV IVS:        1.20 cm   LV e' lateral:   13.30 cm/s LVOT diam:     2.10 cm   LV E/e' lateral: 4.4 LV SV:         98 LV SV Index:   42 LVOT Area:     3.46 cm  RIGHT VENTRICLE TAPSE (M-mode): 1.8 cm LEFT ATRIUM             Index        RIGHT ATRIUM           Index LA diam:        3.30 cm 1.43 cm/m   RA Area:     14.60 cm LA Vol (A2C):   51.3 ml 22.26 ml/m  RA Volume:   35.50 ml  15.41 ml/m LA Vol (A4C):   65.2 ml 28.29 ml/m LA Biplane Vol: 63.1  ml 27.38 ml/m  AORTIC VALVE AV Area (Vmax):    2.67 cm AV  Area (Vmean):   2.65 cm AV Area (VTI):     2.77 cm AV Vmax:           188.00 cm/s AV Vmean:          140.000 cm/s AV VTI:            0.353 m AV Peak Grad:      14.1 mmHg AV Mean Grad:      9.0 mmHg LVOT Vmax:         145.00 cm/s LVOT Vmean:        107.000 cm/s LVOT VTI:          0.282 m LVOT/AV VTI ratio: 0.80  AORTA Ao Root diam: 3.30 cm MITRAL VALVE               TRICUSPID VALVE MV Area (PHT): 3.42 cm    TR Peak grad:   36.7 mmHg MV Decel Time: 222 msec    TR Vmax:        303.00 cm/s MV E velocity: 58.90 cm/s MV A velocity: 69.70 cm/s  SHUNTS MV E/A ratio:  0.85        Systemic VTI:  0.28 m                            Systemic Diam: 2.10 cm Rudean Haskell MD Electronically signed by Rudean Haskell MD Signature Date/Time: 09/13/2021/3:58:26 PM    Final    US RENAL  Result Date: 09/13/2021 CLINICAL DATA:  Acute kidney injury. EXAM: RENAL / URINARY TRACT ULTRASOUND COMPLETE COMPARISON:  None Available. FINDINGS: Right Kidney: Renal measurements: 11.6 x 5.4 x 5.9 cm = volume: 195 mL. Echogenicity within normal limits. No mass or hydronephrosis visualized. Left Kidney: Renal measurements: 12.0 x 5.9 x 6.9 cm = volume: 255 mL. Echogenicity within normal limits. No mass or hydronephrosis visualized. Bladder: Appears normal for degree of bladder distention. Other: None. IMPRESSION: Unremarkable study. No evidence for hydronephrosis or increased renal echogenicity to suggest medical renal disease. Electronically Signed   By: Misty Stanley M.D.   On: 09/13/2021 15:49   CT Head Wo Contrast  Result Date: 09/12/2021 CLINICAL DATA:  Vomiting and lethargy. EXAM: CT HEAD WITHOUT CONTRAST TECHNIQUE: Contiguous axial images were obtained from the base of the skull through the vertex without intravenous contrast. RADIATION DOSE REDUCTION: This exam was performed according to the departmental dose-optimization program which includes automated exposure control, adjustment of the mA and/or kV according to patient  size and/or use of iterative reconstruction technique. COMPARISON:  None Available. FINDINGS: Brain: No evidence of acute infarction, hemorrhage, hydrocephalus, extra-axial collection or mass lesion/mass effect. Vascular: No hyperdense vessel or unexpected calcification. Skull: Normal. Negative for fracture or focal lesion. Sinuses/Orbits: No acute finding. Other: None. IMPRESSION: No acute intracranial pathology. Electronically Signed   By: Virgina Norfolk M.D.   On: 09/12/2021 00:40   CT Chest W Contrast  Result Date: 09/12/2021 CLINICAL DATA:  Right pleural effusion and possible lymphoma seen on earlier abdomen and pelvis CT. EXAM: CT CHEST WITH CONTRAST TECHNIQUE: Multidetector CT imaging of the chest was performed during intravenous contrast administration. RADIATION DOSE REDUCTION: This exam was performed according to the departmental dose-optimization program which includes automated exposure control, adjustment of the mA and/or kV according to patient size and/or use of iterative reconstruction technique. CONTRAST:  18m OMNIPAQUE IOHEXOL 300 MG/ML  SOLN COMPARISON:  None Available. FINDINGS: Cardiovascular: It should be noted that evaluation of the vascular structures is markedly limited secondary to suboptimal opacification with intravenous contrast. There is mild to moderate severity calcification of the aortic arch, without evidence of aortic aneurysm. Normal heart size with marked severity coronary artery calcification. No pericardial effusion. Mediastinum/Nodes: Numerous enlarged pretracheal and bilateral axillary lymph nodes are seen. The largest pretracheal lymph node measures approximately 2.7 cm x 1.5 cm (axial CT image 33, CT series 2). Numerous smaller AP window, subcarinal and bilateral hilar lymph nodes are noted (the largest is seen within the region of the AP window and measures 1.3 cm x 0.9 cm. Enlarged lymph nodes are also seen within the visualized neck soft tissues, posterior to the  proximal portions of the bilateral clavicles (the largest measures 2.8 cm x 1.7 cm/axial CT image 5, CT series 2). Thyroid gland, trachea, and esophagus demonstrate no significant findings. Lungs/Pleura: Moderate severity inferior right upper lobe, right middle lobe, lingular and bilateral lower lobe linear atelectasis is noted. There are small bilateral pleural effusions, right slightly greater than left. No pneumothorax is identified. Upper Abdomen: Numerous enlarged mesenteric lymph nodes are seen within the visualized portion of the upper abdomen. Musculoskeletal: Multilevel degenerative changes seen throughout the thoracic spine. IMPRESSION: 1. Extensive areas of lymphadenopathy within the neck, chest and visualized portion of the abdomen, consistent with a lymphoproliferative disorder. Correlation with tissue sampling is recommended. 2. Moderate severity bilateral linear atelectasis with small bilateral pleural effusions, right slightly greater than left. 3. Marked severity coronary artery disease. Aortic Atherosclerosis (ICD10-I70.0). Electronically Signed   By: Virgina Norfolk M.D.   On: 09/12/2021 00:39   CT ABDOMEN PELVIS W CONTRAST  Result Date: 09/11/2021 CLINICAL DATA:  Right-sided abdominal pain EXAM: CT ABDOMEN AND PELVIS WITH CONTRAST TECHNIQUE: Multidetector CT imaging of the abdomen and pelvis was performed using the standard protocol following bolus administration of intravenous contrast. RADIATION DOSE REDUCTION: This exam was performed according to the departmental dose-optimization program which includes automated exposure control, adjustment of the mA and/or kV according to patient size and/or use of iterative reconstruction technique. CONTRAST:  60m OMNIPAQUE IOHEXOL 300 MG/ML  SOLN COMPARISON:  02/04/2018. FINDINGS: Lower chest: Lung bases demonstrate mild atelectatic changes. Small right-sided pleural effusion is seen. Hepatobiliary: No focal liver abnormality is seen. No gallstones,  gallbladder wall thickening, or biliary dilatation. Cholelithiasis seen on prior ultrasound is not well appreciated on this exam Pancreas: Pancreas appears within normal limits. Spleen: Normal in size without focal abnormality. Adrenals/Urinary Tract: Adrenal glands are within normal limits bilaterally. Kidneys demonstrate a normal enhancement pattern. No calculi or obstructive changes are seen. The bladder is partially distended. Stomach/Bowel: No obstructive or inflammatory changes of the colon are seen. Scattered diverticular changes noted. The appendix is partially visualized. No definitive Peri appendiceal inflammatory changes to suggest appendicitis are noted. Vascular/Lymphatic: Vascular calcifications of the abdominal aorta are noted. Diffuse retroperitoneal and intra-abdominal adenopathy is identified. Index node in the portacaval space measures 3.3 cm in short axis. Multiple Peri aortic, pericaval and intra-aortocaval nodes are seen. Index node on image number 51 of series 2 measures 2.4 cm in short axis. Large iliac nodal masses are seen measuring 3.7 cm in short axis on the left and 2.5 cm in short axis on the right. Inguinal adenopathy is noted. Dominant node measures 3 cm in short axis on the left. Scattered smaller lymph nodes are seen. Reproductive: Prostate is unremarkable. Other: Mild free fluid is noted within the  abdomen along the liver margin and scattered throughout the pelvis. Musculoskeletal: Degenerative changes of lumbar spine are noted. No acute bony abnormality is seen. IMPRESSION: Extensive intra-abdominal and retroperitoneal adenopathy as described. These changes are most consistent with lymphoma till proven otherwise. Tissue sampling is recommended for further evaluation. Be performed in the left inguinal region without difficulty. Appendix shows no findings to suggest appendicitis. Mild atelectatic changes with small right-sided pleural effusion. Previously seen gallstones are not  well appreciated on this exam. Electronically Signed   By: Inez Catalina M.D.   On: 09/11/2021 21:15   DG Chest 2 View  Result Date: 09/11/2021 CLINICAL DATA:  Weakness.  Fever. EXAM: CHEST - 2 VIEW COMPARISON:  None Available. FINDINGS: Right basilar and left lingular opacities, somewhat platelike. No pneumothorax. No nodules or masses. The cardiomediastinal silhouette is normal. IMPRESSION: Lingular and right basilar opacities. The platelike nature suggests the possibility of atelectasis. However, given history of fever, pneumonia is not excluded. Recommend short-term follow-up to ensure resolution. Electronically Signed   By: Dorise Bullion III M.D.   On: 09/11/2021 16:51     Subjective: Patient seen examined bedside, resting comfortably.  Sleeping but easily arousable.  Calcium level continues to improve, down to 11.5 this morning.  Medical oncology, Dr. Alen Blew okay for discharge home and outpatient follow-up for results of lymph node biopsy and further determination of treatment plan.  Patient with no other questions or concerns at this time.  Denies headache, no dizziness, no chest pain, no palpitations, no shortness of breath, no abdominal pain, no cough/congestion, no fever/chills/night sweats, no nausea/vomiting/diarrhea.  No acute events overnight per nursing staff.  Discharge Exam: Vitals:   09/14/21 2254 09/15/21 0429  BP: (!) 144/79 107/71  Pulse: 99 100  Resp: 18 20  Temp: 98 F (36.7 C) 97.9 F (36.6 C)  SpO2: 91% 90%   Vitals:   09/14/21 1324 09/14/21 1423 09/14/21 2254 09/15/21 0429  BP: 139/82 136/74 (!) 144/79 107/71  Pulse: 96 90 99 100  Resp: '16 20 18 20  '$ Temp: 99.1 F (37.3 C) 97.8 F (36.6 C) 98 F (36.7 C) 97.9 F (36.6 C)  TempSrc: Oral Oral    SpO2: 90% 94% 91% 90%  Weight:      Height:        Physical Exam: GEN: NAD, alert and oriented x 3, obese HEENT: NCAT, PERRL, EOMI, sclera clear, MMM PULM: CTAB w/o wheezes/crackles, normal respiratory effort,  on room air CV: RRR w/o M/G/R GI: abd soft, NTND, NABS, no R/G/M MSK: no peripheral edema, muscle strength globally intact 5/5 bilateral upper/lower extremities NEURO: CN II-XII intact, no focal deficits, sensation to light touch intact PSYCH: normal mood/affect Integumentary: dry/intact, no rashes or wounds    The results of significant diagnostics from this hospitalization (including imaging, microbiology, ancillary and laboratory) are listed below for reference.     Microbiology: Recent Results (from the past 240 hour(s))  Resp Panel by RT-PCR (Flu A&B, Covid) Anterior Nasal Swab     Status: None   Collection Time: 09/11/21  7:23 PM   Specimen: Anterior Nasal Swab  Result Value Ref Range Status   SARS Coronavirus 2 by RT PCR NEGATIVE NEGATIVE Final    Comment: (NOTE) SARS-CoV-2 target nucleic acids are NOT DETECTED.  The SARS-CoV-2 RNA is generally detectable in upper respiratory specimens during the acute phase of infection. The lowest concentration of SARS-CoV-2 viral copies this assay can detect is 138 copies/mL. A negative result does not preclude SARS-Cov-2 infection  and should not be used as the sole basis for treatment or other patient management decisions. A negative result may occur with  improper specimen collection/handling, submission of specimen other than nasopharyngeal swab, presence of viral mutation(s) within the areas targeted by this assay, and inadequate number of viral copies(<138 copies/mL). A negative result must be combined with clinical observations, patient history, and epidemiological information. The expected result is Negative.  Fact Sheet for Patients:  EntrepreneurPulse.com.au  Fact Sheet for Healthcare Providers:  IncredibleEmployment.be  This test is no t yet approved or cleared by the [place] FDA and  has been authorized for detection and/or diagnosis of SARS-CoV-2 by FDA under an Emergency Use  Authorization (EUA). This EUA will remain  in effect (meaning this test can be used) for the duration of the COVID-19 declaration under Section 564(b)(1) of the Act, 21 U.S.C.section 360bbb-3(b)(1), unless the authorization is terminated  or revoked sooner.       Influenza A by PCR NEGATIVE NEGATIVE Final   Influenza B by PCR NEGATIVE NEGATIVE Final    Comment: (NOTE) The Xpert Xpress SARS-CoV-2/FLU/RSV plus assay is intended as an aid in the diagnosis of influenza from Nasopharyngeal swab specimens and should not be used as a sole basis for treatment. Nasal washings and aspirates are unacceptable for Xpert Xpress SARS-CoV-2/FLU/RSV testing.  Fact Sheet for Patients: EntrepreneurPulse.com.au  Fact Sheet for Healthcare Providers: IncredibleEmployment.be  This test is not yet approved or cleared by the [place] FDA and has been authorized for detection and/or diagnosis of SARS-CoV-2 by FDA under an Emergency Use Authorization (EUA). This EUA will remain in effect (meaning this test can be used) for the duration of the COVID-19 declaration under Section 564(b)(1) of the Act, 21 U.S.C. section 360bbb-3(b)(1), unless the authorization is terminated or revoked.  Performed at [facility], [address], [place], [place] [address]      Labs: BNP (last 3 results) No results for input(s): "BNP" in the last 8760 hours. Basic Metabolic Panel: Recent Labs  Lab 09/12/21 1322 09/12/21 1846 09/13/21 0442 09/14/21 0417 09/15/21 0432  NA 131* 134* 134* 134* 131*  K 4.9 4.9 5.0 4.8 4.9  CL 99 102 104 105 104  CO2 '25 25 25 23 22  '$ GLUCOSE 92 105* 102* 93 90  BUN 42* 44* 46* 52* 51*  CREATININE 1.90* 1.83* 1.91* 2.06* 1.91*  CALCIUM 14.7* 13.7* 12.7* 12.0* 11.5*  MG 2.0  --   --   --   --   PHOS 5.2*  --   --   --   --    Liver Function Tests: Recent Labs  Lab 09/08/21 1221 09/11/21 1923 09/13/21 0442  09/14/21 0417 09/15/21 0432  AST 54* 45* 46* 50* 58*  ALT 66* 55* 52* 57* 65*  ALKPHOS  --  177* 176* 191* 234*  BILITOT 1.0 1.0 0.8 0.9 1.1  PROT 6.2 6.1* 5.4* 5.2* 5.5*  ALBUMIN  --  3.5 2.7* 2.6* 2.7*   Recent Labs  Lab 09/11/21 1923  LIPASE 45   No results for input(s): "AMMONIA" in the last 168 hours. CBC: Recent Labs  Lab 09/08/21 1221 09/11/21 1923 09/12/21 1322 09/13/21 0442 09/14/21 0417 09/15/21 0432  WBC 11.7* 15.4* 15.8* 16.1* 14.6* 16.2*  NEUTROABS 7,523 9.8* 10.7*  --   --   --   HGB 17.0 16.4 16.8 15.4 15.5 15.9  HCT 49.5 47.3 50.6 46.2 46.4 47.7  MCV 88.2 86.5 90.8 90.2 91.7 90.0  PLT 192 193  170 163 158 164   Cardiac Enzymes: Recent Labs  Lab 09/12/21 1846  CKTOTAL 21*   BNP: Invalid input(s): "POCBNP" CBG: No results for input(s): "GLUCAP" in the last 168 hours. D-Dimer No results for input(s): "DDIMER" in the last 72 hours. Hgb A1c No results for input(s): "HGBA1C" in the last 72 hours. Lipid Profile No results for input(s): "CHOL", "HDL", "LDLCALC", "TRIG", "CHOLHDL", "LDLDIRECT" in the last 72 hours. Thyroid function studies No results for input(s): "TSH", "T4TOTAL", "T3FREE", "THYROIDAB" in the last 72 hours.  Invalid input(s): "FREET3" Anemia work up No results for input(s): "VITAMINB12", "FOLATE", "FERRITIN", "TIBC", "IRON", "RETICCTPCT" in the last 72 hours. Urinalysis    Component Value Date/Time   COLORURINE YELLOW 09/12/2021 0031   APPEARANCEUR CLEAR 09/12/2021 0031   LABSPEC 1.032 (H) 09/12/2021 0031   PHURINE 5.0 09/12/2021 0031   GLUCOSEU NEGATIVE 09/12/2021 0031   HGBUR NEGATIVE 09/12/2021 0031   BILIRUBINUR NEGATIVE 09/12/2021 0031   KETONESUR NEGATIVE 09/12/2021 0031   PROTEINUR NEGATIVE 09/12/2021 0031   NITRITE NEGATIVE 09/12/2021 0031   LEUKOCYTESUR NEGATIVE 09/12/2021 0031   Sepsis Labs Recent Labs  Lab 09/12/21 1322 09/13/21 0442 09/14/21 0417 09/15/21 0432  WBC 15.8* 16.1* 14.6* 16.2*    Microbiology Recent Results (from the past 240 hour(s))  Resp Panel by RT-PCR (Flu A&B, Covid) Anterior Nasal Swab     Status: None   Collection Time: 09/11/21  7:23 PM   Specimen: Anterior Nasal Swab  Result Value Ref Range Status   SARS Coronavirus 2 by RT PCR NEGATIVE NEGATIVE Final    Comment: (NOTE) SARS-CoV-2 target nucleic acids are NOT DETECTED.  The SARS-CoV-2 RNA is generally detectable in upper respiratory specimens during the acute phase of infection. The lowest concentration of SARS-CoV-2 viral copies this assay can detect is 138 copies/mL. A negative result does not preclude SARS-Cov-2 infection and should not be used as the sole basis for treatment or other patient management decisions. A negative result may occur with  improper specimen collection/handling, submission of specimen other than nasopharyngeal swab, presence of viral mutation(s) within the areas targeted by this assay, and inadequate number of viral copies(<138 copies/mL). A negative result must be combined with clinical observations, patient history, and epidemiological information. The expected result is Negative.  Fact Sheet for Patients:  EntrepreneurPulse.com.au  Fact Sheet for Healthcare Providers:  IncredibleEmployment.be  This test is no t yet approved or cleared by the [place] FDA and  has been authorized for detection and/or diagnosis of SARS-CoV-2 by FDA under an Emergency Use Authorization (EUA). This EUA will remain  in effect (meaning this test can be used) for the duration of the COVID-19 declaration under Section 564(b)(1) of the Act, 21 U.S.C.section 360bbb-3(b)(1), unless the authorization is terminated  or revoked sooner.       Influenza A by PCR NEGATIVE NEGATIVE Final   Influenza B by PCR NEGATIVE NEGATIVE Final    Comment: (NOTE) The Xpert Xpress SARS-CoV-2/FLU/RSV plus assay is intended as an aid in the diagnosis of influenza  from Nasopharyngeal swab specimens and should not be used as a sole basis for treatment. Nasal washings and aspirates are unacceptable for Xpert Xpress SARS-CoV-2/FLU/RSV testing.  Fact Sheet for Patients: EntrepreneurPulse.com.au  Fact Sheet for Healthcare Providers: IncredibleEmployment.be  This test is not yet approved or cleared by the [place] FDA and has been authorized for detection and/or diagnosis of SARS-CoV-2 by FDA under an Emergency Use Authorization (EUA). This EUA will remain in effect (meaning this test can  be used) for the duration of the COVID-19 declaration under Section 564(b)(1) of the Act, 21 U.S.C. section 360bbb-3(b)(1), unless the authorization is terminated or revoked.  Performed at [facility], [address] [place], [place] [address]      Time coordinating discharge: Over 30 minutes  SIGNED:   Donnamarie Poag [place] DO  [facility] 09/15/2021, 9:29 AM

## 2021-09-15 NOTE — Progress Notes (Signed)
Hospital follow up  Assessment and Plan: Hospital visit follow up for:   Thomas Waller was seen today for hospitalization follow-up.  Diagnoses and all orders for this visit:  Hypercalcemia of malignancy/Lymphoma, unspecified body region, unspecified lymphoma type ([place]) Follow up with Dr. Jillene Bucks on 10/05/2021 If no pain medication is given at that appointment notify our office for refill  AKI (acute kidney injury) ([place]) Continue to push fluids Related to lymphoma Follow with Dr. Osker Mason  Elevated liver enzymes Related to lymphoma Follow with Dr Osker Mason  Leukocytosis, unspecified type Follow with Dr. Osker Mason  Essential hypertension BP remains controlled with D/c of losartan - continue DASH diet, exercise and monitor at home. Call if greater than 130/80.   Hyperlipidemia, mixed Continue diet and exercise as tolerated         All medications were reviewed with patient and family and fully reconciled. All questions answered fully, and patient and family members were encouraged to call the office with any further questions or concerns. Discussed goal to avoid readmission related to this diagnosis.  There are no discontinued medications.    Over 40 minutes of exam, counseling, chart review, and complex, high/moderate level critical decision making was performed this visit.   Future Appointments  Date Time Provider [place]  09/22/2021 11:00 AM Wyatt Portela, MD [facility] None  02/08/2022  9:30 AM Alycia Rossetti, NP [facility] None  04/05/2022  9:00 AM Alycia Rossetti, NP [facility] None      Today's Vitals   09/16/21 1006  BP: 138/80  Pulse: 93  Resp: 17  Temp: 97.9 F (36.6 C)  SpO2: 97%  Weight: 249 lb (112.9 kg)  Height: '5\' 10"'$  (1.778 m)   Body mass index is 35.73 kg/m.   BMI is Body mass index is 35.73 kg/m., he has not been working on diet and exercise. Wt Readings from Last 3 Encounters:  09/16/21 249 lb (112.9 kg)  09/15/21 252 lb (114.3 kg)   09/12/21 252 lb 6.4 oz (114.5 kg)   He is having abdominal pain which can become severe , was prescribed Oxycodone and Zofran in the ER last night. He was given a dose of oxycodone in the hospital and it did help his pain. He also continues to have a lot of abdominal bloating and nausea.  Zofran did help in the ER.   HPI 61 y.o.male presents for follow up for transition from recent hospitalization or SNIF stay. Admit date to the hospital was 09/15/21, patient was discharged from the hospital on 09/15/21 and our clinical staff contacted the office the day after discharge to set up a follow up appointment. The discharge summary, medications, and diagnostic test results were reviewed before meeting with the patient. The patient was admitted for:  Recommendations for Outpatient Follow-up:  Follow up with PCP in 1-2 weeks Follow-up with medical oncology in 1 week Please obtain CMP in one week to ensure calcium level, creatinine, and LFTs are stable Discontinue home losartan due to AKI, blood pressures remain normal off this medication during hospitalization Discontinued home Crestor due to transaminitis Please follow up on the following pending results: Lymph node biopsy results   Hypercalcemia malignancy Patient presenting to ED with weakness, decreased appetite and fatigue.  Calcium level elevated 16.9, most likely secondary to lymphoma.  Patient was started on IV fluids, subcutaneous calcitonin and Zometa.  Calcium improved to 11.5 at time of discharge.  Recommend repeat BMP 1 week.   Suspected lymphoma Patient presenting with fatigue, decreased appetite,  nausea/chills, lower abdominal pain and unexplained weight loss of 20 pounds.  CT abdomen/pelvis showed extensive intra-abdominal and retroperitoneal lymphadenopathy.  CT chest with contrast showed extensive areas of lymphadenopathy of the neck, chest, abdomen.  Patient underwent ultrasound-guided biopsy of right inguinal lymph node by  interventional radiology on 8/29.  Medical oncology, Dr. Alen Blew was consulted and followed during hospital course.  Pathology was pending at time of discharge and needs close follow-up with medical oncology for results and determination of treatment course.   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  Baseline creatinine 1.2-1.5.  Creatinine stable, 1.91 at time of discharge.  Recommend repeat BMP 1 week.  Home losartan discontinued.   Elevated liver enzymes Etiology likely associated with lymphoma.  Holding Crestor on discharge.  Recommend LFTs 1 week.   Leukocytosis Etiology likely reactive or secondary to lymphoma.   Essential hypertension Holding losartan due to AKI, currently normotensive.  Will discontinue on discharge and outpatient follow-up with PCP.   Hyperlipidemia Holding home statin due to transaminitis   Idiopathic gout Continue home allopurinol 300 mg p.o. daily   Morbid obesity Body mass index is 36.22 kg/m.  Discussed with patient needs for aggressive lifestyle changes/weight loss as this complicates all facets of care.  Outpatient follow-up with PCP.      Images while in the hospital: CT Head Wo Contrast  Result Date: 09/12/2021 CLINICAL DATA:  Vomiting and lethargy. EXAM: CT HEAD WITHOUT CONTRAST TECHNIQUE: Contiguous axial images were obtained from the base of the skull through the vertex without intravenous contrast. RADIATION DOSE REDUCTION: This exam was performed according to the departmental dose-optimization program which includes automated exposure control, adjustment of the mA and/or kV according to patient size and/or use of iterative reconstruction technique. COMPARISON:  None Available. FINDINGS: Brain: No evidence of acute infarction, hemorrhage, hydrocephalus, extra-axial collection or mass lesion/mass effect. Vascular: No hyperdense vessel or  unexpected calcification. Skull: Normal. Negative for fracture or focal lesion. Sinuses/Orbits: No acute finding. Other: None. IMPRESSION: No acute intracranial pathology. Electronically Signed   By: Virgina Norfolk M.D.   On: 09/12/2021 00:40   CT Chest W Contrast  Result Date: 09/12/2021 CLINICAL DATA:  Right pleural effusion and possible lymphoma seen on earlier abdomen and pelvis CT. EXAM: CT CHEST WITH CONTRAST TECHNIQUE: Multidetector CT imaging of the chest was performed during intravenous contrast administration. RADIATION DOSE REDUCTION: This exam was performed according to the departmental dose-optimization program which includes automated exposure control, adjustment of the mA and/or kV according to patient size and/or use of iterative reconstruction technique. CONTRAST:  60m OMNIPAQUE IOHEXOL 300 MG/ML  SOLN COMPARISON:  None Available. FINDINGS: Cardiovascular: It should be noted that evaluation of the vascular structures is markedly limited secondary to suboptimal opacification with intravenous contrast. There is mild to moderate severity calcification of the aortic arch, without evidence of aortic aneurysm. Normal heart size with marked severity coronary artery calcification. No pericardial effusion. Mediastinum/Nodes: Numerous enlarged pretracheal and bilateral axillary lymph nodes are seen. The largest pretracheal lymph node measures approximately 2.7 cm x 1.5 cm (axial CT image 33, CT series 2). Numerous smaller AP window, subcarinal and bilateral hilar lymph nodes are noted (the largest is seen within the region of the AP window and measures 1.3 cm x 0.9 cm. Enlarged lymph nodes are also seen within the visualized neck soft tissues, posterior to the proximal portions of the bilateral clavicles (the largest  measures 2.8 cm x 1.7 cm/axial CT image 5, CT series 2). Thyroid gland, trachea, and esophagus demonstrate no significant findings. Lungs/Pleura: Moderate severity inferior right upper  lobe, right middle lobe, lingular and bilateral lower lobe linear atelectasis is noted. There are small bilateral pleural effusions, right slightly greater than left. No pneumothorax is identified. Upper Abdomen: Numerous enlarged mesenteric lymph nodes are seen within the visualized portion of the upper abdomen. Musculoskeletal: Multilevel degenerative changes seen throughout the thoracic spine. IMPRESSION: 1. Extensive areas of lymphadenopathy within the neck, chest and visualized portion of the abdomen, consistent with a lymphoproliferative disorder. Correlation with tissue sampling is recommended. 2. Moderate severity bilateral linear atelectasis with small bilateral pleural effusions, right slightly greater than left. 3. Marked severity coronary artery disease. Aortic Atherosclerosis (ICD10-I70.0). Electronically Signed   By: Virgina Norfolk M.D.   On: 09/12/2021 00:39   CT ABDOMEN PELVIS W CONTRAST  Result Date: 09/11/2021 CLINICAL DATA:  Right-sided abdominal pain EXAM: CT ABDOMEN AND PELVIS WITH CONTRAST TECHNIQUE: Multidetector CT imaging of the abdomen and pelvis was performed using the standard protocol following bolus administration of intravenous contrast. RADIATION DOSE REDUCTION: This exam was performed according to the departmental dose-optimization program which includes automated exposure control, adjustment of the mA and/or kV according to patient size and/or use of iterative reconstruction technique. CONTRAST:  89m OMNIPAQUE IOHEXOL 300 MG/ML  SOLN COMPARISON:  02/04/2018. FINDINGS: Lower chest: Lung bases demonstrate mild atelectatic changes. Small right-sided pleural effusion is seen. Hepatobiliary: No focal liver abnormality is seen. No gallstones, gallbladder wall thickening, or biliary dilatation. Cholelithiasis seen on prior ultrasound is not well appreciated on this exam Pancreas: Pancreas appears within normal limits. Spleen: Normal in size without focal abnormality.  Adrenals/Urinary Tract: Adrenal glands are within normal limits bilaterally. Kidneys demonstrate a normal enhancement pattern. No calculi or obstructive changes are seen. The bladder is partially distended. Stomach/Bowel: No obstructive or inflammatory changes of the colon are seen. Scattered diverticular changes noted. The appendix is partially visualized. No definitive Peri appendiceal inflammatory changes to suggest appendicitis are noted. Vascular/Lymphatic: Vascular calcifications of the abdominal aorta are noted. Diffuse retroperitoneal and intra-abdominal adenopathy is identified. Index node in the portacaval space measures 3.3 cm in short axis. Multiple Peri aortic, pericaval and intra-aortocaval nodes are seen. Index node on image number 51 of series 2 measures 2.4 cm in short axis. Large iliac nodal masses are seen measuring 3.7 cm in short axis on the left and 2.5 cm in short axis on the right. Inguinal adenopathy is noted. Dominant node measures 3 cm in short axis on the left. Scattered smaller lymph nodes are seen. Reproductive: Prostate is unremarkable. Other: Mild free fluid is noted within the abdomen along the liver margin and scattered throughout the pelvis. Musculoskeletal: Degenerative changes of lumbar spine are noted. No acute bony abnormality is seen. IMPRESSION: Extensive intra-abdominal and retroperitoneal adenopathy as described. These changes are most consistent with lymphoma till proven otherwise. Tissue sampling is recommended for further evaluation. Be performed in the left inguinal region without difficulty. Appendix shows no findings to suggest appendicitis. Mild atelectatic changes with small right-sided pleural effusion. Previously seen gallstones are not well appreciated on this exam. Electronically Signed   By: MInez CatalinaM.D.   On: 09/11/2021 21:15   DG Chest 2 View  Result Date: 09/11/2021 CLINICAL DATA:  Weakness.  Fever. EXAM: CHEST - 2 VIEW COMPARISON:  None Available.  FINDINGS: Right basilar and left lingular opacities, somewhat platelike. No pneumothorax. No nodules or masses.  The cardiomediastinal silhouette is normal. IMPRESSION: Lingular and right basilar opacities. The platelike nature suggests the possibility of atelectasis. However, given history of fever, pneumonia is not excluded. Recommend short-term follow-up to ensure resolution. Electronically Signed   By: Dorise Bullion III M.D.   On: 09/11/2021 16:51     Current Outpatient Medications (Endocrine & Metabolic):    testosterone cypionate (DEPOTESTOSTERONE CYPIONATE) 200 MG/ML injection, INJECT 1 ML INTRAMUSCULARLY EVERY 7 DAYS. DISCARD THE REMAINDER. (Patient taking differently: Inject 200 mg into the muscle once a week. Discard any remainder.)    Current Outpatient Medications (Analgesics):    allopurinol (ZYLOPRIM) 300 MG tablet, Take 300 mg by mouth daily.    aspirin EC 81 MG tablet, Take 81 mg by mouth daily.   ENBREL SURECLICK 50 MG/ML injection, Inject 50 mg into the skin once a week.   oxyCODONE (ROXICODONE) 5 MG immediate release tablet, Take 1-2 tablets (5-10 mg total) by mouth every 4 (four) hours as needed for up to 5 days for severe pain.  Current Outpatient Medications (Hematological):    vitamin B-12 (CYANOCOBALAMIN) 1000 MCG tablet, Take 1,000 mcg by mouth daily.  Current Outpatient Medications (Other):    Cholecalciferol (VITAMIN D PO), Take 5,000 Units by mouth 2 (two) times daily.   fluocinonide ointment (LIDEX) 1.24 %, Apply 1 Application topically 2 (two) times daily.   Magnesium 500 MG TABS, Take 1 tablet by mouth daily.   Multiple Vitamin (MULTIVITAMIN) tablet, Take 1 tablet by mouth daily.   Omega-3 Fatty Acids (FISH OIL) 1200 MG CAPS, Take by mouth 2 (two) times daily.   ondansetron (ZOFRAN) 4 MG tablet, Take 1 tablet (4 mg total) by mouth every 6 (six) hours for 30 doses.   PREVIDENT 5000 PLUS 1.1 % CREA dental cream, Place 1 Application onto teeth at bedtime.    SYRINGE-NEEDLE, DISP, 3 ML (B-D 3CC LUER-LOK SYR 21GX1") 21G X 1" 3 ML MISC, Use 1 syringe with injection   vitamin C (ASCORBIC ACID) 500 MG tablet, Take 500 mg by mouth 2 (two) times daily.   Zinc 50 MG TABS, Take 1 tablet by mouth daily.  Past Medical History:  Diagnosis Date   Arthritis    [place] Polyarthritis   Cholelithiases 02/04/2018   Per Abd [place] 01/2018 with fatty liver, no inflammation or obstruction   Elevated hemoglobin A1c measurement    Gout    Hyperlipidemia    Hypertension    Rheumatoid arthritis (HCC)      Allergies  Allergen Reactions   Zestril [Lisinopril] Cough    ROS: all negative except above.   Physical Exam: Filed Weights   09/16/21 1006  Weight: 249 lb (112.9 kg)   BP 138/80   Pulse 93   Temp 97.9 F (36.6 C)   Resp 17   Ht '5\' 10"'$  (1.778 m)   Wt 249 lb (112.9 kg)   SpO2 97%   BMI 35.73 kg/m  General Appearance: Appears fatigued and in pain, in no apparent distress. Eyes: PERRLA, EOMs, conjunctiva no swelling or erythema Sinuses: No Frontal/maxillary tenderness ENT/Mouth: Ext aud canals clear, TMs without erythema, bulging. No erythema, swelling, or exudate on post pharynx.  Tonsils not swollen or erythematous. Hearing normal.  Neck: Supple, thyroid normal.  Respiratory: Respiratory effort normal, BS equal bilaterally without rales, rhonchi, wheezing or stridor.  Cardio: RRR with no MRGs. Brisk peripheral pulses without edema.  Abdomen: Firm,bloated + BS, Tenderness LLQ Lymphatics: Non tender without lymphadenopathy.  Musculoskeletal: Full ROM, 5/5 strength, normal gait.  Skin: Warm, dry without rashes, lesions, ecchymosis.  Neuro: Cranial nerves intact. Normal muscle tone, no cerebellar symptoms. Sensation intact.  Psych: Awake and oriented X 3, normal affect, Insight and Judgment appropriate.     Alycia Rossetti, NP 10:09 AM [facility]

## 2021-09-15 NOTE — ED Provider Notes (Signed)
[facility] EMERGENCY DEPARTMENT Provider Note   CSN: [id number] Arrival date & time: 09/15/21  1638     History Chief Complaint  Patient presents with   Bloated   Lymphadenopathy    HPI Thomas Waller is a 61 y.o. male presenting for malaise and fatigue.  Notably, this patient was just admitted at an outside hospital found to have hypercalcemia thought to be secondary to lymphoma.  He has had chronic pain over the last week.  He required frequent pain medication in the hospital as well as nausea medications.  He states that he was discharged this morning without either and states that as soon as he got home he realized that he was not going to be able to tolerate his ongoing care at home.  He denies fevers or chills syncope or shortness of breath.  He feels grossly uncomfortable.  He has been unable to eat anything since he was discharged and feels dehydrated.  His pain is diffuse all throughout his abdomen into his pelvis.  He stated has not changed tonight.   Patient's recorded medical, surgical, social, medication list and allergies were reviewed in the Snapshot window as part of the initial history.   Review of Systems   Review of Systems  Constitutional:  Positive for fatigue. Negative for chills and fever.  HENT:  Negative for ear pain and sore throat.   Eyes:  Negative for pain and visual disturbance.  Respiratory:  Negative for cough and shortness of breath.   Cardiovascular:  Negative for chest pain and palpitations.  Gastrointestinal:  Positive for abdominal pain and nausea. Negative for vomiting.  Genitourinary:  Negative for dysuria and hematuria.  Musculoskeletal:  Positive for back pain. Negative for arthralgias.  Skin:  Negative for color change and rash.  Neurological:  Positive for weakness. Negative for seizures and syncope.  All other systems reviewed and are negative.   Physical Exam Updated Vital Signs BP (!) 140/84   Pulse 83   Temp  98.8 F (37.1 C) (Oral)   Resp (!) 22   Ht '5\' 10"'$  (1.778 m)   Wt 114.3 kg   SpO2 92%   BMI 36.16 kg/m  Physical Exam Vitals and nursing note reviewed.  Constitutional:      General: He is not in acute distress.    Appearance: He is well-developed.  HENT:     Head: Normocephalic and atraumatic.  Eyes:     Conjunctiva/sclera: Conjunctivae normal.  Cardiovascular:     Rate and Rhythm: Normal rate and regular rhythm.     Heart sounds: No murmur heard. Pulmonary:     Effort: Pulmonary effort is normal. No respiratory distress.     Breath sounds: Normal breath sounds.  Abdominal:     Palpations: Abdomen is soft.     Tenderness: There is abdominal tenderness. There is no guarding.  Musculoskeletal:        General: No swelling.     Cervical back: Neck supple.  Skin:    General: Skin is warm and dry.     Capillary Refill: Capillary refill takes less than 2 seconds.  Neurological:     Mental Status: He is alert.  Psychiatric:        Mood and Affect: Mood normal.      ED Course/ Medical Decision Making/ A&P    Procedures Procedures   Medications Ordered in ED Medications  lactated ringers bolus 1,000 mL (0 mLs Intravenous Stopped 09/15/21 2130)  ondansetron (ZOFRAN) injection 4  mg (4 mg Intravenous Given 09/15/21 2015)  oxyCODONE (Oxy IR/ROXICODONE) immediate release tablet 5 mg (5 mg Oral Given 09/15/21 2014)  acetaminophen (TYLENOL) tablet 1,000 mg (1,000 mg Oral Given 09/15/21 2014)  oxyCODONE (Oxy IR/ROXICODONE) immediate release tablet 5 mg (5 mg Oral Given 09/15/21 2300)  ondansetron (ZOFRAN-ODT) disintegrating tablet 4 mg (4 mg Oral Given 09/15/21 2300)    Medical Decision Making:    Thomas Waller is a 61 y.o. male who presented to the ED today with fatigue and weakness as well as ongoing abdominal pain detailed above.     Patient's presentation is complicated by their history of recently diagnosed lymphoma, ongoing hypercalcemia.  Patient placed on continuous  vitals and telemetry monitoring while in ED which was reviewed periodically.   Complete initial physical exam performed, notably the patient  was hemodynamically stable in no acute distress.      Reviewed and confirmed nursing documentation for past medical history, family history, social history.    Initial Assessment:   Patient's history of present illness and physical exam findings are most consistent with nonspecific etiology, musculoskeletal pain.  Patient had a an opiate requirement while in the hospital to tolerate his symptoms as well as an antiemetic requirement.  He states that none of these were prescribed for him to take home and after being home for only a few hours, he realized his pain and nausea or vomiting or uncontrolled without those medications. Considered but do not favor intra-abdominal infection such as appendicitis or cholecystitis, small bowel obstruction or any other acute pathology at this time.  Discussed with patient and he would like to proceed with screening laboratory evaluation to evaluate for reversible cause of his symptoms and plan for reassessment after administration of his medications that he required inpatient. This is most consistent with an acute life/limb threatening illness complicated by underlying chronic conditions.  Initial Plan:  Screening labs including CBC and Metabolic panel to evaluate for infectious or metabolic etiology of disease.  Urinalysis with reflex culture ordered to evaluate for UTI or relevant urologic/nephrologic pathology.  CXR to evaluate for structural/infectious intrathoracic pathology.  EKG to evaluate for cardiac pathology. Objective evaluation as below reviewed with plan for close reassessment  Initial Study Results:   Laboratory  Labs with multiple abnormalities which were referred to the patient.  Continued leukocytosis, worsening liver irritation, and continued evidence of renal insufficiency. EKG EKG was reviewed  independently. Rate, rhythm, axis, intervals all examined and without medically relevant abnormality. ST segments without concerns for elevations.    Final Assessment and Plan:   I reevaluated patient at bedside after 7 hours in the emergency department.  At this point he has ambulated, tolerated p.o. intake and feels grossly symptomatically improved.  Had a prolonged shared medical decision making conversation with patient, patient's wife via phone call and patient's sister-in-law at bedside.  We discussed admission for his multiple laboratory abnormalities versus prescription of pain control and antiemetics with plan for close PCP evaluation.  Already scheduled for 10:30 AM tomorrow morning. We discussed his mild ascites and how it does not appear to represent SBP at this time but that that may be subject to change as his symptoms progress   After this prolonged conversation, family requested discharge with plan for close PCP follow-up.  Medications prescribed as patient has a nondiagnosed disease that is causing him significant distress and significant etiology for his pain visible on CT scan.  Antiemetics prescribed for tolerance of pain control.  Strict return precautions  reinforced and patient overall ambulatory tolerating p.o. intake at time of discharge.  Patient discharged with no further acute events.  Clinical Impression:  1. Lymphadenopathy   2. Abdominal bloating      Discharge   Final Clinical Impression(s) / ED Diagnoses Final diagnoses:  Lymphadenopathy  Abdominal bloating    Rx / DC Orders ED Discharge Orders          Ordered    oxyCODONE (ROXICODONE) 5 MG immediate release tablet  Every 4 hours PRN        09/15/21 2250    ondansetron (ZOFRAN) 4 MG tablet  Every 6 hours        09/15/21 2250              Tretha Sciara, MD 09/16/21 0004

## 2021-09-15 NOTE — ED Provider Triage Note (Signed)
Emergency Medicine Provider Triage Evaluation Note  Thomas Waller , a 61 y.o. male  was evaluated in triage.  Pt complains of abdominal pain. Was discharged from [facility] this morning after being admitted for hypercalcemia and lymphadenopathy, suspected lymphoma with biopsies pending. Expressed concerns about this before d/c. Pain and swelling has increased since d/c.  Review of Systems  Positive: Abd pain and swelling Negative:   Physical Exam  Ht '5\' 10"'$  (1.778 m)   Wt 114.3 kg   BMI 36.16 kg/m  Gen:   Awake, no distress   Resp:  Normal effort  MSK:   Moves extremities without difficulty  Other:    Medical Decision Making  Medically screening exam initiated at 4:49 PM.  Appropriate orders placed.  Thomas Waller was informed that the remainder of the evaluation will be completed by another provider, this initial triage assessment does not replace that evaluation, and the importance of remaining in the ED until their evaluation is complete.  Interventional radiology performed US guided biopsy of right inguinal lymph node on 8/29, pathology pending. Patient was to follow up with medical oncology for results and treatment course.    Kateri Plummer, PA-C 09/15/21 1649

## 2021-09-15 NOTE — Telephone Encounter (Signed)
Scheduled appt per 8/31 referral. Called pt, no answer. Left msg with appt date/time. Requested for pt to call back to confirm appt.  

## 2021-09-15 NOTE — Discharge Instructions (Addendum)
You were seen today for abdominal pain and bloating.  Your main concern was pain control so she left the hospital.  We have discussed options including observation,  repeating your CT scan, or outpatient pain control with plan to follow-up with primary care provider in the morning.  You elected for outpatient discharge with plan to follow-up in the morning.  I believe this is reasonable at this time.  Pain control sent to pharmacy and close follow-up strongly recommended.  If you begin to have fevers or chills, nausea or vomiting, syncope or shortness of breath.  Please return for ongoing care and management.  Thank you for the opportunity to participate in your care, Thomas Sciara MD

## 2021-09-16 ENCOUNTER — Telehealth: Payer: Self-pay | Admitting: Oncology

## 2021-09-16 ENCOUNTER — Other Ambulatory Visit: Payer: Self-pay | Admitting: Oncology

## 2021-09-16 ENCOUNTER — Ambulatory Visit (INDEPENDENT_AMBULATORY_CARE_PROVIDER_SITE_OTHER): Payer: BC Managed Care – PPO | Admitting: Nurse Practitioner

## 2021-09-16 ENCOUNTER — Encounter: Payer: Self-pay | Admitting: Nurse Practitioner

## 2021-09-16 DIAGNOSIS — N179 Acute kidney failure, unspecified: Secondary | ICD-10-CM

## 2021-09-16 DIAGNOSIS — I10 Essential (primary) hypertension: Secondary | ICD-10-CM

## 2021-09-16 DIAGNOSIS — C8590 Non-Hodgkin lymphoma, unspecified, unspecified site: Secondary | ICD-10-CM | POA: Diagnosis not present

## 2021-09-16 DIAGNOSIS — E782 Mixed hyperlipidemia: Secondary | ICD-10-CM

## 2021-09-16 DIAGNOSIS — D72829 Elevated white blood cell count, unspecified: Secondary | ICD-10-CM

## 2021-09-16 DIAGNOSIS — R748 Abnormal levels of other serum enzymes: Secondary | ICD-10-CM

## 2021-09-16 LAB — SURGICAL PATHOLOGY

## 2021-09-16 NOTE — Patient Instructions (Signed)
If you do not get pain medication filled at your appointment with Dr. Osker Mason on 10/11/2021 at 11 am send me a message.  Will make sure pain is controlled  Continue Oxycodone and Zofran  Pain Medicine Instructions You may need pain medicine after an injury or illness. There are many types of pain medicine. Two common types of pain medicine are: Non-opioid pain medicines. These include: Over-the-counter (OTC) medicines such as acetaminophen or non-steroidal anti-inflammatory medicines (NSAIDS). Prescription medicines such as gabapentin or pregabalin. Opioid prescription pain medicines. These include: Hydrocodone. Oxycodone. Tramadol. Pain medicine may be prescribed to relieve most or all of your pain. It may not be possible to make all of your pain go away, but you should be comfortable enough to move, breathe, and do normal activities. How can pain medicines affect me? Pain medicines can cause side effects such as: Nausea. Vomiting. Abdominal pain. Opioid pain medicines can cause additional side effects, such as: Constipation. Drowsiness. Confusion. Difficulty breathing (respiratory depression). Dependence and addiction (opioid use disorder). Using opioid pain medicines for longer than 3 days increases your risk of these side effects. Taking opioid pain medicine for a long period of time can affect your ability to do daily tasks. It also puts you at risk for: Motor vehicle accidents. Depression. Suicide. Heart attack. If they are not taken correctly, non-opioid and opioid medicines may also put you at risk for: Liver problems. Kidney problems. Overdose. This is taking too much of the medicine. It can sometimes lead to death. What actions can I take to lower my risk of problems? Know your pain treatment plan To manage your pain successfully, you and your health care provider need to understand each other and work together. To do this: Discuss the goals of your treatment, including  how much pain you might expect to have and how you will manage the pain. Ask your health care provider to refer you to one or more specialists who can help you manage pain in other ways. Some other ways of managing pain include physical therapy, exercise, massage, or biofeedback. Review the risks and benefits of taking pain medicines for your condition. Be honest about the amount of medicines you take, and about any drugs or alcohol you use. Get pain medicine prescriptions from only one health care provider. Keep all follow-up visits. This is important. Take your medicine as directed  Take your pain medicine exactly as told by your health care provider. Take it only when you need it. If your pain gets less severe, you may take less than your prescribed dose if your health care provider approves. If you are not having pain, do nottake pain medicine unless your health care provider tells you to take it. If your pain medicine contains acetaminophen, do not take any other acetaminophen while taking this medicine. Acetaminophen is found in many over-the-counter and prescription medicines. An overdose of acetaminophen can result in severe liver damage. If your pain is severe, do nottry to treat it yourself by taking more pills than instructed on your prescription. Contact your health care provider for help. Write down the times when you take your pain medicine. It is easy to become confused while on pain medicine. Writing the time can help you avoid overdose. Take other over-the-counter or prescription medicines only as told by your health care provider. Restrict your activity as directed While you are taking prescription pain medicine, and for 8 hours after your last dose, follow these instructions: Do not drive. Do not use machinery  or power tools. Do not sign legal documents. Do not drink alcohol. Do not take sleeping pills. Do not supervise children by yourself. Do not do activities that require  climbing or being in high places. Do not go to a lake, river, ocean, spa, or swimming pool unless an adult is nearby who can monitor and help you.  Keep pets and people safe Keep pain medicine in a locked cabinet, or in a secure area where children and pets cannot reach it. Never share your prescription pain medicine with anyone. Do not save any leftover pills. If you have leftover medicine, you can: Bring the medicine to a prescription take-back program. This is usually offered by the county or Event organiser. Bring it to a pharmacy that has a drug disposal container. Throw it out in the trash. Check the label or package insert of your medicine to see whether this is safe to do. If it is safe to throw it out, remove the medicine from the container and mix it with material that makes it unusable, such as pet waste, before putting it in the trash. Flush it down the toilet only if this is safe to do. To find out, check the label or package insert of your medicine. Information is also available at the [place] Food and Drug Administration website: TruckOr.si. Treat or prevent constipation Taking pain medicines increases your risk for constipation. To prevent or treat this problem: Drink enough water to keep your urine pale yellow. Take over-the-counter or prescription medicines. You may need to take stool softeners. Eat foods that are high in fiber, such as beans, whole grains, and fresh fruits and vegetables. Limit foods that are high in fat and processed sugars, such as fried or sweet foods. Contact a health care provider if: Your medicine is not relieving your pain. You have a rash. You have nausea and vomiting. You feel depressed. Get help right away if: You have difficulty breathing. Your breathing is slower or more shallow than normal. You feel confused. You are too sleepy or you have difficulty staying awake. Your skin or lips turn pale or bluish in color. Your tongue swells. You have  thoughts of harming yourself or harming others. These symptoms may represent a serious problem that is an emergency. Do not wait to see if the symptoms will go away. Get medical help right away. Call your local emergency services (911 in the [place] Do not drive yourself to the hospital. If you ever feel like you may hurt yourself or others, or have thoughts about taking your own life, get help right away. Go to your nearest emergency department or: Call your local emergency services (911 in the [place] Call the [facility] 8565295919 in the [place] Call a suicide crisis helpline, such as the [facility] at (850)192-7218 or 988 in the [place]. This is open 24 hours a day in the [place] Text the Crisis Text Line at 9054615336 (in the [place].). Summary It is important to follow instructions from your health care provider when you are taking prescription pain medicines. Pain medicine can help reduce pain but may also cause side effects. Make sure you understand the risks and benefits of taking pain medicine for your condition. Take steps to lower your risk of problems by taking medicine exactly as told, restricting activity, keeping others safe, preventing constipation, and having a pain treatment plan. This information is not intended to replace advice given to you by your health care provider. Make sure  you discuss any questions you have with your health care provider. Document Revised: 07/28/2020 Document Reviewed: 05/12/2020 Elsevier Patient Education  [place].

## 2021-09-16 NOTE — Telephone Encounter (Signed)
Spoke to pt who is aware of appt date/time.  

## 2021-09-20 ENCOUNTER — Inpatient Hospital Stay (HOSPITAL_COMMUNITY)
Admission: EM | Admit: 2021-09-20 | Discharge: 2021-10-16 | DRG: 682 | Disposition: E | Payer: BC Managed Care – PPO | Attending: Pulmonary Disease | Admitting: Pulmonary Disease

## 2021-09-20 ENCOUNTER — Inpatient Hospital Stay (HOSPITAL_COMMUNITY): Payer: BC Managed Care – PPO

## 2021-09-20 ENCOUNTER — Other Ambulatory Visit: Payer: Self-pay

## 2021-09-20 ENCOUNTER — Telehealth: Payer: Self-pay

## 2021-09-20 ENCOUNTER — Inpatient Hospital Stay: Payer: BC Managed Care – PPO | Attending: Oncology | Admitting: Oncology

## 2021-09-20 ENCOUNTER — Emergency Department (HOSPITAL_COMMUNITY): Payer: BC Managed Care – PPO

## 2021-09-20 ENCOUNTER — Encounter (HOSPITAL_COMMUNITY): Payer: Self-pay | Admitting: Emergency Medicine

## 2021-09-20 VITALS — BP 161/89 | HR 116 | Temp 97.8°F | Resp 19 | Ht 70.0 in

## 2021-09-20 DIAGNOSIS — E875 Hyperkalemia: Secondary | ICD-10-CM | POA: Diagnosis present

## 2021-09-20 DIAGNOSIS — J9811 Atelectasis: Secondary | ICD-10-CM | POA: Diagnosis present

## 2021-09-20 DIAGNOSIS — R609 Edema, unspecified: Secondary | ICD-10-CM | POA: Diagnosis not present

## 2021-09-20 DIAGNOSIS — D72829 Elevated white blood cell count, unspecified: Secondary | ICD-10-CM

## 2021-09-20 DIAGNOSIS — I700 Atherosclerosis of aorta: Secondary | ICD-10-CM | POA: Diagnosis not present

## 2021-09-20 DIAGNOSIS — M069 Rheumatoid arthritis, unspecified: Secondary | ICD-10-CM | POA: Diagnosis present

## 2021-09-20 DIAGNOSIS — J90 Pleural effusion, not elsewhere classified: Secondary | ICD-10-CM | POA: Diagnosis present

## 2021-09-20 DIAGNOSIS — Z9841 Cataract extraction status, right eye: Secondary | ICD-10-CM

## 2021-09-20 DIAGNOSIS — C8510 Unspecified B-cell lymphoma, unspecified site: Secondary | ICD-10-CM | POA: Diagnosis present

## 2021-09-20 DIAGNOSIS — G931 Anoxic brain damage, not elsewhere classified: Secondary | ICD-10-CM | POA: Diagnosis not present

## 2021-09-20 DIAGNOSIS — D479 Neoplasm of uncertain behavior of lymphoid, hematopoietic and related tissue, unspecified: Secondary | ICD-10-CM | POA: Diagnosis present

## 2021-09-20 DIAGNOSIS — C8590 Non-Hodgkin lymphoma, unspecified, unspecified site: Secondary | ICD-10-CM | POA: Diagnosis not present

## 2021-09-20 DIAGNOSIS — Z79899 Other long term (current) drug therapy: Secondary | ICD-10-CM

## 2021-09-20 DIAGNOSIS — E871 Hypo-osmolality and hyponatremia: Secondary | ICD-10-CM | POA: Diagnosis present

## 2021-09-20 DIAGNOSIS — E861 Hypovolemia: Secondary | ICD-10-CM | POA: Diagnosis present

## 2021-09-20 DIAGNOSIS — Z515 Encounter for palliative care: Secondary | ICD-10-CM | POA: Diagnosis not present

## 2021-09-20 DIAGNOSIS — R591 Generalized enlarged lymph nodes: Secondary | ICD-10-CM | POA: Diagnosis not present

## 2021-09-20 DIAGNOSIS — Z7982 Long term (current) use of aspirin: Secondary | ICD-10-CM

## 2021-09-20 DIAGNOSIS — E8770 Fluid overload, unspecified: Secondary | ICD-10-CM | POA: Diagnosis present

## 2021-09-20 DIAGNOSIS — N170 Acute kidney failure with tubular necrosis: Secondary | ICD-10-CM | POA: Diagnosis not present

## 2021-09-20 DIAGNOSIS — J9601 Acute respiratory failure with hypoxia: Secondary | ICD-10-CM | POA: Diagnosis not present

## 2021-09-20 DIAGNOSIS — G9340 Encephalopathy, unspecified: Secondary | ICD-10-CM

## 2021-09-20 DIAGNOSIS — R4020 Unspecified coma: Secondary | ICD-10-CM | POA: Diagnosis not present

## 2021-09-20 DIAGNOSIS — I469 Cardiac arrest, cause unspecified: Secondary | ICD-10-CM | POA: Diagnosis not present

## 2021-09-20 DIAGNOSIS — E860 Dehydration: Secondary | ICD-10-CM | POA: Diagnosis not present

## 2021-09-20 DIAGNOSIS — E162 Hypoglycemia, unspecified: Secondary | ICD-10-CM | POA: Diagnosis not present

## 2021-09-20 DIAGNOSIS — D696 Thrombocytopenia, unspecified: Secondary | ICD-10-CM | POA: Diagnosis not present

## 2021-09-20 DIAGNOSIS — Z4682 Encounter for fitting and adjustment of non-vascular catheter: Secondary | ICD-10-CM | POA: Diagnosis not present

## 2021-09-20 DIAGNOSIS — D6959 Other secondary thrombocytopenia: Secondary | ICD-10-CM | POA: Diagnosis present

## 2021-09-20 DIAGNOSIS — K5900 Constipation, unspecified: Secondary | ICD-10-CM | POA: Diagnosis present

## 2021-09-20 DIAGNOSIS — N182 Chronic kidney disease, stage 2 (mild): Secondary | ICD-10-CM | POA: Diagnosis present

## 2021-09-20 DIAGNOSIS — K3189 Other diseases of stomach and duodenum: Secondary | ICD-10-CM | POA: Diagnosis not present

## 2021-09-20 DIAGNOSIS — Z66 Do not resuscitate: Secondary | ICD-10-CM | POA: Diagnosis not present

## 2021-09-20 DIAGNOSIS — J690 Pneumonitis due to inhalation of food and vomit: Secondary | ICD-10-CM | POA: Diagnosis not present

## 2021-09-20 DIAGNOSIS — Z6835 Body mass index (BMI) 35.0-35.9, adult: Secondary | ICD-10-CM

## 2021-09-20 DIAGNOSIS — N179 Acute kidney failure, unspecified: Secondary | ICD-10-CM | POA: Diagnosis not present

## 2021-09-20 DIAGNOSIS — I468 Cardiac arrest due to other underlying condition: Secondary | ICD-10-CM | POA: Diagnosis not present

## 2021-09-20 DIAGNOSIS — G9341 Metabolic encephalopathy: Secondary | ICD-10-CM | POA: Diagnosis not present

## 2021-09-20 DIAGNOSIS — A419 Sepsis, unspecified organism: Secondary | ICD-10-CM | POA: Diagnosis not present

## 2021-09-20 DIAGNOSIS — R6521 Severe sepsis with septic shock: Secondary | ICD-10-CM | POA: Diagnosis not present

## 2021-09-20 DIAGNOSIS — R7989 Other specified abnormal findings of blood chemistry: Secondary | ICD-10-CM | POA: Diagnosis not present

## 2021-09-20 DIAGNOSIS — M549 Dorsalgia, unspecified: Secondary | ICD-10-CM | POA: Diagnosis not present

## 2021-09-20 DIAGNOSIS — R000 Tachycardia, unspecified: Secondary | ICD-10-CM | POA: Diagnosis not present

## 2021-09-20 DIAGNOSIS — R571 Hypovolemic shock: Secondary | ICD-10-CM | POA: Diagnosis not present

## 2021-09-20 DIAGNOSIS — I10 Essential (primary) hypertension: Secondary | ICD-10-CM | POA: Diagnosis not present

## 2021-09-20 DIAGNOSIS — E785 Hyperlipidemia, unspecified: Secondary | ICD-10-CM | POA: Diagnosis present

## 2021-09-20 DIAGNOSIS — I129 Hypertensive chronic kidney disease with stage 1 through stage 4 chronic kidney disease, or unspecified chronic kidney disease: Secondary | ICD-10-CM | POA: Diagnosis present

## 2021-09-20 DIAGNOSIS — M109 Gout, unspecified: Secondary | ICD-10-CM | POA: Diagnosis present

## 2021-09-20 DIAGNOSIS — E8809 Other disorders of plasma-protein metabolism, not elsewhere classified: Secondary | ICD-10-CM | POA: Diagnosis present

## 2021-09-20 DIAGNOSIS — R579 Shock, unspecified: Secondary | ICD-10-CM

## 2021-09-20 DIAGNOSIS — E872 Acidosis, unspecified: Secondary | ICD-10-CM | POA: Diagnosis not present

## 2021-09-20 DIAGNOSIS — R109 Unspecified abdominal pain: Secondary | ICD-10-CM | POA: Diagnosis not present

## 2021-09-20 DIAGNOSIS — R0902 Hypoxemia: Secondary | ICD-10-CM | POA: Diagnosis not present

## 2021-09-20 DIAGNOSIS — Z888 Allergy status to other drugs, medicaments and biological substances status: Secondary | ICD-10-CM

## 2021-09-20 DIAGNOSIS — Z9842 Cataract extraction status, left eye: Secondary | ICD-10-CM

## 2021-09-20 DIAGNOSIS — R627 Adult failure to thrive: Secondary | ICD-10-CM | POA: Diagnosis present

## 2021-09-20 DIAGNOSIS — R112 Nausea with vomiting, unspecified: Secondary | ICD-10-CM | POA: Diagnosis present

## 2021-09-20 DIAGNOSIS — M130 Polyarthritis, unspecified: Secondary | ICD-10-CM | POA: Diagnosis present

## 2021-09-20 DIAGNOSIS — Z8249 Family history of ischemic heart disease and other diseases of the circulatory system: Secondary | ICD-10-CM

## 2021-09-20 DIAGNOSIS — E8779 Other fluid overload: Secondary | ICD-10-CM | POA: Diagnosis not present

## 2021-09-20 LAB — URINALYSIS, ROUTINE W REFLEX MICROSCOPIC
Bilirubin Urine: NEGATIVE
Glucose, UA: NEGATIVE mg/dL
Hgb urine dipstick: NEGATIVE
Ketones, ur: NEGATIVE mg/dL
Leukocytes,Ua: NEGATIVE
Nitrite: NEGATIVE
Protein, ur: 30 mg/dL — AB
Specific Gravity, Urine: 1.018 (ref 1.005–1.030)
pH: 5 (ref 5.0–8.0)

## 2021-09-20 LAB — SODIUM, URINE, RANDOM: Sodium, Ur: <10 mmol/L

## 2021-09-20 LAB — CBC
HCT: 45 % (ref 39.0–52.0)
Hemoglobin: 16.2 g/dL (ref 13.0–17.0)
MCH: 30 pg (ref 26.0–34.0)
MCHC: 36 g/dL (ref 30.0–36.0)
MCV: 83.3 fL (ref 80.0–100.0)
Platelets: 67 10*3/uL — ABNORMAL LOW (ref 150–400)
RBC: 5.4 MIL/uL (ref 4.22–5.81)
RDW: 15 % (ref 11.5–15.5)
WBC: 22.8 10*3/uL — ABNORMAL HIGH (ref 4.0–10.5)
nRBC: 0.1 % (ref 0.0–0.2)

## 2021-09-20 LAB — COMPREHENSIVE METABOLIC PANEL
ALT: 68 U/L — ABNORMAL HIGH (ref 0–44)
AST: 62 U/L — ABNORMAL HIGH (ref 15–41)
Albumin: 2.6 g/dL — ABNORMAL LOW (ref 3.5–5.0)
Alkaline Phosphatase: 292 U/L — ABNORMAL HIGH (ref 38–126)
Anion gap: 11 (ref 5–15)
BUN: 105 mg/dL — ABNORMAL HIGH (ref 6–20)
CO2: 18 mmol/L — ABNORMAL LOW (ref 22–32)
Calcium: 11.2 mg/dL — ABNORMAL HIGH (ref 8.9–10.3)
Chloride: 97 mmol/L — ABNORMAL LOW (ref 98–111)
Creatinine, Ser: 3.86 mg/dL — ABNORMAL HIGH (ref 0.61–1.24)
GFR, Estimated: 17 mL/min — ABNORMAL LOW (ref >60)
Glucose, Bld: 85 mg/dL (ref 70–99)
Potassium: 6.1 mmol/L — ABNORMAL HIGH (ref 3.5–5.1)
Sodium: 126 mmol/L — ABNORMAL LOW (ref 135–145)
Total Bilirubin: 2.9 mg/dL — ABNORMAL HIGH (ref 0.3–1.2)
Total Protein: 5.2 g/dL — ABNORMAL LOW (ref 6.5–8.1)

## 2021-09-20 LAB — CREATININE, URINE, RANDOM: Creatinine, Urine: 162 mg/dL

## 2021-09-20 LAB — POTASSIUM: Potassium: 5.9 mmol/L — ABNORMAL HIGH (ref 3.5–5.1)

## 2021-09-20 LAB — OSMOLALITY, URINE: Osmolality, Ur: 346 mOsm/kg (ref 300–900)

## 2021-09-20 LAB — LACTATE DEHYDROGENASE: LDH: 571 U/L — ABNORMAL HIGH (ref 98–192)

## 2021-09-20 LAB — LIPASE, BLOOD: Lipase: 30 U/L (ref 11–51)

## 2021-09-20 MED ORDER — ACETAMINOPHEN 650 MG RE SUPP: 650.0000 mg | Freq: Four times a day (QID) | RECTAL | Status: DC | PRN

## 2021-09-20 MED ORDER — SODIUM CHLORIDE 0.9 % IV SOLN: INTRAVENOUS | Status: DC

## 2021-09-20 MED ORDER — DEXTROSE 50 % IV SOLN
1.0000 | Freq: Once | INTRAVENOUS | Status: AC
  Administered 2021-09-20: 50 mL via INTRAVENOUS
  Filled 2021-09-20: qty 50

## 2021-09-20 MED ORDER — ONDANSETRON HCL 4 MG/2ML IJ SOLN
4.0000 mg | Freq: Once | INTRAMUSCULAR | Status: AC
  Administered 2021-09-20: 4 mg via INTRAVENOUS
  Filled 2021-09-20: qty 2

## 2021-09-20 MED ORDER — HYDROMORPHONE HCL 2 MG/ML IJ SOLN
1.0000 mg | Freq: Once | INTRAMUSCULAR | Status: AC
  Administered 2021-09-20: 1 mg via INTRAVENOUS
  Filled 2021-09-20: qty 1

## 2021-09-20 MED ORDER — SODIUM BICARBONATE 8.4 % IV SOLN
50.0000 meq | Freq: Once | INTRAVENOUS | Status: AC
  Administered 2021-09-20: 50 meq via INTRAVENOUS
  Filled 2021-09-20: qty 50

## 2021-09-20 MED ORDER — ACETAMINOPHEN 325 MG PO TABS
650.0000 mg | ORAL_TABLET | Freq: Four times a day (QID) | ORAL | Status: DC | PRN
  Administered 2021-09-20 – 2021-09-21 (×2): 650 mg via ORAL
  Filled 2021-09-20 (×2): qty 2

## 2021-09-20 MED ORDER — TRAMADOL HCL 50 MG PO TABS
25.0000 mg | ORAL_TABLET | Freq: Four times a day (QID) | ORAL | Status: DC | PRN
  Administered 2021-09-20: 25 mg via ORAL
  Filled 2021-09-20: qty 1

## 2021-09-20 MED ORDER — SODIUM ZIRCONIUM CYCLOSILICATE 10 G PO PACK
10.0000 g | PACK | Freq: Once | ORAL | Status: DC
  Filled 2021-09-20: qty 1

## 2021-09-20 MED ORDER — INSULIN ASPART 100 UNIT/ML IV SOLN
5.0000 [IU] | Freq: Once | INTRAVENOUS | Status: AC
  Administered 2021-09-20: 5 [IU] via INTRAVENOUS
  Filled 2021-09-20: qty 0.05

## 2021-09-20 MED ORDER — SODIUM CHLORIDE 0.9 % IV BOLUS
1000.0000 mL | Freq: Once | INTRAVENOUS | Status: AC
  Administered 2021-09-20: 1000 mL via INTRAVENOUS

## 2021-09-20 MED ORDER — SODIUM ZIRCONIUM CYCLOSILICATE 10 G PO PACK
10.0000 g | PACK | ORAL | Status: AC
  Administered 2021-09-20 (×2): 10 g via ORAL
  Filled 2021-09-20: qty 1

## 2021-09-20 MED ORDER — ONDANSETRON HCL 4 MG/2ML IJ SOLN: 4.0000 mg | Freq: Four times a day (QID) | INTRAMUSCULAR | Status: DC | PRN

## 2021-09-20 MED ORDER — ALBUMIN HUMAN 25 % IV SOLN
12.5000 g | Freq: Four times a day (QID) | INTRAVENOUS | Status: AC
  Administered 2021-09-20 – 2021-09-21 (×2): 12.5 g via INTRAVENOUS
  Filled 2021-09-20 (×2): qty 50

## 2021-09-20 MED ORDER — ONDANSETRON HCL 4 MG PO TABS: 4.0000 mg | ORAL_TABLET | Freq: Four times a day (QID) | ORAL | Status: DC | PRN

## 2021-09-20 MED ORDER — ALBUTEROL SULFATE (2.5 MG/3ML) 0.083% IN NEBU
2.5000 mg | INHALATION_SOLUTION | RESPIRATORY_TRACT | Status: DC | PRN
  Administered 2021-09-20: 2.5 mg via RESPIRATORY_TRACT
  Filled 2021-09-20: qty 3

## 2021-09-20 NOTE — Progress Notes (Signed)
RT gave the Pt a breathing Tx. RT had concerns about the Pt's upper wheezing and swollen legs. RT notified the nurse and the doctor

## 2021-09-20 NOTE — ED Triage Notes (Signed)
BIBA Per EMS: Pt coming from [place] [facility] w/ c/o abd pain, N/V. Hx colon CA.

## 2021-09-20 NOTE — Consult Note (Signed)
[facility] Surgery Consult Note  Lyle Niblett 1960/02/02  [id number].    Requesting MD: Bonnielee Haff Chief Complaint/Reason for Consult: lymph node biopsy  HPI:  Thomas Waller is a 61 y.o. male PMH HTN, obesity BMI 35.73, gout, Reiter's polyarthritis, Rheumatoid arthritis on Enbrel who was recently hospitalized 8/28 through 8/31 with complaints of fatigue, nausea, abdominal pain, sweats, and unexplained weight loss.  He was found to have diffuse lymphadenopathy and underwent IR biopsy of right inguinal lymph node 8/29. Oncology, Dr. Alen Blew, has seen him. Unfortunately this biopsy was not completely diagnostic and suggestive of T-cell neoplasm. General surgery asked to see for excisional lymph node biopsy. He was sent to the ED today from oncology office with failure to thrive. He was found to have significant electrolyte abnormalities and worsening acute kidney injury with creatinine 3.86.   Family History  Problem Relation Age of Onset   Heart attack Half-Brother     Past Medical History:  Diagnosis Date   Arthritis    Reiters Polyarthritis   Cholelithiases 02/04/2018   Per Abd [place] 01/2018 with fatty liver, no inflammation or obstruction   Elevated hemoglobin A1c measurement    Gout    Hyperlipidemia    Hypertension    Rheumatoid arthritis [facility])     Past Surgical History:  Procedure Laterality Date   CATARACT EXTRACTION Right 04/2020   Dr. Tommy Rainwater   CATARACT EXTRACTION Left 05/2020   Dr. Tommy Rainwater   WISDOM TOOTH EXTRACTION      Social History:  reports that he has never smoked. He has never used smokeless tobacco. He reports current alcohol use. He reports that he does not use drugs.  Allergies:  Allergies  Allergen Reactions   Zestril [Lisinopril] Cough    (Not in a hospital admission)   Prior to Admission medications   Medication Sig Start Date End Date Taking? Authorizing Provider  allopurinol (ZYLOPRIM) 300 MG tablet Take 300 mg by mouth daily.    Yes  [provider]  aspirin EC 81 MG tablet Take 81 mg by mouth daily.   Yes [provider]  Cholecalciferol (VITAMIN D PO) Take 5,000 Units by mouth 2 (two) times daily.   Yes [provider]  ENBREL SURECLICK 50 MG/ML injection Inject 50 mg into the skin once a week. 10/13/13  Yes [provider]  fluocinonide ointment (LIDEX) 9.93 % Apply 1 Application topically 2 (two) times daily as needed (dermatitis). 10/06/15  Yes [provider]  Magnesium 500 MG TABS Take 1 tablet by mouth daily.   Yes [provider]  Multiple Vitamin (MULTIVITAMIN) tablet Take 1 tablet by mouth daily.   Yes [provider]  Omega-3 Fatty Acids (FISH OIL) 1200 MG CAPS Take by mouth 2 (two) times daily.   Yes [provider]  ondansetron (ZOFRAN) 4 MG tablet Take 1 tablet (4 mg total) by mouth every 6 (six) hours for 30 doses. 09/15/21 09-29-2021 Yes Countryman, Cheri Rous, MD  oxyCODONE (ROXICODONE) 5 MG immediate release tablet Take 1-2 tablets (5-10 mg total) by mouth every 4 (four) hours as needed for up to 5 days for severe pain. 09/15/21 10/08/2021 Yes Countryman, Cheri Rous, MD  PREVIDENT 5000 PLUS 1.1 % CREA dental cream Place 1 Application onto teeth at bedtime. 07/22/21  Yes [provider]  testosterone cypionate (DEPOTESTOSTERONE CYPIONATE) 200 MG/ML injection INJECT 1 ML INTRAMUSCULARLY EVERY 7 DAYS. DISCARD THE REMAINDER. Patient taking differently: Inject 200 mg into the muscle once a week. Discard any remainder. 06/14/21  Yes Alycia Rossetti, NP  vitamin B-12 (CYANOCOBALAMIN) 1000 MCG tablet Take 1,000 mcg by mouth daily.   Yes [provider]  vitamin C (ASCORBIC ACID) 500 MG tablet Take 500 mg by mouth 2 (two) times daily.   Yes [provider]  Zinc 50 MG TABS Take 1 tablet by mouth daily.   Yes [provider]  SYRINGE-NEEDLE, DISP, 3 ML (B-D 3CC LUER-LOK SYR 21GX1") 21G X 1" 3 ML MISC Use 1 syringe with injection 06/14/21    Alycia Rossetti, NP    Blood pressure 124/64, pulse 92, temperature (!) 97.5 F (36.4 C), temperature source Oral, resp. rate 18, SpO2 95 %. Physical Exam: General: somnolent but arousable HEENT: head is normocephalic, atraumatic.  Sclera are noninjected.  Pupils equal and round.  Ears and nose without any masses or lesions.  Mouth is dry Heart: regular, rate, and rhythm Lungs: Respiratory effort nonlabored Abd: soft, NT/ND, +BS, no masses, hernias, or organomegaly MS: BLE edema present Skin: some palpable lymph nodes b/l axilla with deep palpation Neuro: does arouse and follow commands  Results for orders placed or performed during the hospital encounter of 09/21/2021 (from the past 48 hour(s))  Lipase, blood     Status: None   Collection Time: 09/21/2021 11:47 AM  Result Value Ref Range   Lipase 30 11 - 51 U/L    Comment: Performed at [facility], [place] [address], [place], [place] [address]  Comprehensive metabolic panel     Status: Abnormal   Collection Time: 09/24/2021 11:47 AM  Result Value Ref Range   Sodium 126 (L) 135 - 145 mmol/L   Potassium 6.1 (H) 3.5 - 5.1 mmol/L   Chloride 97 (L) 98 - 111 mmol/L   CO2 18 (L) 22 - 32 mmol/L   Glucose, Bld 85 70 - 99 mg/dL    Comment: Glucose reference range applies only to samples taken after fasting for at least 8 hours.   BUN 105 (H) 6 - 20 mg/dL    Comment: RESULTS CONFIRMED BY MANUAL DILUTION   Creatinine, Ser 3.86 (H) 0.61 - 1.24 mg/dL   Calcium 11.2 (H) 8.9 - 10.3 mg/dL   Total Protein 5.2 (L) 6.5 - 8.1 g/dL   Albumin 2.6 (L) 3.5 - 5.0 g/dL   AST 62 (H) 15 - 41 U/L   ALT 68 (H) 0 - 44 U/L   Alkaline Phosphatase 292 (H) 38 - 126 U/L   Total Bilirubin 2.9 (H) 0.3 - 1.2 mg/dL   GFR, Estimated 17 (L) >60 mL/min    Comment: (NOTE) Calculated using the CKD-EPI Creatinine Equation (2021)    Anion gap 11 5 - 15    Comment: Performed at [facility], [place] [address] [place], [place] [address]   CBC     Status: Abnormal   Collection Time: 09/17/2021 11:47 AM  Result Value Ref Range   WBC 22.8 (H) 4.0 - 10.5 K/uL   RBC 5.40 4.22 - 5.81 MIL/uL   Hemoglobin 16.2 13.0 - 17.0 g/dL   HCT 45.0 39.0 - 52.0 %   MCV 83.3 80.0 - 100.0 fL   MCH 30.0 26.0 - 34.0 pg   MCHC 36.0 30.0 - 36.0 g/dL   RDW 15.0 11.5 - 15.5 %   Platelets 67 (L) 150 - 400 K/uL    Comment: SPECIMEN CHECKED FOR CLOTS Immature Platelet Fraction may be clinically indicated, consider ordering this additional test [id number] REPEATED TO VERIFY PLATELET COUNT CONFIRMED BY  SMEAR    nRBC 0.1 0.0 - 0.2 %    Comment: Performed at [facility], [place] [address] [place], [place] [address]  Lactate dehydrogenase     Status: Abnormal   Collection Time: 10/06/2021 11:47 AM  Result Value Ref Range   LDH 571 (H) 98 - 192 U/L    Comment: Performed at [facility], [place] [address], [place], [place] [address]   VAS [place] LOWER EXTREMITY VENOUS (DVT)  Result Date: 10/01/2021  Lower Venous DVT Study Patient Name:  MAVRIC CORTRIGHT  Date of Exam:   10/07/2021 Medical Rec #: [id number]        Accession #:    [id number] Date of Birth: Jun 19, 1960        Patient Gender: M Patient Age:   17 years Exam Location:  [facility] Procedure:      VAS [place] LOWER EXTREMITY VENOUS (DVT) Referring Phys: Bonnielee Haff --------------------------------------------------------------------------------  Indications: Edema.  Risk Factors: Cancer. Limitations: Poor ultrasound/tissue interface and body habitus. Comparison Study: No prior studies. Performing Technologist: Oliver Hum RVT  Examination Guidelines: A complete evaluation includes B-mode imaging, spectral Doppler, color Doppler, and power Doppler as needed of all accessible portions of each vessel. Bilateral testing is considered an integral part of a complete examination. Limited examinations for reoccurring indications may be performed as noted. The reflux portion of  the exam is performed with the patient in reverse Trendelenburg.  +---------+---------------+---------+-----------+----------+--------------+ RIGHT    CompressibilityPhasicitySpontaneityPropertiesThrombus Aging +---------+---------------+---------+-----------+----------+--------------+ CFV      Full           Yes      Yes                                 +---------+---------------+---------+-----------+----------+--------------+ SFJ      Full                                                        +---------+---------------+---------+-----------+----------+--------------+ FV Prox  Full                                                        +---------+---------------+---------+-----------+----------+--------------+ FV Mid   Full                                                        +---------+---------------+---------+-----------+----------+--------------+ FV Distal               Yes      Yes                                 +---------+---------------+---------+-----------+----------+--------------+ PFV      Full                                                        +---------+---------------+---------+-----------+----------+--------------+  POP      Full           Yes      Yes                                 +---------+---------------+---------+-----------+----------+--------------+ PTV      Full                                                        +---------+---------------+---------+-----------+----------+--------------+ PERO     Full                                                        +---------+---------------+---------+-----------+----------+--------------+   +---------+---------------+---------+-----------+----------+--------------+ LEFT     CompressibilityPhasicitySpontaneityPropertiesThrombus Aging +---------+---------------+---------+-----------+----------+--------------+ CFV      Full           Yes      Yes                                  +---------+---------------+---------+-----------+----------+--------------+ SFJ      Full                                                        +---------+---------------+---------+-----------+----------+--------------+ FV Prox  Full                                                        +---------+---------------+---------+-----------+----------+--------------+ FV Mid   Full                                                        +---------+---------------+---------+-----------+----------+--------------+ FV Distal               Yes      Yes                                 +---------+---------------+---------+-----------+----------+--------------+ PFV      Full                                                        +---------+---------------+---------+-----------+----------+--------------+ POP      Full           Yes      Yes                                 +---------+---------------+---------+-----------+----------+--------------+  PTV      Full                                                        +---------+---------------+---------+-----------+----------+--------------+ PERO     Full                                                        +---------+---------------+---------+-----------+----------+--------------+    Summary: RIGHT: - There is no evidence of deep vein thrombosis in the lower extremity. However, portions of this examination were limited- see technologist comments above.  - No cystic structure found in the popliteal fossa.  LEFT: - There is no evidence of deep vein thrombosis in the lower extremity. However, portions of this examination were limited- see technologist comments above.  - No cystic structure found in the popliteal fossa. - Ultrasound characteristics of enlarged lymph nodes noted in the groin.  *See table(s) above for measurements and observations.    Preliminary    CT ABDOMEN PELVIS WO CONTRAST  Result Date:  10/08/2021 CLINICAL DATA:  Abdominal pain, nausea, vomiting, history colon cancer EXAM: CT ABDOMEN AND PELVIS WITHOUT CONTRAST TECHNIQUE: Multidetector CT imaging of the abdomen and pelvis was performed following the standard protocol without IV contrast. RADIATION DOSE REDUCTION: This exam was performed according to the departmental dose-optimization program which includes automated exposure control, adjustment of the mA and/or kV according to patient size and/or use of iterative reconstruction technique. COMPARISON:  09/11/2021 FINDINGS: Lower chest: Bibasilar pleural effusions and atelectasis greater on RIGHT, increased from prior exam. Hepatobiliary: No focal hepatic abnormalities. Gallbladder unremarkable. Pancreas: Normal appearance Spleen: Enlarged, 17.5 x 5.5 x 13.7 cm (volume = 690 cm^3) Adrenals/Urinary Tract: Adrenal glands, kidneys, and ureters normal appearance. Bladder decompressed. Stomach/Bowel: Appendix not visualized. Stomach and bowel loops grossly unremarkable Vascular/Lymphatic: Atherosclerotic calcifications aorta. Aorta normal caliber. Extensive adenopathy including retrocrural, gastrosplenic ligament, periportal, retroperitoneal, inguinal, BILATERAL pelvic. Observe nodes have generally mildly increased in sizes versus previous exam. Reproductive: Unremarkable prostate gland and seminal vesicles Other: Scattered free fluid. Extensive subcutaneous edema. No free air. No hernia. Musculoskeletal: Small sclerotic lesion RIGHT sacrum unchanged. Degenerative disc disease changes thoracolumbar spine. IMPRESSION: Extensive adenopathy as above, with many of the nodes slightly increased in sizes versus previous exam with associated splenomegaly. Findings favor lymphoma, extensive metastatic disease not completely excluded. Bibasilar pleural effusions and atelectasis greater on RIGHT, increased from prior exam. Scattered ascites and diffuse subcutaneous edema. Aortic Atherosclerosis (ICD10-I70.0).  Electronically Signed   By: Lavonia Dana M.D.   On: 10/09/2021 14:08    Anti-infectives (From admission, onward)    None        Assessment/Plan Diffuse lymphadenopathy - Patient is being admitted to the medical service for his worsening acute kidney injury and electrolyte abnormalities. We are asked to see for excisional lymph node biopsy. We will follow along and plan to perform this admission once he is medically more stable.  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   Wellington Hampshire, [place] [facility] 10/03/2021, 3:38 PM Please see Amion for pager number during day hours 7:00am-4:30pm

## 2021-09-20 NOTE — H&P (Addendum)
[facility] History and Physical  Shell Yandow ALP:[id number] DOB: 09/10/1960 DOA: 10/09/2021   PCP: Unk Pinto, MD  Specialists: Dr. Alen Blew with oncology  Chief Complaint: Nausea and vomiting  HPI: Thomas Waller is a 61 y.o. male with past medical history of essential hypertension, obesity, gout, rheumatoid arthritis on Enbrel who was recently hospitalized on August 28 with complaints of fatigue nausea abdominal pain.  He was found to have diffuse lymphadenopathy.  He was found to be hypercalcemic.  He was admitted to the hospital.  He also had acute kidney injury.  He was stabilized.  Undergo biopsy of one of his inguinal lymph nodes was performed.  He was subsequently discharged home on 09/16/21.  He went to his oncologist today with complaints of nausea and vomiting ongoing for the last several days.  Also has been having abdominal pain.  Denies any blood in the emesis.  Has been constipated.  No syncopal episodes.  No fever or chills.  He was sent over to the emergency department.  The results from the lymph node biopsy are available and as per medical oncology they are not sufficient to initiate treatment.  Patient will need an excisional biopsy of one of his lymph nodes.  Currently patient is somnolent since he received narcotics.  Most of the history was provided by his wife.  He was able to wake up and did follow commands.  In the emergency department he was once again found to have acute kidney injury with worsening creatinine compared to last week.  Also noted to have low sodium levels.  Leukocytosis was noted.  He was also found to be hyperkalemic.  He was given IV fluids and treatment to decrease his potassium level.  He will be hospitalized for further management.  Home Medications: Prior to Admission medications   Medication Sig Start Date End Date Taking? Authorizing Provider  allopurinol (ZYLOPRIM) 300 MG tablet Take 300 mg by mouth daily.    Yes [provider]  aspirin EC 81 MG tablet Take 81 mg by mouth daily.   Yes [provider]  Cholecalciferol (VITAMIN D PO) Take 5,000 Units by mouth 2 (two) times daily.   Yes [provider]  ENBREL SURECLICK 50 MG/ML injection Inject 50 mg into the skin once a week. 10/13/13  Yes [provider]  fluocinonide ointment (LIDEX) 3.53 % Apply 1 Application topically 2 (two) times daily as needed (dermatitis). 10/06/15  Yes [provider]  Magnesium 500 MG TABS Take 1 tablet by mouth daily.   Yes [provider]  Multiple Vitamin (MULTIVITAMIN) tablet Take 1 tablet by mouth daily.   Yes [provider]  Omega-3 Fatty Acids (FISH OIL) 1200 MG CAPS Take by mouth 2 (two) times daily.   Yes [provider]  ondansetron (ZOFRAN) 4 MG tablet Take 1 tablet (4 mg total) by mouth every 6 (six) hours for 30 doses. 09/15/21 Oct 02, 2021 Yes Countryman, Cheri Rous, MD  oxyCODONE (ROXICODONE) 5 MG immediate release tablet Take 1-2 tablets (5-10 mg total) by mouth every 4 (four) hours as needed for up to 5 days for severe pain. 09/15/21 09/26/2021 Yes Countryman, Cheri Rous, MD  PREVIDENT 5000 PLUS 1.1 % CREA dental cream Place 1 Application onto teeth at bedtime. 07/22/21  Yes [provider]  testosterone cypionate (DEPOTESTOSTERONE CYPIONATE) 200 MG/ML injection INJECT 1 ML INTRAMUSCULARLY EVERY 7 DAYS. DISCARD THE REMAINDER. Patient taking differently: Inject 200 mg into the muscle once a week. Discard any remainder. 06/14/21  Yes  Alycia Rossetti, NP  vitamin B-12 (CYANOCOBALAMIN) 1000 MCG tablet Take 1,000 mcg by mouth daily.   Yes [provider]  vitamin C (ASCORBIC ACID) 500 MG tablet Take 500 mg by mouth 2 (two) times daily.   Yes [provider]  Zinc 50 MG TABS Take 1 tablet by mouth daily.   Yes [provider]  SYRINGE-NEEDLE, DISP, 3 ML (B-D 3CC LUER-LOK SYR 21GX1") 21G X 1" 3 ML MISC Use 1 syringe with injection 06/14/21   Alycia Rossetti,  NP    Allergies:  Allergies  Allergen Reactions   Zestril [Lisinopril] Cough    Past Medical History: Past Medical History:  Diagnosis Date   Arthritis    [place] Polyarthritis   Cholelithiases 02/04/2018   Per Abd [place] 01/2018 with fatty liver, no inflammation or obstruction   Elevated hemoglobin A1c measurement    Gout    Hyperlipidemia    Hypertension    Rheumatoid arthritis [facility])     Past Surgical History:  Procedure Laterality Date   CATARACT EXTRACTION Right 04/2020   Dr. Tommy Rainwater   CATARACT EXTRACTION Left 05/2020   Dr. Tommy Rainwater   WISDOM TOOTH EXTRACTION      Social History: Lives with his wife.  No history of smoking.  Occasional alcohol use.  No illicit drug use.  Family History:  Family History  Problem Relation Age of Onset   Heart attack Half-Brother   According to his wife there is extensive history of cancer on patient's mother's side.  Review of Systems -unable to do due to excessive somnolence  Physical Examination  Vitals:   09/27/2021 1336 10/09/2021 1411 09/26/2021 1430 09/19/2021 1500  BP:  98/61 114/81 124/64  Pulse: 100 98 96 92  Resp:  18 (!) 24 18  Temp:      TempSrc:      SpO2:  96% 95% 95%    BP 124/64   Pulse 92   Temp (!) 97.5 F (36.4 C) (Oral)   Resp 18   SpO2 95%   General appearance: Somnolent but arousable.  Follows commands.  In no distress Head: Normocephalic, without obvious abnormality, atraumatic Eyes: conjunctivae/corneas clear. PERRL, EOM's intact.  Throat: Dry mucous membranes, no oral lesions Neck: no carotid bruit, no JVD, supple, symmetrical, trachea midline, and thyroid not enlarged, symmetric, no tenderness/mass/nodules Resp: Normal effort at rest.  Diminished air entry at the bases.  Occasional scattered wheezing bilaterally.  No definite crackles. Cardio: regular rate and rhythm, S1, S2 normal, no murmur, click, rub or gallop GI: soft, non-tender; bowel sounds normal; no masses,  no organomegaly Extremities: edema  edema noted bilateral lower extremities Pulses: 2+ and symmetric Skin: Skin color, texture, turgor normal. No rashes or lesions Neurologic: Somnolent but easily arousable.   Cranial nerves II to XII intact.  Able to lift both his legs and his arms.  No obvious deficits noted.    Labs on Admission: I have personally reviewed following labs and imaging studies  CBC: Recent Labs  Lab 09/14/21 0417 09/15/21 0432 09/15/21 2013 09/24/2021 1147  WBC 14.6* 16.2* 15.9* 22.8*  HGB 15.5 15.9 16.3 16.2  HCT 46.4 47.7 46.4 45.0  MCV 91.7 90.0 86.4 83.3  PLT 158 164 148* 67*   Basic Metabolic Panel: Recent Labs  Lab 09/14/21 0417 09/15/21 0432 09/15/21 2013 09/24/2021 1147  NA 134* 131* 132* 126*  K 4.8 4.9 5.2* 6.1*  CL 105 104 99 97*  CO2 '23 22 22 '$ 18*  GLUCOSE 93  90 95 85  BUN 52* 51* 51* 105*  CREATININE 2.06* 1.91* 2.07* 3.86*  CALCIUM 12.0* 11.5* 12.0* 11.2*   GFR: Estimated Creatinine Clearance: 25.6 mL/min (A) (by C-G formula based on SCr of 3.86 mg/dL (H)). Liver Function Tests: Recent Labs  Lab 09/14/21 0417 09/15/21 0432 09/15/21 2013 10/15/2021 1147  AST 50* 58* 70* 62*  ALT 57* 65* 74* 68*  ALKPHOS 191* 234* 276* 292*  BILITOT 0.9 1.1 1.2 2.9*  PROT 5.2* 5.5* 5.4* 5.2*  ALBUMIN 2.6* 2.7* 2.8* 2.6*   Recent Labs  Lab 09/15/21 2013 09/21/2021 1147  LIPASE 28 30     Radiological Exams on Admission: CT ABDOMEN PELVIS WO CONTRAST  Result Date: 10/14/2021 CLINICAL DATA:  Abdominal pain, nausea, vomiting, history colon cancer EXAM: CT ABDOMEN AND PELVIS WITHOUT CONTRAST TECHNIQUE: Multidetector CT imaging of the abdomen and pelvis was performed following the standard protocol without IV contrast. RADIATION DOSE REDUCTION: This exam was performed according to the departmental dose-optimization program which includes automated exposure control, adjustment of the mA and/or kV according to patient size and/or use of iterative reconstruction technique. COMPARISON:   09/11/2021 FINDINGS: Lower chest: Bibasilar pleural effusions and atelectasis greater on RIGHT, increased from prior exam. Hepatobiliary: No focal hepatic abnormalities. Gallbladder unremarkable. Pancreas: Normal appearance Spleen: Enlarged, 17.5 x 5.5 x 13.7 cm (volume = 690 cm^3) Adrenals/Urinary Tract: Adrenal glands, kidneys, and ureters normal appearance. Bladder decompressed. Stomach/Bowel: Appendix not visualized. Stomach and bowel loops grossly unremarkable Vascular/Lymphatic: Atherosclerotic calcifications aorta. Aorta normal caliber. Extensive adenopathy including retrocrural, gastrosplenic ligament, periportal, retroperitoneal, inguinal, BILATERAL pelvic. Observe nodes have generally mildly increased in sizes versus previous exam. Reproductive: Unremarkable prostate gland and seminal vesicles Other: Scattered free fluid. Extensive subcutaneous edema. No free air. No hernia. Musculoskeletal: Small sclerotic lesion RIGHT sacrum unchanged. Degenerative disc disease changes thoracolumbar spine. IMPRESSION: Extensive adenopathy as above, with many of the nodes slightly increased in sizes versus previous exam with associated splenomegaly. Findings favor lymphoma, extensive metastatic disease not completely excluded. Bibasilar pleural effusions and atelectasis greater on RIGHT, increased from prior exam. Scattered ascites and diffuse subcutaneous edema. Aortic Atherosclerosis (ICD10-I70.0). Electronically Signed   By: Lavonia Dana M.D.   On: 09/22/2021 14:08    My interpretation of Electrocardiogram: Sinus tachycardia 104 bpm.  Intervals appear to be normal.  Nonspecific ST changes similar to before.  No acute changes noted.   Problem List  Principal Problem:   AKI (acute kidney injury) ([place]) Active Problems:   Morbid obesity ([place])- BMI 35+ with htn, hyperlipidemia   Rheumatoid arthritis involving multiple sites ([place])   Hypercalcemia of malignancy   Diffuse lymphadenopathy   Hyperkalemia    Hyponatremia   Abnormal LFTs   Thrombocytopenia ([facility])   Nausea and vomiting   Assessment: This is a 61 year old Caucasian male with past medical history as stated earlier who comes in with a few day history of nausea and vomiting and is found to have acute kidney injury.  He will be hospitalized for further management  Plan: #1. Acute kidney injury on chronic kidney disease stage 2: Considering his history of nausea and vomiting and poor oral intake, this is likely prerenal.  His BUN is noted to be elevated.  He does have some lower extremity edema but that could be due to hypoalbuminemia.  He has been given IV fluids which will be continued.  Monitor urine output.  Bladder scans.  May need in and out catheterization for UA if he does not void on his own. No  hydronephrosis on CT.  ADDENDUM Not much UO after approx 2.5l of IVF. Bladder scan shows 400cc. May need foley. Discussed with RN. Urine studies and clinical picture discussed with Dr. Joylene Grapes with Nephrology. He recommends stopping IVF, albumin, checking uric acid and repeat BMET tonight. May need formal consult in AM if he doesn't improve.  #2. Hyperkalemia: Secondary to renal dysfunction.  He has been given insulin glucose, bicarbonate and Lokelma.  We will recheck labs later today.  #3. Hyponatremia: Likely due to hypovolemia.  We will give normal saline infusion and recheck labs in the morning.  We will do urine osmolality, urine sodium and creatinine.  #4. Nausea and vomiting: Etiology unclear.  No evidence for bowel obstruction.  Uremia could be causing his nausea.  #4. Hypercalcemia of malignancy: Calcium level is 11.2, actually better than the 12.0 at the time of discharge last week.  He did receive Zometa during his previous hospitalization.  #5. Diffuse lymphadenopathy/Lymphoma: He did undergo a needle biopsy of his inguinal lymph node last week.  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  However pathology report is not sufficient for  oncology to start treatment.  They recommend excisional biopsy.  General surgery consulted for same.  #6. Thrombocytopenia/leukocytosis: Reason for thrombocytopenia is not entirely clear his platelet counts were close to normal during previous hospitalization.  We will check LDH.  Could be related to his lymphoma.  Oncology input will be appreciated.  Leukocytosis could be reactive.  No evidence for overt infection.  He is noted to be afebrile.  UA is pending.  #7. Abnormal LFTs: Reason is unclear.  He was found to have elevated transaminases during previous admission as well.  CT scan does not show any liver lesions.  #8. Lower extremity edema: Possibly due to hypoalbuminemia.  We will order lower extremity Doppler studies.  Echocardiogram done during previous hospitalization did show normal left ventricular systolic function.  #9. History of rheumatoid arthritis: Takes Enbrel.  Currently on hold.  #10. History of gout: Continue allopurinol when he is able to take orally.  #11. Essential hypertension: Losartan was held during previous admission and even at discharge due to kidney injury.  Monitor blood pressures closely.  DVT Prophylaxis: No heparin products.  Continue.  Once DVT has been ruled out by Doppler study SCDs can be used. Code Status: Full code Family Communication: Discussed with his wife Disposition: Hopefully return home when improved Consults called: General surgery Admission Status: Status is: Inpatient Remains inpatient appropriate because: Acute kidney injury    Severity of Illness: The appropriate patient status for this patient is INPATIENT. Inpatient status is judged to be reasonable and necessary in order to provide the required intensity of service to ensure the patient's safety. The patient's presenting symptoms, physical exam findings, and initial radiographic and laboratory data in the context of their chronic comorbidities is felt to place them at high risk for  further clinical deterioration. Furthermore, it is not anticipated that the patient will be medically stable for discharge from the hospital within 2 midnights of admission.   * I certify that at the point of admission it is my clinical judgment that the patient will require inpatient hospital care spanning beyond 2 midnights from the point of admission due to high intensity of service, high risk for further deterioration and high frequency of surveillance required.*   Further management decisions will depend on results of further testing and patient's response to treatment.   Edie Vallandingham [employer]  [facility] operational officer on [email or web address]  10/11/2021,  3:17 PM

## 2021-09-20 NOTE — Progress Notes (Signed)
Bilateral lower extremity venous duplex has been completed. Preliminary results can be found in CV Proc through chart review.   10/04/2021 3:35 PM Carlos Levering RVT

## 2021-09-20 NOTE — Progress Notes (Signed)
PC to ED charge nurse, informed her Dr Alen Blew wants patient evaluated in ED for extreme abdominal & back pain, nausea & vomiting, likely hypercalcemia, patient being evaluated for lymphoma.  Advised to bring patient to room 16 in ED at 1130.

## 2021-09-20 NOTE — ED Notes (Signed)
Discard not

## 2021-09-20 NOTE — ED Provider Notes (Signed)
[facility] DEPT Provider Note   CSN: [id number] Arrival date & time: 10/14/2021  1137     History  Chief Complaint  Patient presents with   Abdominal Pain   Emesis    Thomas Waller is a 61 y.o. male.  Patient presents from the oncology clinic for failure to thrive.  He has been vomiting with abdominal pain for a few days.  He just got discharged from the hospital on August 31 after having a biopsy.  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  Dr. Alen Blew sent him to the emergency department for continued evaluation and admission to the hospital  The history is provided by the patient and medical records. No language interpreter was used.  Abdominal Pain Pain location:  Generalized Pain quality: aching   Pain radiates to:  Does not radiate Pain severity:  Moderate Onset quality:  Gradual Timing:  Constant Progression:  Waxing and waning Chronicity:  Recurrent Context: not alcohol use   Relieved by:  Nothing Worsened by:  Nothing Associated symptoms: vomiting   Associated symptoms: no chest pain, no cough, no diarrhea, no fatigue and no hematuria   Emesis Associated symptoms: abdominal pain   Associated symptoms: no cough, no diarrhea and no headaches        Home Medications Prior to Admission medications   Medication Sig Start Date End Date Taking? Authorizing Provider  allopurinol (ZYLOPRIM) 300 MG tablet Take 300 mg by mouth daily.     [provider]  aspirin EC 81 MG tablet Take 81 mg by mouth daily.    [provider]  Cholecalciferol (VITAMIN D PO) Take 5,000 Units by mouth 2 (two) times daily.    [provider]  ENBREL SURECLICK 50 MG/ML injection Inject 50 mg into the skin once a week. 10/13/13   [provider]  fluocinonide ointment (LIDEX) 5.27 % Apply 1 Application topically 2 (two) times daily. 10/06/15   [provider]  Magnesium 500 MG TABS Take 1  tablet by mouth daily.    [provider]  Multiple Vitamin (MULTIVITAMIN) tablet Take 1 tablet by mouth daily.    [provider]  Omega-3 Fatty Acids (FISH OIL) 1200 MG CAPS Take by mouth 2 (two) times daily.    [provider]  ondansetron (ZOFRAN) 4 MG tablet Take 1 tablet (4 mg total) by mouth every 6 (six) hours for 30 doses. 09/15/21 10/11/2021  Tretha Sciara, MD  oxyCODONE (ROXICODONE) 5 MG immediate release tablet Take 1-2 tablets (5-10 mg total) by mouth every 4 (four) hours as needed for up to 5 days for severe pain. 09/15/21 09/30/2021  Tretha Sciara, MD  PREVIDENT 5000 PLUS 1.1 % CREA dental cream Place 1 Application onto teeth at bedtime. 07/22/21   [provider]  SYRINGE-NEEDLE, DISP, 3 ML (B-D 3CC LUER-LOK SYR 21GX1") 21G X 1" 3 ML MISC Use 1 syringe with injection 06/14/21   Alycia Rossetti, NP  testosterone cypionate (DEPOTESTOSTERONE CYPIONATE) 200 MG/ML injection INJECT 1 ML INTRAMUSCULARLY EVERY 7 DAYS. DISCARD THE REMAINDER. Patient taking differently: Inject 200 mg into the muscle once a week. Discard any remainder. 06/14/21   Alycia Rossetti, NP  vitamin B-12 (CYANOCOBALAMIN) 1000 MCG tablet Take 1,000 mcg by mouth daily.    [provider]  vitamin C (ASCORBIC ACID) 500 MG tablet Take 500 mg by mouth 2 (two) times daily.    [provider]  Zinc 50 MG TABS  Take 1 tablet by mouth daily.    [provider]      Allergies    Zestril [lisinopril]    Review of Systems   Review of Systems  Constitutional:  Negative for appetite change and fatigue.  HENT:  Negative for congestion, ear discharge and sinus pressure.   Eyes:  Negative for discharge.  Respiratory:  Negative for cough.   Cardiovascular:  Negative for chest pain.  Gastrointestinal:  Positive for abdominal pain and vomiting. Negative for diarrhea.  Genitourinary:  Negative for frequency and hematuria.  Musculoskeletal:  Negative for back pain.  Skin:   Negative for rash.  Neurological:  Negative for seizures and headaches.  Psychiatric/Behavioral:  Negative for hallucinations.     Physical Exam Updated Vital Signs BP 98/61 (BP Location: Right Arm)   Pulse 98   Temp (!) 97.5 F (36.4 C) (Oral)   Resp 18   SpO2 96%  Physical Exam Vitals reviewed.  Constitutional:      General: He is in acute distress.     Appearance: He is well-developed.  HENT:     Head: Normocephalic.     Mouth/Throat:     Mouth: Mucous membranes are dry.  Eyes:     General: No scleral icterus.    Conjunctiva/sclera: Conjunctivae normal.  Neck:     Thyroid: No thyromegaly.  Cardiovascular:     Rate and Rhythm: Normal rate and regular rhythm.     Heart sounds: No murmur heard.    No friction rub. No gallop.  Pulmonary:     Breath sounds: No stridor. No wheezing or rales.  Chest:     Chest wall: No tenderness.  Abdominal:     General: There is no distension.     Tenderness: There is abdominal tenderness. There is no rebound.  Musculoskeletal:        General: Normal range of motion.     Cervical back: Neck supple.  Lymphadenopathy:     Cervical: No cervical adenopathy.  Skin:    Findings: No erythema or rash.  Neurological:     Mental Status: He is oriented to person, place, and time.     Motor: No abnormal muscle tone.     Coordination: Coordination normal.  Psychiatric:        Behavior: Behavior normal.     ED Results / Procedures / Treatments   Labs (all labs ordered are listed, but only abnormal results are displayed) Labs Reviewed  COMPREHENSIVE METABOLIC PANEL - Abnormal; Notable for the following components:      Result Value   Sodium 126 (*)    Potassium 6.1 (*)    Chloride 97 (*)    CO2 18 (*)    BUN 105 (*)    Creatinine, Ser 3.86 (*)    Calcium 11.2 (*)    Total Protein 5.2 (*)    Albumin 2.6 (*)    AST 62 (*)    ALT 68 (*)    Alkaline Phosphatase 292 (*)    Total Bilirubin 2.9 (*)    GFR, Estimated 17 (*)    All  other components within normal limits  CBC - Abnormal; Notable for the following components:   WBC 22.8 (*)    Platelets 67 (*)    All other components within normal limits  LIPASE, BLOOD  URINALYSIS, ROUTINE W REFLEX MICROSCOPIC    EKG EKG Interpretation  Date/Time:  Tuesday September 20 2021 12:10:08 EDT Ventricular Rate:  104 PR Interval:  171  QRS Duration: 89 QT Interval:  318 QTC Calculation: 419 R Axis:   6 Text Interpretation: Sinus tachycardia Extensive anterior infarct, old Confirmed by [place] Ferguson [phone]) on 10/04/2021 1:00:24 PM  Radiology CT ABDOMEN PELVIS WO CONTRAST  Result Date: 10/01/2021 CLINICAL DATA:  Abdominal pain, nausea, vomiting, history colon cancer EXAM: CT ABDOMEN AND PELVIS WITHOUT CONTRAST TECHNIQUE: Multidetector CT imaging of the abdomen and pelvis was performed following the standard protocol without IV contrast. RADIATION DOSE REDUCTION: This exam was performed according to the departmental dose-optimization program which includes automated exposure control, adjustment of the mA and/or kV according to patient size and/or use of iterative reconstruction technique. COMPARISON:  09/11/2021 FINDINGS: Lower chest: Bibasilar pleural effusions and atelectasis greater on RIGHT, increased from prior exam. Hepatobiliary: No focal hepatic abnormalities. Gallbladder unremarkable. Pancreas: Normal appearance Spleen: Enlarged, 17.5 x 5.5 x 13.7 cm (volume = 690 cm^3) Adrenals/Urinary Tract: Adrenal glands, kidneys, and ureters normal appearance. Bladder decompressed. Stomach/Bowel: Appendix not visualized. Stomach and bowel loops grossly unremarkable Vascular/Lymphatic: Atherosclerotic calcifications aorta. Aorta normal caliber. Extensive adenopathy including retrocrural, gastrosplenic ligament, periportal, retroperitoneal, inguinal, BILATERAL pelvic. Observe nodes have generally mildly increased in sizes versus previous exam. Reproductive: Unremarkable prostate gland and  seminal vesicles Other: Scattered free fluid. Extensive subcutaneous edema. No free air. No hernia. Musculoskeletal: Small sclerotic lesion RIGHT sacrum unchanged. Degenerative disc disease changes thoracolumbar spine. IMPRESSION: Extensive adenopathy as above, with many of the nodes slightly increased in sizes versus previous exam with associated splenomegaly. Findings favor lymphoma, extensive metastatic disease not completely excluded. Bibasilar pleural effusions and atelectasis greater on RIGHT, increased from prior exam. Scattered ascites and diffuse subcutaneous edema. Aortic Atherosclerosis (ICD10-I70.0). Electronically Signed   By: Lavonia Dana M.D.   On: 10/12/2021 14:08    Procedures Procedures    Medications Ordered in ED Medications  sodium chloride 0.9 % bolus 1,000 mL (has no administration in time range)  sodium zirconium cyclosilicate (LOKELMA) packet 10 g (has no administration in time range)  HYDROmorphone (DILAUDID) injection 1 mg (1 mg Intravenous Given 10/15/2021 1317)  ondansetron (ZOFRAN) injection 4 mg (4 mg Intravenous Given 10/13/2021 1320)  sodium chloride 0.9 % bolus 1,000 mL (1,000 mLs Intravenous New Bag/Given 09/28/2021 1324)  insulin aspart (novoLOG) injection 5 Units (5 Units Intravenous Given 09/19/2021 1324)    And  dextrose 50 % solution 50 mL (50 mLs Intravenous Given 10/06/2021 1322)  sodium bicarbonate injection 50 mEq (50 mEq Intravenous Given 10/09/2021 1321)    ED Course/ Medical Decision Making/ A&P  CRITICAL CARE Performed by: [place] Ferguson Total critical care time: 40 minutes Critical care time was exclusive of separately billable procedures and treating other patients. Critical care was necessary to treat or prevent imminent or life-threatening deterioration. Critical care was time spent personally by me on the following activities: development of treatment plan with patient and/or surrogate as well as nursing, discussions with consultants, evaluation of patient's  response to treatment, examination of patient, obtaining history from patient or surrogate, ordering and performing treatments and interventions, ordering and review of laboratory studies, ordering and review of radiographic studies, pulse oximetry and re-evaluation of patient's condition.                          Medical Decision Making Amount and/or Complexity of Data Reviewed Labs: ordered. Radiology: ordered.  Risk OTC drugs. Prescription drug management. Decision regarding hospitalization.  This patient presents to the ED for concern of abdominal pain and poor p.o. intake,  this involves an extensive number of treatment options, and is a complaint that carries with it a high risk of complications and morbidity.  The differential diagnosis includes bowel obstruction, worsening cancer   Co morbidities that complicate the patient evaluation  Most likely lymphoma   Additional history obtained:  Additional history obtained from patient External records from outside source obtained and reviewed including hospital records   Lab Tests:  I Ordered, and personally interpreted labs.  The pertinent results include: White blood count 22,000   Imaging Studies ordered:  I ordered imaging studies including CT abdomen I independently visualized and interpreted imaging which showed lymphadenopathy with most likely metastatic disease sinus tach I agree with the radiologist interpretation   Cardiac Monitoring: / EKG:  The patient was maintained on a cardiac monitor.  I personally viewed and interpreted the cardiac monitored which showed an underlying rhythm of: Sinus tach   Consultations Obtained:  I requested consultation with the hospitalist and oncologist,  and discussed lab and imaging findings as well as pertinent plan - they recommend: Admit   Problem List / ED Course / Critical interventions / Medication management  Abdominal pain and poor p.o. intake I ordered medication  including saline for dehydration and Dilaudid for pain Reevaluation of the patient after these medicines showed that the patient improved I have reviewed the patients home medicines and have made adjustments as needed   Social Determinants of Health:  None   Test / Admission - Considered:  None  Patient with probable lymphoma.  He is severely dehydrated with AKI and hyperkalemia.  He will be admitted to medicine with oncology following        Final Clinical Impression(s) / ED Diagnoses Final diagnoses:  Dehydration    Rx / DC Orders ED Discharge Orders     None         M[place] Ferguson, MD October 15, 2021 1059

## 2021-09-20 NOTE — Progress Notes (Signed)
Hematology and Oncology Follow Up Visit  Thomas Waller [id number] December 20, 1960 60 y.o. 10/04/2021 10:52 AM Unk Pinto, MDAustria, Donnamarie Poag, DO   Principle Diagnosis: 61 year old with lymphadenopathy and hypercalcemia with work-up currently ongoing for diagnosis of lymphoma noted in August 2023.   Prior Therapy: Status post percutaneous biopsy of an inguinal lymph node completed on September 13, 2021.  The biopsy showed atypical lymphocytes alteration indicating a T-cell neoplasm.  Current therapy: Under evaluation for diagnosis and treatment.  Interim History: [name] returns today for a follow-up.  Since his discharge, he has been doing poorly.  He has reported abdominal pain as well as nausea vomiting and failure to thrive.  He is very weak today and unable to provide much history.     Medications: I have reviewed the patient's current medications.  Current Outpatient Medications  Medication Sig Dispense Refill   allopurinol (ZYLOPRIM) 300 MG tablet Take 300 mg by mouth daily.      aspirin EC 81 MG tablet Take 81 mg by mouth daily.     Cholecalciferol (VITAMIN D PO) Take 5,000 Units by mouth 2 (two) times daily.     ENBREL SURECLICK 50 MG/ML injection Inject 50 mg into the skin once a week.     fluocinonide ointment (LIDEX) 7.42 % Apply 1 Application topically 2 (two) times daily.     Magnesium 500 MG TABS Take 1 tablet by mouth daily.     Multiple Vitamin (MULTIVITAMIN) tablet Take 1 tablet by mouth daily.     Omega-3 Fatty Acids (FISH OIL) 1200 MG CAPS Take by mouth 2 (two) times daily.     ondansetron (ZOFRAN) 4 MG tablet Take 1 tablet (4 mg total) by mouth every 6 (six) hours for 30 doses. 30 tablet 0   oxyCODONE (ROXICODONE) 5 MG immediate release tablet Take 1-2 tablets (5-10 mg total) by mouth every 4 (four) hours as needed for up to 5 days for severe pain. 40 tablet 0   PREVIDENT 5000 PLUS 1.1 % CREA dental cream Place 1 Application onto teeth at bedtime.      SYRINGE-NEEDLE, DISP, 3 ML (B-D 3CC LUER-LOK SYR 21GX1") 21G X 1" 3 ML MISC Use 1 syringe with injection 50 each 1   testosterone cypionate (DEPOTESTOSTERONE CYPIONATE) 200 MG/ML injection INJECT 1 ML INTRAMUSCULARLY EVERY 7 DAYS. DISCARD THE REMAINDER. (Patient taking differently: Inject 200 mg into the muscle once a week. Discard any remainder.) 12 mL 1   vitamin B-12 (CYANOCOBALAMIN) 1000 MCG tablet Take 1,000 mcg by mouth daily.     vitamin C (ASCORBIC ACID) 500 MG tablet Take 500 mg by mouth 2 (two) times daily.     Zinc 50 MG TABS Take 1 tablet by mouth daily.     No current facility-administered medications for this visit.     Allergies:  Allergies  Allergen Reactions   Zestril [Lisinopril] Cough     Physical Exam: Blood pressure (!) 161/89, pulse (!) 116, temperature 97.8 F (36.6 C), temperature source Temporal, resp. rate 19, height '5\' 10"'  (1.778 m), SpO2 95 %.  ECOG: 2    General appearance: Uncomfortable appearing gentleman with mild distress.   Head: Normocephalic without any trauma Oropharynx: Mucous membranes are moist and pink without any thrush or ulcers. Eyes: Pupils are equal and round reactive to light. Lymph nodes: No cervical, supraclavicular, inguinal or axillary lymphadenopathy.   Heart:regular rate and rhythm.  S1 and S2 without leg edema. Lung: Clear without any rhonchi or wheezes.  No dullness to  percussion. Abdomin: Distended and tender to touch.  Hyperactive bowel sounds. Musculoskeletal: No joint deformity or effusion.  Full range of motion noted. Neurological: No deficits noted on motor, sensory and deep tendon reflex exam. Skin: No petechial rash or dryness.  His skin appears dry.    Lab Results: Lab Results  Component Value Date   WBC 15.9 (H) 09/15/2021   HGB 16.3 09/15/2021   HCT 46.4 09/15/2021   MCV 86.4 09/15/2021   PLT 148 (L) 09/15/2021   PSA 0.32 04/04/2021     Chemistry      Component Value Date/Time   NA 132 (L) 09/15/2021  2013   K 5.2 (H) 09/15/2021 2013   CL 99 09/15/2021 2013   CO2 22 09/15/2021 2013   BUN 51 (H) 09/15/2021 2013   CREATININE 2.07 (H) 09/15/2021 2013   CREATININE 1.50 (H) 09/08/2021 1221      Component Value Date/Time   CALCIUM 12.0 (H) 09/15/2021 2013   ALKPHOS 276 (H) 09/15/2021 2013   AST 70 (H) 09/15/2021 2013   ALT 74 (H) 09/15/2021 2013   BILITOT 1.2 09/15/2021 2013         Impression and Plan:   61 year old with:  1.  Lymphadenopathy and noted on CT scan obtained on September 11, 2021.  He was found to have lymphadenopathy in neck, chest abdomen and pelvis which is highly suggestive of a lymphoproliferative disorder.  Percutaneous biopsy was not completely diagnostic and suggestive of T-cell neoplasm  The differential diagnosis was reviewed today and management choices were discussed.  The neck step is to obtain a PET scan to complete his staging and pending these results he will require likely an excisional biopsy and a possible bone marrow biopsy.  Treatment options will be depending on the nature of his malignancy.  Systemic chemotherapy remains the main option to treat.  I recommended repeat excisional biopsy to be done as soon as possible.  I feel the patient needs to be hospitalized again for management of his symptoms and we can obtain tissue biopsy at that time.  I recommend consultation with general surgery to obtain the next biopsy.  2.  Abdominal pain and hypercalcemia: Related to malignancy.  It is likely what is contributing to his nausea and vomiting and failure to thrive.  Will be referred to the emergency department for IV hydration, hospitalization and to complete his work-up and manage his symptoms.  He might need to repeat imaging studies of the abdomen to rule out abdominal obstruction.  3.  Increase transaminases and alkaline phosphatase: Likely related to his malignancy.  4.  Follow-up and disposition: He will be referred to the emergency department  given his acuity of his symptoms  30  minutes were dedicated to this visit. The time was spent on reviewing pathology results, laboratory data, imaging studies, discussing treatment options, discussing differential diagnosis and answering questions regarding future plan.    Zola Button, MD 9/5/202310:52 AM

## 2021-09-20 NOTE — Telephone Encounter (Signed)
Left message on voicemail to call back to schedule a Hospital Follow Up

## 2021-09-21 ENCOUNTER — Inpatient Hospital Stay (HOSPITAL_COMMUNITY): Payer: BC Managed Care – PPO

## 2021-09-21 ENCOUNTER — Inpatient Hospital Stay (HOSPITAL_COMMUNITY): Payer: BC Managed Care – PPO | Admitting: Certified Registered Nurse Anesthetist

## 2021-09-21 DIAGNOSIS — E860 Dehydration: Secondary | ICD-10-CM | POA: Diagnosis not present

## 2021-09-21 DIAGNOSIS — N179 Acute kidney failure, unspecified: Secondary | ICD-10-CM | POA: Diagnosis not present

## 2021-09-21 DIAGNOSIS — E875 Hyperkalemia: Secondary | ICD-10-CM | POA: Diagnosis not present

## 2021-09-21 LAB — BASIC METABOLIC PANEL
Anion gap: 12 (ref 5–15)
Anion gap: 15 (ref 5–15)
BUN: 104 mg/dL — ABNORMAL HIGH (ref 6–20)
BUN: 126 mg/dL — ABNORMAL HIGH (ref 6–20)
CO2: 18 mmol/L — ABNORMAL LOW (ref 22–32)
CO2: 16 mmol/L — ABNORMAL LOW (ref 22–32)
Calcium: 10.9 mg/dL — ABNORMAL HIGH (ref 8.9–10.3)
Calcium: 10.6 mg/dL — ABNORMAL HIGH (ref 8.9–10.3)
Chloride: 100 mmol/L (ref 98–111)
Chloride: 96 mmol/L — ABNORMAL LOW (ref 98–111)
Creatinine, Ser: 4.27 mg/dL — ABNORMAL HIGH (ref 0.61–1.24)
Creatinine, Ser: 5.57 mg/dL — ABNORMAL HIGH (ref 0.61–1.24)
GFR, Estimated: 15 mL/min — ABNORMAL LOW (ref >60)
GFR, Estimated: 11 mL/min — ABNORMAL LOW (ref >60)
Glucose, Bld: 74 mg/dL (ref 70–99)
Glucose, Bld: 80 mg/dL (ref 70–99)
Potassium: 6.3 mmol/L (ref 3.5–5.1)
Potassium: 5.7 mmol/L — ABNORMAL HIGH (ref 3.5–5.1)
Sodium: 130 mmol/L — ABNORMAL LOW (ref 135–145)
Sodium: 127 mmol/L — ABNORMAL LOW (ref 135–145)

## 2021-09-21 LAB — COMPREHENSIVE METABOLIC PANEL
ALT: 65 U/L — ABNORMAL HIGH (ref 0–44)
ALT: 134 U/L — ABNORMAL HIGH (ref 0–44)
AST: 52 U/L — ABNORMAL HIGH (ref 15–41)
AST: 166 U/L — ABNORMAL HIGH (ref 15–41)
Albumin: 2.7 g/dL — ABNORMAL LOW (ref 3.5–5.0)
Albumin: 2.2 g/dL — ABNORMAL LOW (ref 3.5–5.0)
Alkaline Phosphatase: 289 U/L — ABNORMAL HIGH (ref 38–126)
Alkaline Phosphatase: 249 U/L — ABNORMAL HIGH (ref 38–126)
Anion gap: 12 (ref 5–15)
Anion gap: 20 — ABNORMAL HIGH (ref 5–15)
BUN: 119 mg/dL — ABNORMAL HIGH (ref 6–20)
BUN: 134 mg/dL — ABNORMAL HIGH (ref 6–20)
CO2: 19 mmol/L — ABNORMAL LOW (ref 22–32)
CO2: 15 mmol/L — ABNORMAL LOW (ref 22–32)
Calcium: 10.8 mg/dL — ABNORMAL HIGH (ref 8.9–10.3)
Calcium: 11.2 mg/dL — ABNORMAL HIGH (ref 8.9–10.3)
Chloride: 97 mmol/L — ABNORMAL LOW (ref 98–111)
Chloride: 96 mmol/L — ABNORMAL LOW (ref 98–111)
Creatinine, Ser: 4.76 mg/dL — ABNORMAL HIGH (ref 0.61–1.24)
Creatinine, Ser: 6.14 mg/dL — ABNORMAL HIGH (ref 0.61–1.24)
GFR, Estimated: 13 mL/min — ABNORMAL LOW (ref >60)
GFR, Estimated: 10 mL/min — ABNORMAL LOW (ref >60)
Glucose, Bld: 79 mg/dL (ref 70–99)
Glucose, Bld: 139 mg/dL — ABNORMAL HIGH (ref 70–99)
Potassium: 5.5 mmol/L — ABNORMAL HIGH (ref 3.5–5.1)
Potassium: 5.9 mmol/L — ABNORMAL HIGH (ref 3.5–5.1)
Sodium: 128 mmol/L — ABNORMAL LOW (ref 135–145)
Sodium: 131 mmol/L — ABNORMAL LOW (ref 135–145)
Total Bilirubin: 2.6 mg/dL — ABNORMAL HIGH (ref 0.3–1.2)
Total Bilirubin: 2.3 mg/dL — ABNORMAL HIGH (ref 0.3–1.2)
Total Protein: 5.3 g/dL — ABNORMAL LOW (ref 6.5–8.1)
Total Protein: 4 g/dL — ABNORMAL LOW (ref 6.5–8.1)

## 2021-09-21 LAB — CBC
HCT: 42.7 % (ref 39.0–52.0)
HCT: 40.1 % (ref 39.0–52.0)
Hemoglobin: 15.2 g/dL (ref 13.0–17.0)
Hemoglobin: 14.1 g/dL (ref 13.0–17.0)
MCH: 29.7 pg (ref 26.0–34.0)
MCH: 30.5 pg (ref 26.0–34.0)
MCHC: 35.6 g/dL (ref 30.0–36.0)
MCHC: 35.2 g/dL (ref 30.0–36.0)
MCV: 83.4 fL (ref 80.0–100.0)
MCV: 86.8 fL (ref 80.0–100.0)
Platelets: 53 10*3/uL — ABNORMAL LOW (ref 150–400)
Platelets: 35 10*3/uL — ABNORMAL LOW (ref 150–400)
RBC: 5.12 MIL/uL (ref 4.22–5.81)
RBC: 4.62 MIL/uL (ref 4.22–5.81)
RDW: 15.2 % (ref 11.5–15.5)
RDW: 15.7 % — ABNORMAL HIGH (ref 11.5–15.5)
WBC: 18.9 10*3/uL — ABNORMAL HIGH (ref 4.0–10.5)
WBC: 22.4 10*3/uL — ABNORMAL HIGH (ref 4.0–10.5)
nRBC: 0.2 % (ref 0.0–0.2)
nRBC: 1.5 % — ABNORMAL HIGH (ref 0.0–0.2)

## 2021-09-21 LAB — POCT I-STAT 7, (LYTES, BLD GAS, ICA,H+H)
Acid-base deficit: 14 mmol/L — ABNORMAL HIGH (ref 0.0–2.0)
Bicarbonate: 16.5 mmol/L — ABNORMAL LOW (ref 20.0–28.0)
Calcium, Ion: 1.43 mmol/L — ABNORMAL HIGH (ref 1.15–1.40)
HCT: 42 % (ref 39.0–52.0)
Hemoglobin: 14.3 g/dL (ref 13.0–17.0)
O2 Saturation: 99 %
Potassium: 5.6 mmol/L — ABNORMAL HIGH (ref 3.5–5.1)
Sodium: 125 mmol/L — ABNORMAL LOW (ref 135–145)
TCO2: 18 mmol/L — ABNORMAL LOW (ref 22–32)
pCO2 arterial: 54.1 mmHg — ABNORMAL HIGH (ref 32–48)
pH, Arterial: 7.091 — CL (ref 7.35–7.45)
pO2, Arterial: 221 mmHg — ABNORMAL HIGH (ref 83–108)

## 2021-09-21 LAB — GLUCOSE, CAPILLARY
Glucose-Capillary: 121 mg/dL — ABNORMAL HIGH (ref 70–99)
Glucose-Capillary: 87 mg/dL (ref 70–99)

## 2021-09-21 LAB — PROTIME-INR
INR: 1.7 — ABNORMAL HIGH (ref 0.8–1.2)
Prothrombin Time: 19.7 seconds — ABNORMAL HIGH (ref 11.4–15.2)

## 2021-09-21 LAB — TROPONIN I (HIGH SENSITIVITY): Troponin I (High Sensitivity): 44 ng/L — ABNORMAL HIGH (ref <18)

## 2021-09-21 LAB — URIC ACID: Uric Acid, Serum: 9.5 mg/dL — ABNORMAL HIGH (ref 3.7–8.6)

## 2021-09-21 LAB — LACTIC ACID, PLASMA: Lactic Acid, Venous: 8.8 mmol/L (ref 0.5–1.9)

## 2021-09-21 MED ORDER — FUROSEMIDE 10 MG/ML IJ SOLN: 120.0000 mg | Freq: Two times a day (BID) | INTRAVENOUS | Status: DC

## 2021-09-21 MED ORDER — ALBUTEROL SULFATE (2.5 MG/3ML) 0.083% IN NEBU
INHALATION_SOLUTION | RESPIRATORY_TRACT | Status: AC
  Filled 2021-09-21: qty 12

## 2021-09-21 MED ORDER — PROCHLORPERAZINE EDISYLATE 10 MG/2ML IJ SOLN
5.0000 mg | Freq: Once | INTRAMUSCULAR | Status: AC
  Administered 2021-09-21: 5 mg via INTRAVENOUS
  Filled 2021-09-21: qty 2

## 2021-09-21 MED ORDER — ORAL CARE MOUTH RINSE: 15.0000 mL | OROMUCOSAL | Status: DC | PRN

## 2021-09-21 MED ORDER — FUROSEMIDE 10 MG/ML IJ SOLN
120.0000 mg | Freq: Three times a day (TID) | INTRAVENOUS | Status: DC
  Administered 2021-09-21: 120 mg via INTRAVENOUS
  Filled 2021-09-21 (×2): qty 12
  Filled 2021-09-21: qty 10
  Filled 2021-09-21 (×2): qty 12

## 2021-09-21 MED ORDER — SODIUM ZIRCONIUM CYCLOSILICATE 10 G PO PACK
10.0000 g | PACK | Freq: Once | ORAL | Status: AC
Start: 2021-09-21 — End: 2021-09-21
  Administered 2021-09-21: 10 g via ORAL
  Filled 2021-09-21: qty 1

## 2021-09-21 MED ORDER — NOREPINEPHRINE 16 MG/250ML-% IV SOLN
0.0000 ug/min | INTRAVENOUS | Status: DC
  Administered 2021-09-21: 10 ug/min via INTRAVENOUS
  Administered 2021-09-22: 39 ug/min via INTRAVENOUS
  Administered 2021-09-22: 49 ug/min via INTRAVENOUS
  Administered 2021-09-22: 60 ug/min via INTRAVENOUS
  Administered 2021-09-22: 40 ug/min via INTRAVENOUS
  Administered 2021-09-23: 80 ug/min via INTRAVENOUS
  Administered 2021-09-23: 42 ug/min via INTRAVENOUS
  Administered 2021-09-23: 60 ug/min via INTRAVENOUS
  Filled 2021-09-21 (×7): qty 250
  Filled 2021-09-21: qty 500

## 2021-09-21 MED ORDER — FENTANYL CITRATE PF 50 MCG/ML IJ SOSY
12.5000 ug | PREFILLED_SYRINGE | INTRAMUSCULAR | Status: AC | PRN
  Administered 2021-09-21 (×2): 12.5 ug via INTRAVENOUS
  Filled 2021-09-21 (×3): qty 1

## 2021-09-21 MED ORDER — INSULIN ASPART 100 UNIT/ML IV SOLN
5.0000 [IU] | Freq: Once | INTRAVENOUS | Status: AC
Start: 2021-09-21 — End: 2021-09-21
  Administered 2021-09-21: 5 [IU] via INTRAVENOUS

## 2021-09-21 MED ORDER — SODIUM CHLORIDE 0.9 % IV SOLN: INTRAVENOUS | Status: AC

## 2021-09-21 MED ORDER — SODIUM CHLORIDE 0.9% FLUSH
10.0000 mL | Freq: Two times a day (BID) | INTRAVENOUS | Status: DC
  Administered 2021-09-22 – 2021-09-23 (×4): 10 mL

## 2021-09-21 MED ORDER — VITAMIN B-12 1000 MCG PO TABS: 1000.0000 ug | ORAL_TABLET | Freq: Every day | ORAL | Status: DC

## 2021-09-21 MED ORDER — SODIUM ZIRCONIUM CYCLOSILICATE 10 G PO PACK
10.0000 g | PACK | Freq: Three times a day (TID) | ORAL | Status: AC
  Administered 2021-09-21: 10 g via ORAL
  Filled 2021-09-21 (×2): qty 1

## 2021-09-21 MED ORDER — ZINC SULFATE 220 (50 ZN) MG PO CAPS
220.0000 mg | ORAL_CAPSULE | Freq: Every day | ORAL | Status: DC
Start: 2021-09-21 — End: 2021-09-22
  Administered 2021-09-21: 220 mg via ORAL
  Filled 2021-09-21: qty 1

## 2021-09-21 MED ORDER — OXYCODONE HCL 5 MG PO TABS
5.0000 mg | ORAL_TABLET | ORAL | Status: DC | PRN
  Administered 2021-09-21: 5 mg via ORAL
  Filled 2021-09-21: qty 1

## 2021-09-21 MED ORDER — ALBUTEROL SULFATE (2.5 MG/3ML) 0.083% IN NEBU
10.0000 mg | INHALATION_SOLUTION | Freq: Once | RESPIRATORY_TRACT | Status: AC
  Administered 2021-09-21: 10 mg via RESPIRATORY_TRACT

## 2021-09-21 MED ORDER — ORAL CARE MOUTH RINSE
15.0000 mL | OROMUCOSAL | Status: DC
  Administered 2021-09-21 – 2021-09-23 (×19): 15 mL via OROMUCOSAL

## 2021-09-21 MED ORDER — VITAMIN C 500 MG PO TABS
500.0000 mg | ORAL_TABLET | Freq: Two times a day (BID) | ORAL | Status: DC
  Administered 2021-09-21: 500 mg via ORAL
  Filled 2021-09-21 (×2): qty 1

## 2021-09-21 MED ORDER — DEXTROSE 50 % IV SOLN
1.0000 | Freq: Once | INTRAVENOUS | Status: AC
  Administered 2021-09-21: 50 mL via INTRAVENOUS

## 2021-09-21 MED ORDER — ZINC 50 MG PO TABS: 1.0000 | ORAL_TABLET | Freq: Every day | ORAL | Status: DC

## 2021-09-21 MED ORDER — ONE-DAILY MULTI VITAMINS PO TABS
1.0000 | ORAL_TABLET | Freq: Every day | ORAL | Status: DC
Start: 2021-09-22 — End: 2021-09-21

## 2021-09-21 MED ORDER — METHOCARBAMOL 500 MG PO TABS
500.0000 mg | ORAL_TABLET | Freq: Three times a day (TID) | ORAL | Status: DC | PRN
Start: 2021-09-21 — End: 2021-09-23
  Administered 2021-09-21: 500 mg via ORAL
  Filled 2021-09-21: qty 1

## 2021-09-21 MED ORDER — CHLORHEXIDINE GLUCONATE CLOTH 2 % EX PADS
6.0000 | MEDICATED_PAD | Freq: Every day | CUTANEOUS | Status: DC
  Administered 2021-09-21: 6 via TOPICAL

## 2021-09-21 MED ORDER — DEXTROSE 50 % IV SOLN
INTRAVENOUS | Status: AC
  Filled 2021-09-21: qty 50

## 2021-09-21 MED ORDER — NOREPINEPHRINE 4 MG/250ML-% IV SOLN: 0.0000 ug/min | INTRAVENOUS | Status: DC

## 2021-09-21 MED ORDER — VASOPRESSIN 20 UNITS/100 ML INFUSION FOR SHOCK
0.0000 [IU]/min | INTRAVENOUS | Status: DC
  Administered 2021-09-21 – 2021-09-23 (×3): 0.03 [IU]/min via INTRAVENOUS
  Filled 2021-09-21 (×3): qty 100

## 2021-09-21 MED ORDER — DEXTROSE 50 % IV SOLN
1.0000 | Freq: Once | INTRAVENOUS | Status: AC
  Administered 2021-09-21: 50 mL via INTRAVENOUS
  Filled 2021-09-21: qty 50

## 2021-09-21 MED ORDER — NOREPINEPHRINE 4 MG/250ML-% IV SOLN
INTRAVENOUS | Status: AC
  Administered 2021-09-21: 10 ug/min via INTRAVENOUS
  Filled 2021-09-21: qty 250

## 2021-09-21 MED ORDER — VASOPRESSIN 20 UNITS/100 ML INFUSION FOR SHOCK
INTRAVENOUS | Status: AC
  Administered 2021-09-22: 0.03 [IU]/min via INTRAVENOUS
  Filled 2021-09-21: qty 100

## 2021-09-21 MED ORDER — INSULIN ASPART 100 UNIT/ML IV SOLN
5.0000 [IU] | Freq: Once | INTRAVENOUS | Status: AC
  Administered 2021-09-21: 5 [IU] via INTRAVENOUS

## 2021-09-21 MED ORDER — EPINEPHRINE 1 MG/10ML IJ SOSY
PREFILLED_SYRINGE | INTRAMUSCULAR | Status: AC
  Filled 2021-09-21: qty 40

## 2021-09-21 MED ORDER — EPINEPHRINE PF 1 MG/ML IJ SOLN
INTRAMUSCULAR | Status: AC
  Filled 2021-09-21: qty 1

## 2021-09-21 MED ORDER — CALCIUM CHLORIDE 10 % IV SOLN
INTRAVENOUS | Status: AC
  Filled 2021-09-21: qty 10

## 2021-09-21 MED ORDER — SODIUM CHLORIDE 0.9% FLUSH: 10.0000 mL | INTRAVENOUS | Status: DC | PRN

## 2021-09-21 MED ORDER — OXYCODONE HCL 5 MG PO TABS
5.0000 mg | ORAL_TABLET | Freq: Four times a day (QID) | ORAL | Status: DC | PRN
  Administered 2021-09-21: 5 mg via ORAL
  Filled 2021-09-21: qty 1

## 2021-09-21 MED ORDER — ADULT MULTIVITAMIN W/MINERALS CH: 1.0000 | ORAL_TABLET | Freq: Every day | ORAL | Status: DC

## 2021-09-21 MED ORDER — SODIUM BICARBONATE 8.4 % IV SOLN
50.0000 meq | Freq: Once | INTRAVENOUS | Status: AC
  Administered 2021-09-21: 50 meq via INTRAVENOUS

## 2021-09-21 MED ORDER — SODIUM BICARBONATE 8.4 % IV SOLN
INTRAVENOUS | Status: AC
  Filled 2021-09-21: qty 50

## 2021-09-21 NOTE — TOC Initial Note (Signed)
Transition of Care [facility]) - Initial/Assessment Note    Patient Details  Name: [place] Fassnacht MRN: [id number] Date of Birth: 1960/08/30  Transition of Care [facility]) CM/SW Contact:    Leeroy Cha, RN Phone Number: 09/21/2021, 8:12 AM  Clinical Narrative:                  Transition of Care [facility]) Screening Note   Patient Details  Name: Ramey Schiff Date of Birth: 02-12-1960   Transition of Care [facility]) CM/SW Contact:    Leeroy Cha, RN Phone Number: 09/21/2021, 8:12 AM    Transition of Care Department [facility]) has reviewed patient and no TOC needs have been identified at this time. We will continue to monitor patient advancement through interdisciplinary progression rounds. If new patient transition needs arise, please place a TOC consult.    Expected Discharge Plan: Home/Self Care Barriers to Discharge: Continued Medical Work up   Patient Goals and CMS Choice        Expected Discharge Plan and Services Expected Discharge Plan: Home/Self Care   Discharge Planning Services: CM Consult   Living arrangements for the past 2 months: Single Family Home                                      Prior Living Arrangements/Services Living arrangements for the past 2 months: Single Family Home Lives with:: Spouse Patient language and need for interpreter reviewed:: Yes Do you feel safe going back to the place where you live?: Yes            Criminal Activity/Legal Involvement Pertinent to Current Situation/Hospitalization: No - Comment as needed  Activities of Daily Living Home Assistive Devices/Equipment: Eyeglasses (reading glasses) ADL Screening (condition at time of admission) Patient's cognitive ability adequate to safely complete daily activities?: Yes Is the patient deaf or have difficulty hearing?: No Does the patient have difficulty seeing, even when wearing glasses/contacts?: No Does the patient have difficulty concentrating, remembering, or  making decisions?: No Patient able to express need for assistance with ADLs?: Yes Does the patient have difficulty dressing or bathing?: No Independently performs ADLs?: Yes (appropriate for developmental age) Does the patient have difficulty walking or climbing stairs?: Yes Weakness of Legs: Both Weakness of Arms/Hands: Both  Permission Sought/Granted                  Emotional Assessment Appearance:: Appears stated age Attitude/Demeanor/Rapport: Engaged Affect (typically observed): Calm Orientation: : Oriented to Self, Oriented to Place, Oriented to  Time, Oriented to Situation Alcohol / Substance Use: Alcohol Use (etoh occasional) Psych Involvement: No (comment)  Admission diagnosis:  Dehydration [E86.0] AKI (acute kidney injury) ([place]) [N17.9] Patient Active Problem List   Diagnosis Date Noted   Diffuse lymphadenopathy 10/03/2021   Hyperkalemia 09/28/2021   Hyponatremia 10/10/2021   Abnormal LFTs 09/24/2021   Thrombocytopenia ([place]) 09/29/2021   Nausea and vomiting 10/03/2021   Piriformis syndrome 09/12/2021   AKI (acute kidney injury) ([place]) 09/12/2021   Class 2 obesity 09/12/2021   Hypercalcemia of malignancy 09/11/2021   CKD (chronic kidney disease) stage 3, GFR 30-59 ml/min ([place]) 12/01/2020   Deterioration in renal function 11/03/2020   Rheumatoid arthritis involving multiple sites ([place]) 11/05/2018   Cholelithiases 02/04/2018   Hypogonadism in male 07/01/2017   Fatty liver 05/22/2016   Morbid obesity ([place])- BMI 35+ with htn, hyperlipidemia 01/11/2014   Medication management 06/21/2013   Vitamin D  deficiency 06/21/2013   Hypertension    Abnormal glucose    Hyperlipidemia, mixed    Arthritis    Idiopathic gout    PCP:  Unk Pinto, MD Pharmacy:   [facility], [place] [address] Phone: [phone] Fax: [phone]  [facility], [place] to [address]istered [place] [address] Phone: [phone] Fax: [place], [place] [place] [address] Phone: [phone] Fax: [phone]  [address] [place] [address] Phone: [phone] Fax: [phone]     Social Determinants of Health (SDOH) Interventions Food Insecurity Interventions: Patient Refused Housing Interventions: Patient Refused Transportation Interventions: Patient Refused  Readmission Risk Interventions   No data to display

## 2021-09-21 NOTE — Progress Notes (Signed)
   09/21/21 2133  Clinical Encounter Type  Visited With Patient not available  Visit Type Code  Referral From Nurse  Consult/Referral To Chaplain   Chaplain responded to a code blue on 4N6. Patient was resuscitated. No family was present.  If a chaplain is requested someone will respond.   [facility]  [phone]

## 2021-09-21 NOTE — Consult Note (Signed)
NAME:  Thomas Waller, MRN:  [id number], DOB:  [birth year], LOS: 1 ADMISSION DATE:  09/19/2021, CONSULTATION DATE:  09/21/21 REFERRING MD:  TRH, CHIEF COMPLAINT:  post arrest   History of Present Illness:  61 yo male with pmh DJD, gout, hyperlipidemia, htn, ra and recent dx of lymphoma who presented with AKI, Cr 4.2 and multiple other metabolic derangements incluind metabolic acidosis, hyperkalemia and hyponatremia. Oncology and nephrology consulted for eval on 9/6 however this evening he suffered a cardiac arrest. Please see code documentation for complete details. ROSC was achieved, he was temporized for presumptive hyperkalemia. He was intubated and transferred to ICU. At this time he remains on pressors, repeat labs are similar to previous, LA 8.8, transaminase are expectedly elevated post arrest.    All history is obtained from chart at this time as pt is intubated and sedated.   Pertinent  Medical History  H/o RA H/o gout Htn Lymphoma, under eval  Significant Hospital Events: Including procedures, antibiotic start and stop dates in addition to other pertinent events   Admitted 9/5 with n/v 2/2 aki 9/6 cardiac arrest  Interim History / Subjective:  As above  Objective   Blood pressure (!) 73/36, pulse 100, temperature (!) 97.5 F (36.4 C), resp. rate 16, height '5\' 10"'$  (1.778 m), SpO2 94 %.    Vent Mode: PRVC FiO2 (%):  [60 %] 60 % Vt Set:  [580 mL] 580 mL PEEP:  [5 cmH20] 5 cmH20   Intake/Output Summary (Last 24 hours) at 09/21/2021 2320 Last data filed at 09/21/2021 2300 Gross per 24 hour  Intake 214.8 ml  Output 80 ml  Net 134.8 ml   There were no vitals filed for this visit.  Examination: General: sedated, unresponsive, intubated HENT: ncat, perrla, mmmp Lungs: coarse rhonchi bilaterally Cardiovascular: tachycardia, reg rhythm Abdomen: obese, distended, unable to elicit tenderness Extremities: gross edema with chronic LE skin changes  Neuro: unresponsive post  arrest GU: deferred  Resolved Hospital Problem list     Assessment & Plan:  Ihca s/p rosc:  -monitor closely in icu -will likely need cvc -echo in am -suspect etiology is the multiple metabolic derrangements.  Shock, suspect cardiogenic:  -echo in am.  -titrate pressors to map >65 -cvc/aline  Hyperkalemia:  Hypercalcemia:  Metabolic acidosis:  Aki:  -nephrology following -temporize K -suspect he will need crrt  Suspected high grade Lymphoma:  Possible tumor lysis syndrome -suspected and still under evaluation -awaiting surgical bx   Best Practice (right click and "Reselect all SmartList Selections" daily)   Diet/type: NPO DVT prophylaxis: prophylactic heparin  GI prophylaxis: PPI Lines: yes and it is still needed Foley:  Yes, and it is still needed Code Status:  full code Last date of multidisciplinary goals of care discussion [pending]  Labs   CBC: Recent Labs  Lab 09/15/21 0432 09/15/21 2013 10/15/2021 1147 09/21/21 0610 09/21/21 2206 09/21/21 2233  WBC 16.2* 15.9* 22.8* 18.9* 22.4*  --   HGB 15.9 16.3 16.2 15.2 14.1 14.3  HCT 47.7 46.4 45.0 42.7 40.1 42.0  MCV 90.0 86.4 83.3 83.4 86.8  --   PLT 164 148* 67* 53* 35*  --     Basic Metabolic Panel: Recent Labs  Lab 09/15/21 2013 09/25/2021 1147 09/17/2021 1550 09/28/2021 2225 09/21/21 0610 09/21/21 1514 09/21/21 2233  NA 132* 126*  --  130* 128* 127* 125*  K 5.2* 6.1* 5.9* 6.3* 5.5* 5.7* 5.6*  CL 99 97*  --  100 97* 96*  --   CO2  22 18*  --  18* 19* 16*  --   GLUCOSE 95 85  --  74 79 80  --   BUN 51* 105*  --  104* 119* 126*  --   CREATININE 2.07* 3.86*  --  4.27* 4.76* 5.57*  --   CALCIUM 12.0* 11.2*  --  10.9* 10.8* 10.6*  --    GFR: Estimated Creatinine Clearance: 17.8 mL/min (A) (by C-G formula based on SCr of 5.57 mg/dL (H)). Recent Labs  Lab 09/15/21 2013 10/11/2021 1147 09/21/21 0610 09/21/21 2206  WBC 15.9* 22.8* 18.9* 22.4*  LATICACIDVEN  --   --   --  8.8*    Liver Function  Tests: Recent Labs  Lab 09/15/21 0432 09/15/21 2013 09/27/2021 1147 09/21/21 0610  AST 58* 70* 62* 52*  ALT 65* 74* 68* 65*  ALKPHOS 234* 276* 292* 289*  BILITOT 1.1 1.2 2.9* 2.6*  PROT 5.5* 5.4* 5.2* 5.3*  ALBUMIN 2.7* 2.8* 2.6* 2.7*   Recent Labs  Lab 09/15/21 2013 10/06/2021 1147  LIPASE 28 30   No results for input(s): "AMMONIA" in the last 168 hours.  ABG    Component Value Date/Time   PHART 7.091 (LL) 09/21/2021 2233   PCO2ART 54.1 (H) 09/21/2021 2233   PO2ART 221 (H) 09/21/2021 2233   HCO3 16.5 (L) 09/21/2021 2233   TCO2 18 (L) 09/21/2021 2233   ACIDBASEDEF 14.0 (H) 09/21/2021 2233   O2SAT 99 09/21/2021 2233     Coagulation Profile: Recent Labs  Lab 09/21/21 0610  INR 1.7*    Cardiac Enzymes: No results for input(s): "CKTOTAL", "CKMB", "CKMBINDEX", "TROPONINI" in the last 168 hours.  HbA1C: Hgb A1c MFr Bld  Date/Time Value Ref Range Status  04/04/2021 10:25 AM 5.4 <5.7 % of total Hgb Final    Comment:    For the purpose of screening for the presence of diabetes: . <5.7%       Consistent with the absence of diabetes 5.7-6.4%    Consistent with increased risk for diabetes             (prediabetes) > or =6.5%  Consistent with diabetes . This assay result is consistent with a decreased risk of diabetes. . Currently, no consensus exists regarding use of hemoglobin A1c for diagnosis of diabetes in children. . According to American Diabetes Association (ADA) guidelines, hemoglobin A1c <7.0% represents optimal control in non-pregnant diabetic patients. Different metrics may apply to specific patient populations.  Standards of Medical Care in Diabetes(ADA). Marland Kitchen   04/01/2020 03:22 PM 5.4 <5.7 % of total Hgb Final    Comment:    For the purpose of screening for the presence of diabetes: . <5.7%       Consistent with the absence of diabetes 5.7-6.4%    Consistent with increased risk for diabetes             (prediabetes) > or =6.5%  Consistent with  diabetes . This assay result is consistent with a decreased risk of diabetes. . Currently, no consensus exists regarding use of hemoglobin A1c for diagnosis of diabetes in children. . According to American Diabetes Association (ADA) guidelines, hemoglobin A1c <7.0% represents optimal control in non-pregnant diabetic patients. Different metrics may apply to specific patient populations.  Standards of Medical Care in Diabetes(ADA). .     CBG: Recent Labs  Lab 10/01/2021 2359 09/21/21 2223  GLUCAP 87 121*    Review of Systems:   Unobtainable 2/2 intubated status  Past Medical History:  He,  has a past medical history of Arthritis, Cholelithiases (02/04/2018), Elevated hemoglobin A1c measurement, Gout, Hyperlipidemia, Hypertension, and Rheumatoid arthritis ([place]).   Surgical History:   Past Surgical History:  Procedure Laterality Date   CATARACT EXTRACTION Right 04/2020   Dr. Tommy Rainwater   CATARACT EXTRACTION Left 05/2020   Dr. Tommy Rainwater   WISDOM TOOTH EXTRACTION       Social History:   reports that he has never smoked. He has never used smokeless tobacco. He reports current alcohol use. He reports that he does not use drugs.   Family History:  His family history includes Heart attack in his half-brother.   Allergies Allergies  Allergen Reactions   Zestril [Lisinopril] Cough     Home Medications  Prior to Admission medications   Medication Sig Start Date End Date Taking? Authorizing Provider  allopurinol (ZYLOPRIM) 300 MG tablet Take 300 mg by mouth daily.    Yes [provider]  aspirin EC 81 MG tablet Take 81 mg by mouth daily.   Yes [provider]  Cholecalciferol (VITAMIN D PO) Take 5,000 Units by mouth 2 (two) times daily.   Yes [provider]  ENBREL SURECLICK 50 MG/ML injection Inject 50 mg into the skin once a week. 10/13/13  Yes [provider]  fluocinonide ointment (LIDEX) 8.85 % Apply 1 Application topically 2 (two) times  daily as needed (dermatitis). 10/06/15  Yes [provider]  Magnesium 500 MG TABS Take 1 tablet by mouth daily.   Yes [provider]  Multiple Vitamin (MULTIVITAMIN) tablet Take 1 tablet by mouth daily.   Yes [provider]  Omega-3 Fatty Acids (FISH OIL) 1200 MG CAPS Take by mouth 2 (two) times daily.   Yes [provider]  ondansetron (ZOFRAN) 4 MG tablet Take 1 tablet (4 mg total) by mouth every 6 (six) hours for 30 doses. 09/15/21 2021/10/09 Yes Countryman, Cheri Rous, MD  PREVIDENT 5000 PLUS 1.1 % CREA dental cream Place 1 Application onto teeth at bedtime. 07/22/21  Yes [provider]  testosterone cypionate (DEPOTESTOSTERONE CYPIONATE) 200 MG/ML injection INJECT 1 ML INTRAMUSCULARLY EVERY 7 DAYS. DISCARD THE REMAINDER. Patient taking differently: Inject 200 mg into the muscle once a week. Discard any remainder. 06/14/21  Yes Alycia Rossetti, NP  vitamin B-12 (CYANOCOBALAMIN) 1000 MCG tablet Take 1,000 mcg by mouth daily.   Yes [provider]  vitamin C (ASCORBIC ACID) 500 MG tablet Take 500 mg by mouth 2 (two) times daily.   Yes [provider]  Zinc 50 MG TABS Take 1 tablet by mouth daily.   Yes [provider]  SYRINGE-NEEDLE, DISP, 3 ML (B-D 3CC LUER-LOK SYR 21GX1") 21G X 1" 3 ML MISC Use 1 syringe with injection 06/14/21   Alycia Rossetti, NP     Critical care time: 33mn excluding procedures

## 2021-09-21 NOTE — Progress Notes (Signed)
Pt became agitated stating he was going home and he would "rather be unsafe than stay here". Provider was notified that patient wanted to leave AMA. Bedside nurse educated patient on the risks of him leaving in the condition he is in. Patients emergency contact, Corky Sox (spouse) was called and informed on patient's agitation. She will be here shortly. Providers to round soon.

## 2021-09-21 NOTE — Code Documentation (Signed)
  Patient Name: Thomas Waller   MRN: [id number]   Date of Birth/ Sex: [birth year] , male      Admission Date: 10/09/2021  Attending Provider: Antonieta Pert, MD  Primary Diagnosis: AKI (acute kidney injury) [facility])   Indication: Pt was in his usual state of health until this PM, when he was noted to be asystole. Code blue was subsequently called. At the time of arrival on scene, ACLS protocol was underway.   Technical Description:  - CPR performance duration:  20 minutes  - Was defibrillation or cardioversion used? No   - Was external pacer placed? No  - Was patient intubated pre/post CPR? Yes   Medications Administered: Y = Yes; Blank = No Amiodarone    Atropine    Calcium  Y  Epinephrine  Y  Lidocaine    Magnesium    Norepinephrine    Phenylephrine    Sodium bicarbonate  Y  Vasopressin    Dextrose         Y  Post CPR evaluation:  - Final Status - Was patient successfully resuscitated ? Yes - What is current rhythm? NSR - What is current hemodynamic status? HDS  Miscellaneous Information:  - Labs sent, including: ABG, BMP, CBC, lactate  - Primary team notified?  Yes, discussed with Dr. [place] Crane.  - Family Notified? Called but unable to reach. Dr. [place] Crane to contact family.  - Additional notes/ transfer status: Patient admitted with renal failure and hyperkalemia, was found to be in asystole at 2130. CPR performed for about 20 minutes with epi x4, bicarb x2, calcium chloride x2, dextrose x1. ROSC achieved at 2150. Transferred to 3M12, PCCM to take over care.     Virl Axe, MD  09/21/2021, 10:20 PM

## 2021-09-21 NOTE — Consult Note (Addendum)
Renal Service Consult Note [facility]  Macgregor Aeschliman 09/21/2021 Sol Blazing, MD Requesting Physician: Dr. Lupita Leash  Reason for Consult: Renal failure HPI: The patient is a 61 y.o. year-old w/ hx of DJD, gout, HL, HTN , RA who was admitted here 8/27- 8/31 for hypercalcemia of malignancy, suspected lymphoma, AKI creat 1.9 at dc, ^LFT's. Pt rec'd IVF"s, calcitonin and Zometa and Ca improved from 15 > 11 at dc. CT showed diffuse LAN in abdomen/ pelvis and chest. R inguinal LN bx was done and pt dc'd. Pt returned to ED w/ abd pain and N/V  yesterday. In ED BP's were soft 105/ 75, HR 105, afebrile. Labs showed WBC ^18k, plts down, creat up 4.2, K 6.3, Na 130, Ca 10.9, alb 2.7, uric acid 9.5.  We are asked to see for AKI.    Pt seen in room. Pt is somnolent and forgetful. Poor historian. Wants to go home. Denies N/V or SOB.    ROS - denies CP, no joint pain, no HA, no blurry vision, no rash   Past Medical History  Past Medical History:  Diagnosis Date   Arthritis    [place] Polyarthritis   Cholelithiases 02/04/2018   Per Abd [place] 01/2018 with fatty liver, no inflammation or obstruction   Elevated hemoglobin A1c measurement    Gout    Hyperlipidemia    Hypertension    Rheumatoid arthritis [facility])    Past Surgical History  Past Surgical History:  Procedure Laterality Date   CATARACT EXTRACTION Right 04/2020   Dr. Tommy Rainwater   CATARACT EXTRACTION Left 05/2020   Dr. Tommy Rainwater   WISDOM TOOTH EXTRACTION     Family History  Family History  Problem Relation Age of Onset   Heart attack Half-Brother    Social History  reports that he has never smoked. He has never used smokeless tobacco. He reports current alcohol use. He reports that he does not use drugs. Allergies  Allergies  Allergen Reactions   Zestril [Lisinopril] Cough   Home medications Prior to Admission medications   Medication Sig Start Date End Date Taking? Authorizing Provider  allopurinol (ZYLOPRIM) 300 MG tablet  Take 300 mg by mouth daily.    Yes [provider]  aspirin EC 81 MG tablet Take 81 mg by mouth daily.   Yes [provider]  Cholecalciferol (VITAMIN D PO) Take 5,000 Units by mouth 2 (two) times daily.   Yes [provider]  ENBREL SURECLICK 50 MG/ML injection Inject 50 mg into the skin once a week. 10/13/13  Yes [provider]  fluocinonide ointment (LIDEX) 2.95 % Apply 1 Application topically 2 (two) times daily as needed (dermatitis). 10/06/15  Yes [provider]  Magnesium 500 MG TABS Take 1 tablet by mouth daily.   Yes [provider]  Multiple Vitamin (MULTIVITAMIN) tablet Take 1 tablet by mouth daily.   Yes [provider]  Omega-3 Fatty Acids (FISH OIL) 1200 MG CAPS Take by mouth 2 (two) times daily.   Yes [provider]  ondansetron (ZOFRAN) 4 MG tablet Take 1 tablet (4 mg total) by mouth every 6 (six) hours for 30 doses. 09/15/21 10-08-21 Yes Countryman, Cheri Rous, MD  PREVIDENT 5000 PLUS 1.1 % CREA dental cream Place 1 Application onto teeth at bedtime. 07/22/21  Yes [provider]  testosterone cypionate (DEPOTESTOSTERONE CYPIONATE) 200 MG/ML injection INJECT 1 ML INTRAMUSCULARLY EVERY 7 DAYS. DISCARD THE REMAINDER. Patient taking differently: Inject 200 mg into the muscle once a week. Discard  any remainder. 06/14/21  Yes Alycia Rossetti, NP  vitamin B-12 (CYANOCOBALAMIN) 1000 MCG tablet Take 1,000 mcg by mouth daily.   Yes [provider]  vitamin C (ASCORBIC ACID) 500 MG tablet Take 500 mg by mouth 2 (two) times daily.   Yes [provider]  Zinc 50 MG TABS Take 1 tablet by mouth daily.   Yes [provider]  SYRINGE-NEEDLE, DISP, 3 ML (B-D 3CC LUER-LOK SYR 21GX1") 21G X 1" 3 ML MISC Use 1 syringe with injection 06/14/21   Alycia Rossetti, NP     Vitals:   10/09/2021 2204 09/21/21 0022 09/21/21 0410 09/21/21 0800  BP: 104/60 101/67 112/74 90/60  Pulse: (!) 101 100 (!) 101 (!) 106   Resp: '18 19 20 19  ' Temp: 98 F (36.7 C) 97.7 F (36.5 C) 98 F (36.7 C) 98.2 F (36.8 C)  TempSrc: Oral Oral Oral Oral  SpO2: 98% 98% 94% 92%   Exam Gen pt somnolent, arouses, mild confusion No rash, cyanosis or gangrene Sclera anicteric, throat clear  No jvd or bruits Chest clear bilat to bases, no rales/ wheezing RRR no MRG Abd soft ntnd no mass or ascites +bs obese GU foley draining small amts dark amber urine MS no joint effusions or deformity Ext diffuse 2+ pitting edema of LE's from ankles to hips Neuro as above, nonfocal, confused about details, somnolent   Home meds include - allopurinol, aspirin, oxycodone IR, vits/ supps/  prns    9/5 > UA - cloudy, 30 prot, 0-5 rbc, 21-50 wbc   9/5 > UNa < 10, UCr 162    BP's soft 100/ 70 range, HR 105, RR 18, temp 98    WBC 18k  Hb 15  plt 53    Na 128  K 5.5  CO2 19  BN 119  Cr  4.76   Ca 10.8  Alb 2.7  Uric acid 9.5    AST 52  ALT 65  Tbili 2.6  LDH  571        Date   Creat  eGFR    2014- 2021  0.97- 1.30    2022   1.36- 1.54 57     March 2023  1.27  > 60    July 2023  1.28  > 60 ml/min    8/24- 09/15/21 1.50- 2.06 36- 46 ml/min    10/13/2021  3.86, 4.27      9/6   4.76     Assessment/ Plan: AKI - in pt w/ recently discovered suspected lymphoma. Recent admit AKI creat 1.5- 2.0 related to [place] levels. Calcium levels were corrected but pt now returns w/ worsening renal failure and vol overload. Uric acid is not severely elevated (9.5) so TLS seems unlikely. UNa low which suggests low effective art blood vol (low alb or poss pelvic malignancy causing LE edema). Platelets are low, may have TMA related to the underlying cancer. Oncology following. Suspect he will continue to decline and need HD soon, recommend transfer to [facility]. Have d/w pmd.    Lymphadenopathy - abd/ pelvis/ chest by CT last admit, sp LN biopsy. Onc following. Suspected lymphoma.  Vol overload - will give IV lasix 13m today, see if will respond Hyperkalemia -  would use renal diet if eating, cont lokelma until K+ < 5      Rob Ruthvik Barnaby  MD 09/21/2021, 9:21 AM Recent Labs  Lab 10/15/2021 1147 10/13/2021 1550 10/09/2021 2225 09/21/21 0610  HGB 16.2  --   --  15.2  ALBUMIN 2.6*  --   --  2.7*  CALCIUM 11.2*  --  10.9* 10.8*  CREATININE 3.86*  --  4.27* 4.76*  K 6.1*   < > 6.3* 5.5*   < > = values in this interval not displayed.

## 2021-09-21 NOTE — Procedures (Signed)
Central Venous Catheter Insertion Procedure Note  Eulis Salazar  [id number]  [birth year]  Date:09/21/21  Time:11:25 PM   Provider Performing:Yaritzy Huser Jerilynn Mages Ayesha Rumpf   Procedure: Insertion of Non-tunneled Central Venous Catheter(36556) with US guidance (93716)   Indication(s) Medication administration and Difficult access  Consent Unable to obtain consent due to emergent nature of procedure.  Anesthesia Topical only with 1% lidocaine   Timeout Verified patient identification, verified procedure, site/side was marked, verified correct patient position, special equipment/implants available, medications/allergies/relevant history reviewed, required imaging and test results available.  Sterile Technique Maximal sterile technique including full sterile barrier drape, hand hygiene, sterile gown, sterile gloves, mask, hair covering, sterile ultrasound probe cover (if used).  Procedure Description Area of catheter insertion was cleaned with chlorhexidine and draped in sterile fashion.  With real-time ultrasound guidance a central venous catheter was placed into the left internal jugular vein. Nonpulsatile blood flow and easy flushing noted in all ports.  The catheter was sutured in place and sterile dressing applied.     Complications/Tolerance None; patient tolerated the procedure well. Chest X-ray is ordered to verify placement for internal jugular or subclavian cannulation.   Chest x-ray is not ordered for femoral cannulation.  EBL Minimal  Specimen(s) None  Lestine Mount, [facility] 09/21/21 11:25 PM  Please see [email or web address] for pager details.  From 7A-7P if no response, please call [phone] After hours, please call ELink [phone]

## 2021-09-21 NOTE — Evaluation (Signed)
Occupational Therapy Evaluation Patient Details Name: Thomas Waller MRN: [id number] DOB: Apr 12, 1960 Today's Date: 09/21/2021   History of Present Illness Thomas Waller is a 61 y.o. male admitted 10/10/2021 with ongoing complaints of nausea and vomiting over the past several days with abdominal pain/constipation Dx with with AKI and worsening creatinine compared to last week. PMH includes essential hypertension, obesity, gout, rheumatoid arthritis on Enbrel. Of note: Recently hospitalized Aug 28-09/16/21 with complaints of fatigue nausea abdominal pain.  He was found to have diffuse lymphadenopathy, hypercalcemic, AKI, and  underwent biopsy of one of his inguinal lymph nodes (per medical oncology they are not sufficient to initiate treatment). Pt will need an excisional biopsy of one of his lymph nodes.   Clinical Impression   Pt is unreliable historian, lethargic, and times of restlessness during session. Expressing frustration about NPO status despite education from [employer] staff and therapy staff. Mostly cooperative with therapy throughout session. He reports that typically he is independent in ADL and mobility (without DME) at baseline.  Today he ranged from mod A to min A +2 for safety and boost with transfers with RW (which he requested) he was able to perform standing grooming tasks at sink with multimodal cues for attention to task, sequencing, and initiation. Pt will benefit from skilled OT in the acute setting as well as afterwards at the [facility] level. Depending on what level of assist can be provided post-acute he will need at least HHOT (and we will see if cognition improves) he will need 24 hour supervision for safety.     Recommendations for follow up therapy are one component of a multi-disciplinary discharge planning process, led by the attending physician.  Recommendations may be updated based on patient status, additional functional criteria and insurance authorization.   Follow Up  Recommendations  Home health OT (watch closely for progress)    Assistance Recommended at Discharge Frequent or constant Supervision/Assistance  Patient can return home with the following A little help with walking and/or transfers;A lot of help with bathing/dressing/bathroom;Assistance with cooking/housework;Direct supervision/assist for medications management;Direct supervision/assist for financial management;Assist for transportation;Help with stairs or ramp for entrance    Functional Status Assessment  Patient has had a recent decline in their functional status and demonstrates the ability to make significant improvements in function in a reasonable and predictable amount of time.  Equipment Recommendations  BSC/3in1    Recommendations for Other Services PT consult;Speech consult     Precautions / Restrictions Precautions Precautions: Fall Restrictions Weight Bearing Restrictions: No      Mobility Bed Mobility               General bed mobility comments: Pt received in recliner upon entry, returned to recliner at end of session    Transfers Overall transfer level: Needs assistance Equipment used: Rolling walker (2 wheels) Transfers: Sit to/from Stand Sit to Stand: Min assist, Mod assist (mod +2 initially, progressed to min A +2)           General transfer comment: Pt able to scoot hips to edge of chair, cues for hand placement and use of B UEs for power up to stand. Pt requesting use of RW though he does not use AD at baseline.      Balance Overall balance assessment: Needs assistance Sitting-balance support: Feet supported Sitting balance-Leahy Scale: Good     Standing balance support: Bilateral upper extremity supported, Reliant on assistive device for balance Standing balance-Leahy Scale: Poor  ADL either performed or assessed with clinical judgement   ADL Overall ADL's : Needs  assistance/impaired Eating/Feeding: NPO   Grooming: Oral care;Wash/dry face;Minimal assistance;Standing Grooming Details (indicate cue type and reason): assist to open toothbrush, puton toothpaste. seems more cognition/attention than physical ability Upper Body Bathing: Moderate assistance   Lower Body Bathing: Moderate assistance   Upper Body Dressing : Minimal assistance   Lower Body Dressing: Maximal assistance   Toilet Transfer: Moderate assistance;+2 for safety/equipment;Cueing for safety;Cueing for sequencing;Ambulation;Rolling walker (2 wheels) Toilet Transfer Details (indicate cue type and reason): decreased initiation, needing multimodal cues for sequencing         Functional mobility during ADLs: Minimal assistance;+2 for safety/equipment;Moderate assistance;Cueing for safety;Cueing for sequencing;Rolling walker (2 wheels)       Vision Baseline Vision/History: 1 Wears glasses Ability to See in Adequate Light: 0 Adequate       Perception     Praxis      Pertinent Vitals/Pain Pain Assessment Pain Assessment: Faces Faces Pain Scale: Hurts even more Pain Location: abdomen and back Pain Descriptors / Indicators: Grimacing, Discomfort Pain Intervention(s): Monitored during session, Repositioned     Hand Dominance Right   Extremity/Trunk Assessment Upper Extremity Assessment Upper Extremity Assessment: Generalized weakness   Lower Extremity Assessment Lower Extremity Assessment: Defer to PT evaluation   Cervical / Trunk Assessment Cervical / Trunk Assessment: Normal   Communication Communication Communication: No difficulties   Cognition Arousal/Alertness: Lethargic Behavior During Therapy: Flat affect, Agitated Overall Cognitive Status: Impaired/Different from baseline Area of Impairment: Attention, Memory, Safety/judgement, Following commands, Awareness, Problem solving                   Current Attention Level: Sustained Memory: Decreased  short-term memory Following Commands: Follows one step commands consistently, Follows one step commands with increased time Safety/Judgement: Decreased awareness of safety Awareness: Emergent Problem Solving: Slow processing, Difficulty sequencing, Requires verbal cues General Comments: intermttently agitated/frustrated with therapist asking questions about PLOF/home environment. lethargic with eyes closed muyltiple times while seated in recliner. Inconsistent answers to home environment questions, first stating that he has no stairs to enter and then stating 2 steps to enter home. Reports he is typically independent at baseline.     General Comments       Exercises     Shoulder Instructions      Home Living Family/patient expects to be discharged to:: Private residence Living Arrangements: Spouse/significant other Available Help at Discharge: Family Type of Home: House Home Access: Stairs to enter Technical brewer of Steps: 2   Home Layout: One level     Bathroom Shower/Tub: Teacher, early years/pre: [place]: None   Additional Comments: no family present to confirm home set up or PLOF. Pt unreliable historian often providing contradictory information      Prior Functioning/Environment Prior Level of Function : Independent/Modified Independent             Mobility Comments: reports no AD or supplemental O2 use at baseline. Denies history of falls ADLs Comments: reports independent        OT Problem List: Decreased strength;Decreased activity tolerance;Impaired balance (sitting and/or standing);Decreased cognition;Decreased safety awareness;Decreased knowledge of use of DME or AE;Decreased knowledge of precautions;Obesity;Pain      OT Treatment/Interventions: Self-care/ADL training;DME and/or AE instruction;Therapeutic activities;Cognitive remediation/compensation;Patient/family education    OT Goals(Current goals can be found  in the care plan section) Acute Rehab OT Goals Patient Stated Goal: get [facility]  OT Goal Formulation: With patient Time For Goal Achievement: 10/05/21 Potential to Achieve Goals: Good ADL Goals Pt Will Perform Grooming: with supervision;standing Pt Will Perform Upper Body Dressing: with modified independence;sitting Pt Will Perform Lower Body Dressing: with supervision;sit to/from stand Pt Will Transfer to Toilet: ambulating;with supervision Additional ADL Goal #1: Pt will verbalize 3 cognitive strategies for ADL with no cues  OT Frequency: Min 2X/week    Co-evaluation PT/OT/SLP Co-Evaluation/Treatment: Yes Reason for Co-Treatment: Necessary to address cognition/behavior during functional activity;For patient/therapist safety;To address functional/ADL transfers PT goals addressed during session: Mobility/safety with mobility;Balance;Strengthening/ROM;Proper use of DME OT goals addressed during session: ADL's and self-care;Strengthening/ROM;Proper use of Adaptive equipment and DME      AM-PAC OT "6 Clicks" Daily Activity     Outcome Measure Help from another person eating meals?: Total (NPO) Help from another person taking care of personal grooming?: A Little Help from another person toileting, which includes using toliet, bedpan, or urinal?: A Lot Help from another person bathing (including washing, rinsing, drying)?: A Lot Help from another person to put on and taking off regular upper body clothing?: A Little Help from another person to put on and taking off regular lower body clothing?: A Lot 6 Click Score: 13   End of Session Equipment Utilized During Treatment: Gait belt;Rolling walker (2 wheels);Oxygen (2L at end of session) Nurse Communication: Mobility status  Activity Tolerance: Patient limited by lethargy Patient left: in chair;with call bell/phone within reach;with chair alarm set  OT Visit Diagnosis: Unsteadiness on feet (R26.81);Muscle weakness (generalized)  (M62.81);Other symptoms and signs involving cognitive function                Time: 3016-0109 OT Time Calculation (min): 26 min Charges:  OT General Charges $OT Visit: 1 Visit OT Evaluation $OT Eval Moderate Complexity: [place] OTR/L Acute Rehabilitation Services Office: [place] 09/21/2021, 12:20 PM

## 2021-09-21 NOTE — Progress Notes (Signed)
IP PROGRESS NOTE  Subjective:   Events noted overnight.  Patient continues to report back pain and abdominal discomfort.  Foley catheter in place with minimal urine output.  He denies any fevers or chills or sweats.  He is not able to engage in regular conversation and asking to be discharged.  Objective:  Vital signs in last 24 hours: Temp:  [97.5 F (36.4 C)-98 F (36.7 C)] 98 F (36.7 C) (09/06 0410) Pulse Rate:  [92-116] 101 (09/06 0410) Resp:  [16-25] 20 (09/06 0410) BP: (97-161)/(51-89) 112/74 (09/06 0410) SpO2:  [94 %-98 %] 94 % (09/06 0410) Weight change:     Intake/Output from previous day: 09/05 0701 - 09/06 0700 In: 2117.4 [I.V.:17.4; IV Piggyback:2100] Out: 56 [Urine:80] General: Sleepy but arousable and answers questions.  Not in any distress. Head: Normocephalic atraumatic. Mouth: mucous membranes moist, pharynx normal without lesions Eyes: No scleral icterus.  Pupils are equal and round reactive to light. Resp: clear to auscultation bilaterally without rhonchi or wheezes or dullness to percussion. Cardio: regular rate and rhythm, S1, S2 normal, no murmur, click, rub or gallop GI: soft, non-tender; bowel sounds normal; no masses,  no organomegaly Musculoskeletal: No joint deformity or effusion. Neurological: No motor, sensory deficits.  Intact deep tendon reflexes. Skin: No rashes or lesions.  Lab Results: Recent Labs    10/11/2021 1147 09/21/21 0610  WBC 22.8* 18.9*  HGB 16.2 15.2  HCT 45.0 42.7  PLT 67* 53*    BMET Recent Labs    10/10/2021 1147 09/28/2021 1550 10/01/2021 2225  NA 126*  --  130*  K 6.1* 5.9* 6.3*  CL 97*  --  100  CO2 18*  --  18*  GLUCOSE 85  --  74  BUN 105*  --  104*  CREATININE 3.86*  --  4.27*  CALCIUM 11.2*  --  10.9*    Studies/Results: VAS [place] LOWER EXTREMITY VENOUS (DVT)  Result Date: 09/19/2021  Lower Venous DVT Study Patient Name:  Thomas Waller  Date of Exam:   10/05/2021 Medical Rec #: [id number]        Accession #:     [id number] Date of Birth: 06-Aug-1960        Patient Gender: M Patient Age:   14 years Exam Location:  [facility] Procedure:      VAS [place] LOWER EXTREMITY VENOUS (DVT) Referring Phys: Bonnielee Haff --------------------------------------------------------------------------------  Indications: Edema.  Risk Factors: Cancer. Limitations: Poor ultrasound/tissue interface and body habitus. Comparison Study: No prior studies. Performing Technologist: Oliver Hum RVT  Examination Guidelines: A complete evaluation includes B-mode imaging, spectral Doppler, color Doppler, and power Doppler as needed of all accessible portions of each vessel. Bilateral testing is considered an integral part of a complete examination. Limited examinations for reoccurring indications may be performed as noted. The reflux portion of the exam is performed with the patient in reverse Trendelenburg.  +---------+---------------+---------+-----------+----------+--------------+ RIGHT    CompressibilityPhasicitySpontaneityPropertiesThrombus Aging +---------+---------------+---------+-----------+----------+--------------+ CFV      Full           Yes      Yes                                 +---------+---------------+---------+-----------+----------+--------------+ SFJ      Full                                                        +---------+---------------+---------+-----------+----------+--------------+  FV Prox  Full                                                        +---------+---------------+---------+-----------+----------+--------------+ FV Mid   Full                                                        +---------+---------------+---------+-----------+----------+--------------+ FV Distal               Yes      Yes                                 +---------+---------------+---------+-----------+----------+--------------+ PFV      Full                                                         +---------+---------------+---------+-----------+----------+--------------+ POP      Full           Yes      Yes                                 +---------+---------------+---------+-----------+----------+--------------+ PTV      Full                                                        +---------+---------------+---------+-----------+----------+--------------+ PERO     Full                                                        +---------+---------------+---------+-----------+----------+--------------+   +---------+---------------+---------+-----------+----------+--------------+ LEFT     CompressibilityPhasicitySpontaneityPropertiesThrombus Aging +---------+---------------+---------+-----------+----------+--------------+ CFV      Full           Yes      Yes                                 +---------+---------------+---------+-----------+----------+--------------+ SFJ      Full                                                        +---------+---------------+---------+-----------+----------+--------------+ FV Prox  Full                                                        +---------+---------------+---------+-----------+----------+--------------+  FV Mid   Full                                                        +---------+---------------+---------+-----------+----------+--------------+ FV Distal               Yes      Yes                                 +---------+---------------+---------+-----------+----------+--------------+ PFV      Full                                                        +---------+---------------+---------+-----------+----------+--------------+ POP      Full           Yes      Yes                                 +---------+---------------+---------+-----------+----------+--------------+ PTV      Full                                                         +---------+---------------+---------+-----------+----------+--------------+ PERO     Full                                                        +---------+---------------+---------+-----------+----------+--------------+     Summary: RIGHT: - There is no evidence of deep vein thrombosis in the lower extremity. However, portions of this examination were limited- see technologist comments above.  - No cystic structure found in the popliteal fossa.  LEFT: - There is no evidence of deep vein thrombosis in the lower extremity. However, portions of this examination were limited- see technologist comments above.  - No cystic structure found in the popliteal fossa. - Ultrasound characteristics of enlarged lymph nodes noted in the groin.  *See table(s) above for measurements and observations. Electronically signed by Deitra Mayo MD on 10/07/2021 at 6:01:03 PM.    Final    CT ABDOMEN PELVIS WO CONTRAST  Result Date: 09/26/2021 CLINICAL DATA:  Abdominal pain, nausea, vomiting, history colon cancer EXAM: CT ABDOMEN AND PELVIS WITHOUT CONTRAST TECHNIQUE: Multidetector CT imaging of the abdomen and pelvis was performed following the standard protocol without IV contrast. RADIATION DOSE REDUCTION: This exam was performed according to the departmental dose-optimization program which includes automated exposure control, adjustment of the mA and/or kV according to patient size and/or use of iterative reconstruction technique. COMPARISON:  09/11/2021 FINDINGS: Lower chest: Bibasilar pleural effusions and atelectasis greater on RIGHT, increased from prior exam. Hepatobiliary: No focal hepatic abnormalities. Gallbladder unremarkable. Pancreas: Normal appearance Spleen: Enlarged, 17.5 x 5.5 x 13.7 cm (volume = 690 cm^3) Adrenals/Urinary Tract: Adrenal glands, kidneys, and  ureters normal appearance. Bladder decompressed. Stomach/Bowel: Appendix not visualized. Stomach and bowel loops grossly unremarkable  Vascular/Lymphatic: Atherosclerotic calcifications aorta. Aorta normal caliber. Extensive adenopathy including retrocrural, gastrosplenic ligament, periportal, retroperitoneal, inguinal, BILATERAL pelvic. Observe nodes have generally mildly increased in sizes versus previous exam. Reproductive: Unremarkable prostate gland and seminal vesicles Other: Scattered free fluid. Extensive subcutaneous edema. No free air. No hernia. Musculoskeletal: Small sclerotic lesion RIGHT sacrum unchanged. Degenerative disc disease changes thoracolumbar spine. IMPRESSION: Extensive adenopathy as above, with many of the nodes slightly increased in sizes versus previous exam with associated splenomegaly. Findings favor lymphoma, extensive metastatic disease not completely excluded. Bibasilar pleural effusions and atelectasis greater on RIGHT, increased from prior exam. Scattered ascites and diffuse subcutaneous edema. Aortic Atherosclerosis (ICD10-I70.0). Electronically Signed   By: Lavonia Dana M.D.   On: 09/18/2021 14:08    Medications: I have reviewed the patient's current medications.  Assessment/Plan:  61 year old with:  1.  Diffuse adenopathy consistent with lymphoproliferative disorder without pathological confirmation.  CT scan obtained on 09/26/2021 was personally reviewed and showed splenomegaly in addition to his lymphadenopathy that has slightly progressed since last week.  The differential diagnosis was reviewed again with high-grade lymphoma likely the etiology without metastatic carcinoma to be less likely.  Obtaining excisional biopsy still mandatory in this particular setting.  I appreciate input from general surgery and hopefully biopsy can be completed in the near future.  Appropriate treatment for his condition cannot take place without the appropriate biopsy.  2.  Acute renal failure: Likely related to his malignancy with possibly tumor lysis in addition to dehydration.  I recommend nephrology consultation  and consideration for rasburicase for his elevated uric acid.  He might require temporary dialysis till final diagnosis is established and treatment has commenced.  3.  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  Aggressive measures are warranted however as his condition could be treatable.  4.  Follow up: We will continue to follow his progress during his hospitalization.  35  minutes were dedicated to this visit.  50% of time was face-to-face.  The time was spent on reviewing laboratory data, imaging studies, discussing differential diagnosis and answering questions regarding future plan.    LOS: 1 day   Zola Button 09/21/2021, 7:32 AM

## 2021-09-21 NOTE — Progress Notes (Signed)
Ordered and took ted hose  into room for patient, patient very restless and sitting on side of bed, will apply Ted hose  and Scds when patient more settled. Family did not want me to put on yet.  Porshe Fleagle, Tivis Ringer, RN

## 2021-09-21 NOTE — Progress Notes (Addendum)
    OVERNIGHT PROGRESS REPORT  Notified by RN for repeat potassium level. Continued Nausea unimproved with Zofran. Compazine ordered.  Repeat of treatment for Hyperkalemia as prior. Albumin from earlier orders, infusing.  Minimal urine output.  Bladder scanner shows +/- 25 mLs.  Update 0610 Hrs: AM labs pending post treatment for hyperkalemia.  Gershon Cull MSNA MSN ACNPC-AG Acute Care Nurse Practitioner [place]

## 2021-09-21 NOTE — Progress Notes (Signed)
[facility] Progress Note Patient Name: Thomas Waller DOB: Oct 27, 1960 MRN: [id number]   Date of Service  09/21/2021  HPI/Events of Note  On arrival in the room via camera, patient with a Code Blue, reportedly a recently admitted dialysis patient pending dialysis, CPR in progress.  eICU Interventions  Epinephrine 1 mg ordered, board ordered placed under the patient, given possibility of metabolic acidosis 1 amp of Bicarb ordered, code team  (with physician) arrived and took over the resuscitation.        Frederik Pear 09/21/2021, 9:38 PM

## 2021-09-21 NOTE — Evaluation (Signed)
Physical Therapy Evaluation Patient Details Name: Thomas Waller MRN: [id number] DOB: [birth year] Today's Date: 09/21/2021  History of Present Illness  Thomas Waller is a 61 y.o. male admitted 09/27/2021 with ongoing complaints of nausea and vomiting over the past several days with abdominal pain/constipation Dx with with AKI and worsening creatinine compared to last week. PMH includes essential hypertension, obesity, gout, rheumatoid arthritis on Enbrel. Of note: Recently hospitalized Aug 28-09/16/21 with complaints of fatigue nausea abdominal pain.  He was found to have diffuse lymphadenopathy, hypercalcemic, AKI, and  underwent biopsy of one of his inguinal lymph nodes (per medical oncology they are not sufficient to initiate treatment). Pt will need an excisional biopsy of one of his lymph nodes.   Clinical Impression  Pt is unreliable historian, lethargic, and times of restlessness during session. Initially expressing frustration about NPO status despite education from [employer] staff and therapy staff. Pt was able to perform sit to stand transfer and limited stepping to sink with use of RW (per pt request) reports no AD use at baseline. Able to stand at sink for prolonged period to perform grooming tasks, but required heavy B UE support leaning with elbows on sink and chair behind pt for safety. Pt will benefit from continued skilled PT to increase independence and maximize safety with mobility. Dependent on what level of assist can be provided post-acute stay he will need at least HHPT (will see if cognition improves) he will need 24 hour supervision and assist with mobility for safety due to current cognitive and functional mobility impairments.         Recommendations for follow up therapy are one component of a multi-disciplinary discharge planning process, led by the attending physician.  Recommendations may be updated based on patient status, additional functional criteria and insurance  authorization.  Follow Up Recommendations Home health PT      Assistance Recommended at Discharge Frequent or constant Supervision/Assistance  Patient can return home with the following  A little help with walking and/or transfers;A little help with bathing/dressing/bathroom;Assistance with cooking/housework;Direct supervision/assist for medications management;Direct supervision/assist for financial management;Help with stairs or ramp for entrance    Equipment Recommendations Rolling walker (2 wheels)  Recommendations for Other Services       Functional Status Assessment Patient has had a recent decline in their functional status and demonstrates the ability to make significant improvements in function in a reasonable and predictable amount of time.     Precautions / Restrictions Precautions Precautions: Fall Restrictions Weight Bearing Restrictions: No      Mobility  Bed Mobility               General bed mobility comments: Pt received in recliner upon entry, returned to recliner at end of session    Transfers Overall transfer level: Needs assistance Equipment used: Rolling walker (2 wheels) Transfers: Sit to/from Stand Sit to Stand: Mod assist, Min assist           General transfer comment: +2 provided for safety but not needed for power up once pt able to scoot hips to edge of seat. Pt able to scoot hips to edge of chair, cues for hand placement and use of B UEs for power up to stand. Pt requesting use of RW though he does not use AD at baseline.    Ambulation/Gait Ambulation/Gait assistance: Min guard Gait Distance (Feet): 2 Feet Assistive device: Rolling walker (2 wheels) Gait Pattern/deviations: Step-to pattern, Decreased stride length, Trunk flexed, Wide base of support Gait velocity: decreased  General Gait Details: Pt able to ambulate 70f forward and take multiple side steps to get in front of sink to brush teeth. trunk flexed and leaving RW and  reaching for sink to step over. B elbows leaning on sink during standing.  Required CGA and chair behind while standing at sink for safety. on 3L with O2 sat 95%. O2 low of 87-88% with reliable reading on room air during mobility. Placed on 2L with O2 sat 92%. HR MAX observed 106bpm.  Stairs            Wheelchair Mobility    Modified Rankin (Stroke Patients Only)       Balance Overall balance assessment: Needs assistance Sitting-balance support: Feet supported Sitting balance-Leahy Scale: Good     Standing balance support: Bilateral upper extremity supported, Reliant on assistive device for balance Standing balance-Leahy Scale: Poor                               Pertinent Vitals/Pain Pain Assessment Pain Assessment: Faces Faces Pain Scale: Hurts even more Pain Location: abdomen and back Pain Descriptors / Indicators: Grimacing, Discomfort Pain Intervention(s): Limited activity within patient's tolerance, Monitored during session    Home Living Family/patient expects to be discharged to:: Private residence Living Arrangements: Spouse/significant other Available Help at Discharge: Family Type of Home: House Home Access: Stairs to enter   ETechnical brewerof Steps: 2   Home Layout: One level Home Equipment: None Additional Comments: no family present to confirm home set up or PLOF. Pt unreliable historian often providing contradictory information    Prior Function Prior Level of Function : Independent/Modified Independent             Mobility Comments: reports no AD or supplemental O2 use at baseline. Denies history of falls ADLs Comments: reports independent     Hand Dominance   Dominant Hand: Right    Extremity/Trunk Assessment   Upper Extremity Assessment Upper Extremity Assessment: Generalized weakness    Lower Extremity Assessment Lower Extremity Assessment: Defer to PT evaluation    Cervical / Trunk Assessment Cervical /  Trunk Assessment: Normal  Communication   Communication: No difficulties  Cognition Arousal/Alertness: Lethargic Behavior During Therapy: Flat affect, Agitated Overall Cognitive Status: Impaired/Different from baseline Area of Impairment: Attention, Memory, Following commands, Problem solving, Awareness, Safety/judgement                   Current Attention Level: Sustained Memory: Decreased short-term memory Following Commands: Follows one step commands consistently, Follows one step commands with increased time Safety/Judgement: Decreased awareness of safety Awareness: Emergent Problem Solving: Slow processing, Difficulty sequencing, Requires verbal cues General Comments: intermttently agitated/frustrated with therapist asking questions about PLOF/home environment. lethargic with eyes closed multiple times while seated in recliner. Inconsistent answers to home environment questions, first stating that he has no stairs to enter and then stating 2 steps to enter home. Reports he is typically independent at baseline.        General Comments      Exercises     Assessment/Plan    PT Assessment Patient needs continued PT services  PT Problem List Decreased strength;Decreased activity tolerance;Decreased balance;Decreased mobility;Decreased knowledge of use of DME;Decreased safety awareness       PT Treatment Interventions DME instruction;Gait training;Stair training;Functional mobility training;Therapeutic activities;Therapeutic exercise;Balance training;Patient/family education    PT Goals (Current goals can be found in the Care Plan section)  Acute Rehab PT Goals  Patient Stated Goal: none stated at this time. PT Goal Formulation: With patient Time For Goal Achievement: 10/05/21 Potential to Achieve Goals: Good    Frequency Min 3X/week     Co-evaluation PT/OT/SLP Co-Evaluation/Treatment: Yes Reason for Co-Treatment: Necessary to address cognition/behavior during  functional activity;For patient/therapist safety;To address functional/ADL transfers PT goals addressed during session: Mobility/safety with mobility;Balance;Strengthening/ROM;Proper use of DME OT goals addressed during session: ADL's and self-care;Strengthening/ROM;Proper use of Adaptive equipment and DME       AM-PAC PT "6 Clicks" Mobility  Outcome Measure Help needed turning from your back to your side while in a flat bed without using bedrails?: A Little Help needed moving from lying on your back to sitting on the side of a flat bed without using bedrails?: A Lot Help needed moving to and from a bed to a chair (including a wheelchair)?: A Little Help needed standing up from a chair using your arms (e.g., wheelchair or bedside chair)?: A Little Help needed to walk in hospital room?: A Lot Help needed climbing 3-5 steps with a railing? : A Lot 6 Click Score: 15    End of Session Equipment Utilized During Treatment: Gait belt Activity Tolerance: Patient tolerated treatment well;Patient limited by lethargy Patient left: in chair;with call bell/phone within reach;with chair alarm set (x-ray staff entering room at end of session) Nurse Communication: Mobility status PT Visit Diagnosis: Unsteadiness on feet (R26.81);Muscle weakness (generalized) (M62.81)    Time: 6861-6837 PT Time Calculation (min) (ACUTE ONLY): 23 min   Charges:   PT Evaluation $PT Eval Low Complexity: 1 Low PT Treatments $Therapeutic Activity: 8-22 mins       Festus Barren PT, DPT  Acute Rehabilitation Services  Office [phone]  09/21/2021, 1:14 PM

## 2021-09-21 NOTE — Progress Notes (Signed)
PROGRESS NOTE Thomas Waller  YWV:[id number] DOB: 1960-01-27 DOA: 10/11/2021 PCP: Unk Pinto, MD   Brief Narrative/Hospital Course: 61 y.o.m w/ medical history of HTN, obesity, gout, Rheumatoid arthritis on Enbrel recently hospitalized on August 28 with complaints of fatigue nausea abdominal pain w/ diffuse lymphadenopathy, hypercalcemia and AKI > Undergo biopsy of one of his inguinal lymph nodes was performed and discharged home on 09/16/21. He is admitted with nausea and vomiting,abdominal pain,constipation and for need of an excisional biopsy of one of his lymph nodes as prior result insufficient for treatment AND found to have AKI low sodium levels, leukocytosis,hyperkalemia.General surgery consulted for excisional biopsy as recommended by [facility]    Subjective: Seen and examined. Anxious mildly agitated nursing reported that patient ordered leaving his medical advice. Came in because with the patient's at the bedside, nephrology was in the room. He has been getting up without notifying nursing staff and transferred to bedside chair. Legs are edematous, appears uncomfortable. BP has been soft potassium downtrending after Lokelma Foley in place with minimal urine output He has been requesting to leave AGAINST MEDICAL ADVICE,discussed for potential transfer to [facility] and is agreeable.  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  Likely multifactorial-has fluid overload.  Nephrology consulted, Foley catheter in place with minimal urine output.  Uric acid is not severely elevated so TLS unlikely per nephrology.  Platelets are low may have TMA related to underlying cancer.  Oncology is following closely.  Suspecting that he will continue to decline and will need HD soon, recommended to transfer to [facility] order placed, after  discussing with Dr. Alen Blew from oncology.  Monitor intake output Daily weight.  Recent echo on last admission with normal LV EF, lower extremity duplex-no DVT 9/5, tho some limitation.  Chest x-ray bibasilar atelectasis, trace right pleural effusion.Nephrology starting IV Lasix high '120mg'$  q8hr -dose along with iv albumin.  Hyperkalemia: Potassium downtrending, nephro following continue Lokelma monitor Recent Labs  Lab 09/15/21 2013 10/07/2021 1147 10/03/2021 1550 09/19/2021 2225 09/21/21 0610  K 5.2* 6.1* 5.9* 6.3* 5.5*    Diffuse adenopathy consistent with lymphoproliferative disorder without pathology confirmation: Dr. Alen Blew following, CT 9/5 with splenomegaly lymphadenopathy that is slightly progressed.  Surgery following for excisional biopsy once safe to proceed.  Per hematology oncology cannot do chemotherapy before doing confirmation with appropriate biopsy.  Okay to transfer to [facility].  Hyponatremia with fluid overload:Monitor, plan per nephrology Recent Labs  Lab 09/15/21 0432 09/15/21 2013 09/26/2021 1147 10/14/2021 2225 09/21/21 0610  NA 131* 132* 126* 130* 128*   Nausea and vomiting unclear etiology could be AKI-uremia related.  Monitor continue symptomatic treatment Hypercalcemia of malignancy-calcium stable.Monitor. Class II obesity with BMI 35.7:Weight loss and outpatient follow-up once acute issue resolved History of RA on Enbrel currently on hold. Essential hypertension BP soft losartan on hold. History of gout on allopurinol. Thrombocytopenia/leukocytosis:Likely due to patient's lymphoproliferative disease related.  Monitor Transaminitis/abnormal LFT unclear etiology no liver lesion on the CT.  Monitor Coagulopathy:INR at 1.7 with lymphoma-monitor.  DVT prophylaxis: Place and maintain sequential compression device Start: 09/26/2021 1553 Place TED hose Start: 09/25/2021 1500 Code Status:   Code Status: Full Code Family Communication: plan of care discussed with  patient/wife on phone updated-she is on the way to see him  Patient status is: inpatient because of aki, Level of care: Progressive  Dispo: The patient is from: home  Anticipated disposition: To [facility] once bed available.  Followed up with the nursing staff patient was transferred to [facility], signed out to Dr. Waldron Labs   Mobility Assessment (last 72 hours)     Mobility Assessment     Row Name 09/21/21 0700 09/30/2021 2000 09/22/2021 1800       Does patient have an order for bedrest or is patient medically unstable No - Continue assessment No - Continue assessment No - Continue assessment     What is the highest level of mobility based on the progressive mobility assessment? Level 1 (Bedfast) - Unable to balance while sitting on edge of bed Level 1 (Bedfast) - Unable to balance while sitting on edge of bed Level 1 (Bedfast) - Unable to balance while sitting on edge of bed               Objective: Vitals last 24 hrs: Vitals:   09/19/2021 2204 09/21/21 0022 09/21/21 0410 09/21/21 0800  BP: 104/60 101/67 112/74 90/60  Pulse: (!) 101 100 (!) 101 (!) 106  Resp: '18 19 20 19  '$ Temp: 98 F (36.7 C) 97.7 F (36.5 C) 98 F (36.7 C) 98.2 F (36.8 C)  TempSrc: Oral Oral Oral Oral  SpO2: 98% 98% 94% 92%   Weight change:   Physical Examination: General exam: alert awake, obese older than stated age, weak appearing. HEENT:Oral mucosa moist, Ear/Nose WNL grossly, dentition normal. Respiratory system: bilaterally diminished BS, no use of accessory muscle Cardiovascular system: S1 & S2 +, No JVD. Gastrointestinal system: Abdomen soft,NT,ND, BS+ Nervous System:Alert, awake, moving extremities and grossly nonfocal Extremities: LE edema ++,distal peripheral pulses palpable.  Skin: No rashes,no icterus. MSK: Normal muscle bulk,tone, power  Medications reviewed:  Scheduled Meds:  sodium zirconium cyclosilicate  10 g Oral L4T   Continuous Infusions:   sodium chloride 10 mL/hr at 09/21/21 0115   furosemide        Diet Order             Diet NPO time specified Except for: Sips with Meds, Ice Chips  Diet effective now                   Intake/Output Summary (Last 24 hours) at 09/21/2021 1055 Last data filed at 09/21/2021 0410 Gross per 24 hour  Intake 2117.4 ml  Output 80 ml  Net 2037.4 ml   Net IO Since Admission: 2,037.4 mL [09/21/21 1055]  Wt Readings from Last 3 Encounters:  09/16/21 112.9 kg  09/15/21 114.3 kg  09/12/21 114.5 kg     Unresulted Labs (From admission, onward)    None     Data Reviewed: I have personally reviewed following labs and imaging studies CBC: Recent Labs  Lab 09/15/21 0432 09/15/21 2013 10/15/2021 1147 09/21/21 0610  WBC 16.2* 15.9* 22.8* 18.9*  HGB 15.9 16.3 16.2 15.2  HCT 47.7 46.4 45.0 42.7  MCV 90.0 86.4 83.3 83.4  PLT 164 148* 67* 53*   Basic Metabolic Panel: Recent Labs  Lab 09/15/21 0432 09/15/21 2013 10/06/2021 1147 09/26/2021 1550 10/06/2021 2225 09/21/21 0610  NA 131* 132* 126*  --  130* 128*  K 4.9 5.2* 6.1* 5.9* 6.3* 5.5*  CL 104 99 97*  --  100 97*  CO2 22 22 18*  --  18* 19*  GLUCOSE 90 95 85  --  74 79  BUN 51* 51* 105*  --  104* 119*  CREATININE 1.91* 2.07* 3.86*  --  4.27* 4.76*  CALCIUM 11.5* 12.0* 11.2*  --  10.9* 10.8*   GFR: Estimated Creatinine Clearance: 20.8 mL/min (A) (by C-G formula based on SCr of 4.76 mg/dL (H)). Liver Function Tests: Recent Labs  Lab 09/15/21 0432 09/15/21 2013 10/05/2021 1147 09/21/21 0610  AST 58* 70* 62* 52*  ALT 65* 74* 68* 65*  ALKPHOS 234* 276* 292* 289*  BILITOT 1.1 1.2 2.9* 2.6*  PROT 5.5* 5.4* 5.2* 5.3*  ALBUMIN 2.7* 2.8* 2.6* 2.7*   Recent Labs  Lab 09/15/21 2013 09/25/2021 1147  LIPASE 28 30   No results for input(s): "AMMONIA" in the last 168 hours. Coagulation Profile: Recent Labs  Lab 09/21/21 0610  INR 1.7*   BNP (last 3 results) No results for input(s): "PROBNP" in the last 8760 hours. HbA1C: No  results for input(s): "HGBA1C" in the last 72 hours. CBG: Recent Labs  Lab 09/18/2021 2359  GLUCAP 87   Lipid Profile: No results for input(s): "CHOL", "HDL", "LDLCALC", "TRIG", "CHOLHDL", "LDLDIRECT" in the last 72 hours. Thyroid Function Tests: No results for input(s): "TSH", "T4TOTAL", "FREET4", "T3FREE", "THYROIDAB" in the last 72 hours. Sepsis Labs: No results for input(s): "PROCALCITON", "LATICACIDVEN" in the last 168 hours.  Recent Results (from the past 240 hour(s))  Resp Panel by RT-PCR (Flu A&B, Covid) Anterior Nasal Swab     Status: None   Collection Time: 09/11/21  7:23 PM   Specimen: Anterior Nasal Swab  Result Value Ref Range Status   SARS Coronavirus 2 by RT PCR NEGATIVE NEGATIVE Final    Comment: (NOTE) SARS-CoV-2 target nucleic acids are NOT DETECTED.  The SARS-CoV-2 RNA is generally detectable in upper respiratory specimens during the acute phase of infection. The lowest concentration of SARS-CoV-2 viral copies this assay can detect is 138 copies/mL. A negative result does not preclude SARS-Cov-2 infection and should not be used as the sole basis for treatment or other patient management decisions. A negative result may occur with  improper specimen collection/handling, submission of specimen other than nasopharyngeal swab, presence of viral mutation(s) within the areas targeted by this assay, and inadequate number of viral copies(<138 copies/mL). A negative result must be combined with clinical observations, patient history, and epidemiological information. The expected result is Negative.  Fact Sheet for Patients:  EntrepreneurPulse.com.au  Fact Sheet for Healthcare Providers:  IncredibleEmployment.be  This test is no t yet approved or cleared by the [place] FDA and  has been authorized for detection and/or diagnosis of SARS-CoV-2 by FDA under an Emergency Use Authorization (EUA). This EUA will remain  in effect  (meaning this test can be used) for the duration of the COVID-19 declaration under Section 564(b)(1) of the Act, 21 U.S.C.section 360bbb-3(b)(1), unless the authorization is terminated  or revoked sooner.       Influenza A by PCR NEGATIVE NEGATIVE Final   Influenza B by PCR NEGATIVE NEGATIVE Final    Comment: (NOTE) The Xpert Xpress SARS-CoV-2/FLU/RSV plus assay is intended as an aid in the diagnosis of influenza from Nasopharyngeal swab specimens and should not be used as a sole basis for treatment. Nasal washings and aspirates are unacceptable for Xpert Xpress SARS-CoV-2/FLU/RSV testing.  Fact Sheet for Patients: EntrepreneurPulse.com.au  Fact Sheet for Healthcare Providers: IncredibleEmployment.be  This test is not yet approved or cleared by the [place] FDA and has been authorized for detection and/or diagnosis of SARS-CoV-2 by FDA under an Emergency Use Authorization (EUA). This EUA will remain in effect (meaning this test can be used) for the duration of the COVID-19  declaration under Section 564(b)(1) of the Act, 21 U.S.C. section 360bbb-3(b)(1), unless the authorization is terminated or revoked.  Performed at [facility], [address] [place], [place] [address]     Antimicrobials: Anti-infectives (From admission, onward)    None      Culture/Microbiology No results found for: "SDES", "SPECREQUEST", "CULT", "REPTSTATUS"  Other culture-see note  Radiology Studies: DG CHEST PORT 1 VIEW  Result Date: 09/21/2021 CLINICAL DATA:  Acute kidney injury EXAM: PORTABLE CHEST 1 VIEW COMPARISON:  09/11/2021 FINDINGS: Bibasilar atelectasis versus scarring. Trace right pleural effusion. No focal consolidation. No pneumothorax. Heart and mediastinal contours are unremarkable. No acute osseous abnormality. IMPRESSION: 1. Bibasilar atelectasis versus scarring. Trace right pleural effusion. Electronically Signed   By:  Kathreen Devoid M.D.   On: 09/21/2021 10:04   VAS [place] LOWER EXTREMITY VENOUS (DVT)  Result Date: 10/15/2021  Lower Venous DVT Study Patient Name:  NUMA SCHROETER  Date of Exam:   10/02/2021 Medical Rec #: [id number]        Accession #:    [id number] Date of Birth: 12-06-1960        Patient Gender: M Patient Age:   5 years Exam Location:  [facility] Procedure:      VAS [place] LOWER EXTREMITY VENOUS (DVT) Referring Phys: Bonnielee Haff --------------------------------------------------------------------------------  Indications: Edema.  Risk Factors: Cancer. Limitations: Poor ultrasound/tissue interface and body habitus. Comparison Study: No prior studies. Performing Technologist: Oliver Hum RVT  Examination Guidelines: A complete evaluation includes B-mode imaging, spectral Doppler, color Doppler, and power Doppler as needed of all accessible portions of each vessel. Bilateral testing is considered an integral part of a complete examination. Limited examinations for reoccurring indications may be performed as noted. The reflux portion of the exam is performed with the patient in reverse Trendelenburg.  +---------+---------------+---------+-----------+----------+--------------+ RIGHT    CompressibilityPhasicitySpontaneityPropertiesThrombus Aging +---------+---------------+---------+-----------+----------+--------------+ CFV      Full           Yes      Yes                                 +---------+---------------+---------+-----------+----------+--------------+ SFJ      Full                                                        +---------+---------------+---------+-----------+----------+--------------+ FV Prox  Full                                                        +---------+---------------+---------+-----------+----------+--------------+ FV Mid   Full                                                         +---------+---------------+---------+-----------+----------+--------------+ FV Distal               Yes      Yes                                 +---------+---------------+---------+-----------+----------+--------------+  PFV      Full                                                        +---------+---------------+---------+-----------+----------+--------------+ POP      Full           Yes      Yes                                 +---------+---------------+---------+-----------+----------+--------------+ PTV      Full                                                        +---------+---------------+---------+-----------+----------+--------------+ PERO     Full                                                        +---------+---------------+---------+-----------+----------+--------------+   +---------+---------------+---------+-----------+----------+--------------+ LEFT     CompressibilityPhasicitySpontaneityPropertiesThrombus Aging +---------+---------------+---------+-----------+----------+--------------+ CFV      Full           Yes      Yes                                 +---------+---------------+---------+-----------+----------+--------------+ SFJ      Full                                                        +---------+---------------+---------+-----------+----------+--------------+ FV Prox  Full                                                        +---------+---------------+---------+-----------+----------+--------------+ FV Mid   Full                                                        +---------+---------------+---------+-----------+----------+--------------+ FV Distal               Yes      Yes                                 +---------+---------------+---------+-----------+----------+--------------+ PFV      Full                                                         +---------+---------------+---------+-----------+----------+--------------+  POP      Full           Yes      Yes                                 +---------+---------------+---------+-----------+----------+--------------+ PTV      Full                                                        +---------+---------------+---------+-----------+----------+--------------+ PERO     Full                                                        +---------+---------------+---------+-----------+----------+--------------+     Summary: RIGHT: - There is no evidence of deep vein thrombosis in the lower extremity. However, portions of this examination were limited- see technologist comments above.  - No cystic structure found in the popliteal fossa.  LEFT: - There is no evidence of deep vein thrombosis in the lower extremity. However, portions of this examination were limited- see technologist comments above.  - No cystic structure found in the popliteal fossa. - Ultrasound characteristics of enlarged lymph nodes noted in the groin.  *See table(s) above for measurements and observations. Electronically signed by Deitra Mayo MD on 09/24/2021 at 6:01:03 PM.    Final    CT ABDOMEN PELVIS WO CONTRAST  Result Date: 09/27/2021 CLINICAL DATA:  Abdominal pain, nausea, vomiting, history colon cancer EXAM: CT ABDOMEN AND PELVIS WITHOUT CONTRAST TECHNIQUE: Multidetector CT imaging of the abdomen and pelvis was performed following the standard protocol without IV contrast. RADIATION DOSE REDUCTION: This exam was performed according to the departmental dose-optimization program which includes automated exposure control, adjustment of the mA and/or kV according to patient size and/or use of iterative reconstruction technique. COMPARISON:  09/11/2021 FINDINGS: Lower chest: Bibasilar pleural effusions and atelectasis greater on RIGHT, increased from prior exam. Hepatobiliary: No focal hepatic abnormalities. Gallbladder  unremarkable. Pancreas: Normal appearance Spleen: Enlarged, 17.5 x 5.5 x 13.7 cm (volume = 690 cm^3) Adrenals/Urinary Tract: Adrenal glands, kidneys, and ureters normal appearance. Bladder decompressed. Stomach/Bowel: Appendix not visualized. Stomach and bowel loops grossly unremarkable Vascular/Lymphatic: Atherosclerotic calcifications aorta. Aorta normal caliber. Extensive adenopathy including retrocrural, gastrosplenic ligament, periportal, retroperitoneal, inguinal, BILATERAL pelvic. Observe nodes have generally mildly increased in sizes versus previous exam. Reproductive: Unremarkable prostate gland and seminal vesicles Other: Scattered free fluid. Extensive subcutaneous edema. No free air. No hernia. Musculoskeletal: Small sclerotic lesion RIGHT sacrum unchanged. Degenerative disc disease changes thoracolumbar spine. IMPRESSION: Extensive adenopathy as above, with many of the nodes slightly increased in sizes versus previous exam with associated splenomegaly. Findings favor lymphoma, extensive metastatic disease not completely excluded. Bibasilar pleural effusions and atelectasis greater on RIGHT, increased from prior exam. Scattered ascites and diffuse subcutaneous edema. Aortic Atherosclerosis (ICD10-I70.0). Electronically Signed   By: Lavonia Dana M.D.   On: 09/29/2021 14:08     LOS: 1 day   Antonieta Pert, MD [facility]  09/21/2021, 10:55 AM

## 2021-09-21 NOTE — Progress Notes (Signed)
Code blue was called after the patient was noted to be in asystole.  After ACLS protocol, he had ROSC.  Called and updated his wife Thomas Waller via phone.  She wants to visit.  Spoke with the charge nurse on 37M.  She will call the patient's wife and instruct her how to get to the patient's room.

## 2021-09-21 NOTE — Anesthesia Procedure Notes (Signed)
Procedure Name: Intubation Date/Time: 09/21/2021 9:41 PM  Performed by: Reece Agar, CRNAPre-anesthesia Checklist: Patient identified, Emergency Drugs available, Suction available and Patient being monitored Patient Re-evaluated:Patient Re-evaluated prior to induction Oxygen Delivery Method: Ambu bag Preoxygenation: Pre-oxygenation with 100% oxygen Induction Type: IV induction, Rapid sequence and Cricoid Pressure applied Ventilation: Mask ventilation without difficulty Laryngoscope Size: Glidescope and 3 Grade View: Grade I Tube type: Subglottic suction tube Tube size: 7.5 mm Number of attempts: 1 Airway Equipment and Method: Stylet and Video-laryngoscopy Placement Confirmation: ETT inserted through vocal cords under direct vision, breath sounds checked- equal and bilateral and CO2 detector Secured at: 23 cm Tube secured with: Tape Dental Injury: Teeth and Oropharynx as per pre-operative assessment  Comments: CRNA arrived to Code, CPR in progress. Pt suctioned and successfully intubated with S3 Glidescope blade.

## 2021-09-21 NOTE — Hospital Course (Signed)
61 y.o.m w/ medical history of HTN, obesity, gout, Rheumatoid arthritis on Enbrel recently hospitalized on August 28 with complaints of fatigue nausea abdominal pain w/ diffuse lymphadenopathy, hypercalcemia and AKI > Undergo biopsy of one of his inguinal lymph nodes was performed and discharged home on 09/16/21. He is admitted with nausea and vomiting,abdominal pain,constipation and for need of an excisional biopsy of one of his lymph nodes as prior result insufficient for treatment AND found to have AKI low sodium levels, leukocytosis,hyperkalemia.General surgery consulted for excisional biopsy as recommended by [facility]

## 2021-09-22 ENCOUNTER — Inpatient Hospital Stay (HOSPITAL_COMMUNITY): Payer: BC Managed Care – PPO

## 2021-09-22 ENCOUNTER — Inpatient Hospital Stay: Payer: BC Managed Care – PPO | Admitting: Internal Medicine

## 2021-09-22 ENCOUNTER — Other Ambulatory Visit: Payer: Self-pay

## 2021-09-22 DIAGNOSIS — N179 Acute kidney failure, unspecified: Secondary | ICD-10-CM | POA: Diagnosis not present

## 2021-09-22 DIAGNOSIS — I469 Cardiac arrest, cause unspecified: Secondary | ICD-10-CM

## 2021-09-22 LAB — COMPREHENSIVE METABOLIC PANEL
ALT: 214 U/L — ABNORMAL HIGH (ref 0–44)
AST: 264 U/L — ABNORMAL HIGH (ref 15–41)
Albumin: 2.9 g/dL — ABNORMAL LOW (ref 3.5–5.0)
Alkaline Phosphatase: 239 U/L — ABNORMAL HIGH (ref 38–126)
Anion gap: 19 — ABNORMAL HIGH (ref 5–15)
BUN: 132 mg/dL — ABNORMAL HIGH (ref 6–20)
CO2: 16 mmol/L — ABNORMAL LOW (ref 22–32)
Calcium: 9.7 mg/dL (ref 8.9–10.3)
Chloride: 99 mmol/L (ref 98–111)
Creatinine, Ser: 6.1 mg/dL — ABNORMAL HIGH (ref 0.61–1.24)
GFR, Estimated: 10 mL/min — ABNORMAL LOW (ref >60)
Glucose, Bld: 75 mg/dL (ref 70–99)
Potassium: 5.6 mmol/L — ABNORMAL HIGH (ref 3.5–5.1)
Sodium: 134 mmol/L — ABNORMAL LOW (ref 135–145)
Total Bilirubin: 2.5 mg/dL — ABNORMAL HIGH (ref 0.3–1.2)
Total Protein: 4.8 g/dL — ABNORMAL LOW (ref 6.5–8.1)

## 2021-09-22 LAB — RENAL FUNCTION PANEL
Albumin: 2.4 g/dL — ABNORMAL LOW (ref 3.5–5.0)
Anion gap: 21 — ABNORMAL HIGH (ref 5–15)
BUN: 141 mg/dL — ABNORMAL HIGH (ref 6–20)
CO2: 13 mmol/L — ABNORMAL LOW (ref 22–32)
Calcium: 10.5 mg/dL — ABNORMAL HIGH (ref 8.9–10.3)
Chloride: 93 mmol/L — ABNORMAL LOW (ref 98–111)
Creatinine, Ser: 6.51 mg/dL — ABNORMAL HIGH (ref 0.61–1.24)
GFR, Estimated: 9 mL/min — ABNORMAL LOW (ref >60)
Glucose, Bld: 87 mg/dL (ref 70–99)
Phosphorus: 7.5 mg/dL — ABNORMAL HIGH (ref 2.5–4.6)
Potassium: 5.9 mmol/L — ABNORMAL HIGH (ref 3.5–5.1)
Sodium: 127 mmol/L — ABNORMAL LOW (ref 135–145)

## 2021-09-22 LAB — POCT I-STAT EG7
Acid-base deficit: 8 mmol/L — ABNORMAL HIGH (ref 0.0–2.0)
Bicarbonate: 18.3 mmol/L — ABNORMAL LOW (ref 20.0–28.0)
Calcium, Ion: 1.32 mmol/L (ref 1.15–1.40)
HCT: 48 % (ref 39.0–52.0)
Hemoglobin: 16.3 g/dL (ref 13.0–17.0)
O2 Saturation: 85 %
Patient temperature: 36.7
Potassium: 5.8 mmol/L — ABNORMAL HIGH (ref 3.5–5.1)
Sodium: 128 mmol/L — ABNORMAL LOW (ref 135–145)
TCO2: 19 mmol/L — ABNORMAL LOW (ref 22–32)
pCO2, Ven: 37.6 mmHg — ABNORMAL LOW (ref 44–60)
pH, Ven: 7.292 (ref 7.25–7.43)
pO2, Ven: 54 mmHg — ABNORMAL HIGH (ref 32–45)

## 2021-09-22 LAB — TROPONIN I (HIGH SENSITIVITY)
Troponin I (High Sensitivity): 111 ng/L (ref <18)
Troponin I (High Sensitivity): 244 ng/L (ref <18)
Troponin I (High Sensitivity): 222 ng/L (ref <18)

## 2021-09-22 LAB — CBC
HCT: 43.5 % (ref 39.0–52.0)
HCT: 38.5 % — ABNORMAL LOW (ref 39.0–52.0)
Hemoglobin: 15.9 g/dL (ref 13.0–17.0)
Hemoglobin: 14.1 g/dL (ref 13.0–17.0)
MCH: 30.2 pg (ref 26.0–34.0)
MCH: 30.5 pg (ref 26.0–34.0)
MCHC: 36.6 g/dL — ABNORMAL HIGH (ref 30.0–36.0)
MCHC: 36.6 g/dL — ABNORMAL HIGH (ref 30.0–36.0)
MCV: 82.5 fL (ref 80.0–100.0)
MCV: 83.3 fL (ref 80.0–100.0)
Platelets: 37 10*3/uL — ABNORMAL LOW (ref 150–400)
Platelets: 26 10*3/uL — CL (ref 150–400)
RBC: 5.27 MIL/uL (ref 4.22–5.81)
RBC: 4.62 MIL/uL (ref 4.22–5.81)
RDW: 15.1 % (ref 11.5–15.5)
RDW: 15.5 % (ref 11.5–15.5)
WBC: 31.1 10*3/uL — ABNORMAL HIGH (ref 4.0–10.5)
WBC: 29.7 10*3/uL — ABNORMAL HIGH (ref 4.0–10.5)
nRBC: 3.1 % — ABNORMAL HIGH (ref 0.0–0.2)
nRBC: 6.3 % — ABNORMAL HIGH (ref 0.0–0.2)

## 2021-09-22 LAB — POCT I-STAT 7, (LYTES, BLD GAS, ICA,H+H)
Acid-base deficit: 10 mmol/L — ABNORMAL HIGH (ref 0.0–2.0)
Acid-base deficit: 9 mmol/L — ABNORMAL HIGH (ref 0.0–2.0)
Acid-base deficit: 4 mmol/L — ABNORMAL HIGH (ref 0.0–2.0)
Bicarbonate: 15.2 mmol/L — ABNORMAL LOW (ref 20.0–28.0)
Bicarbonate: 16 mmol/L — ABNORMAL LOW (ref 20.0–28.0)
Bicarbonate: 22 mmol/L (ref 20.0–28.0)
Calcium, Ion: 1.35 mmol/L (ref 1.15–1.40)
Calcium, Ion: 1.37 mmol/L (ref 1.15–1.40)
Calcium, Ion: 1.15 mmol/L (ref 1.15–1.40)
HCT: 46 % (ref 39.0–52.0)
HCT: 49 % (ref 39.0–52.0)
HCT: 43 % (ref 39.0–52.0)
Hemoglobin: 15.6 g/dL (ref 13.0–17.0)
Hemoglobin: 16.7 g/dL (ref 13.0–17.0)
Hemoglobin: 14.6 g/dL (ref 13.0–17.0)
O2 Saturation: 98 %
O2 Saturation: 98 %
O2 Saturation: 98 %
Patient temperature: 97.2
Patient temperature: 36.5
Patient temperature: 36.1
Potassium: 5.3 mmol/L — ABNORMAL HIGH (ref 3.5–5.1)
Potassium: 5.7 mmol/L — ABNORMAL HIGH (ref 3.5–5.1)
Potassium: 5.1 mmol/L (ref 3.5–5.1)
Sodium: 126 mmol/L — ABNORMAL LOW (ref 135–145)
Sodium: 127 mmol/L — ABNORMAL LOW (ref 135–145)
Sodium: 134 mmol/L — ABNORMAL LOW (ref 135–145)
TCO2: 16 mmol/L — ABNORMAL LOW (ref 22–32)
TCO2: 17 mmol/L — ABNORMAL LOW (ref 22–32)
TCO2: 23 mmol/L (ref 22–32)
pCO2 arterial: 29.9 mmHg — ABNORMAL LOW (ref 32–48)
pCO2 arterial: 30.3 mmHg — ABNORMAL LOW (ref 32–48)
pCO2 arterial: 39.3 mmHg (ref 32–48)
pH, Arterial: 7.309 — ABNORMAL LOW (ref 7.35–7.45)
pH, Arterial: 7.327 — ABNORMAL LOW (ref 7.35–7.45)
pH, Arterial: 7.351 (ref 7.35–7.45)
pO2, Arterial: 117 mmHg — ABNORMAL HIGH (ref 83–108)
pO2, Arterial: 109 mmHg — ABNORMAL HIGH (ref 83–108)
pO2, Arterial: 109 mmHg — ABNORMAL HIGH (ref 83–108)

## 2021-09-22 LAB — BLOOD GAS, ARTERIAL
Acid-base deficit: 8.4 mmol/L — ABNORMAL HIGH (ref 0.0–2.0)
Bicarbonate: 16.3 mmol/L — ABNORMAL LOW (ref 20.0–28.0)
O2 Saturation: 100 %
Patient temperature: 37
pCO2 arterial: 31 mmHg — ABNORMAL LOW (ref 32–48)
pH, Arterial: 7.33 — ABNORMAL LOW (ref 7.35–7.45)
pO2, Arterial: 198 mmHg — ABNORMAL HIGH (ref 83–108)

## 2021-09-22 LAB — LACTIC ACID, PLASMA
Lactic Acid, Venous: 6.6 mmol/L (ref 0.5–1.9)
Lactic Acid, Venous: 8.7 mmol/L (ref 0.5–1.9)
Lactic Acid, Venous: 8.9 mmol/L (ref 0.5–1.9)
Lactic Acid, Venous: >9 mmol/L (ref 0.5–1.9)
Lactic Acid, Venous: 9 mmol/L (ref 0.5–1.9)

## 2021-09-22 LAB — MAGNESIUM: Magnesium: 3.2 mg/dL — ABNORMAL HIGH (ref 1.7–2.4)

## 2021-09-22 LAB — GLUCOSE, CAPILLARY
Glucose-Capillary: 109 mg/dL — ABNORMAL HIGH (ref 70–99)
Glucose-Capillary: 82 mg/dL (ref 70–99)
Glucose-Capillary: 59 mg/dL — ABNORMAL LOW (ref 70–99)
Glucose-Capillary: 91 mg/dL (ref 70–99)
Glucose-Capillary: 83 mg/dL (ref 70–99)
Glucose-Capillary: 72 mg/dL (ref 70–99)
Glucose-Capillary: 94 mg/dL (ref 70–99)

## 2021-09-22 LAB — ABO/RH: ABO/RH(D): A POS

## 2021-09-22 LAB — URIC ACID: Uric Acid, Serum: 9.3 mg/dL — ABNORMAL HIGH (ref 3.7–8.6)

## 2021-09-22 LAB — PHOSPHORUS
Phosphorus: 6.7 mg/dL — ABNORMAL HIGH (ref 2.5–4.6)
Phosphorus: 6.8 mg/dL — ABNORMAL HIGH (ref 2.5–4.6)

## 2021-09-22 LAB — ECHOCARDIOGRAM LIMITED
Height: 70 in
S' Lateral: 2.2 cm
Weight: 4825.43 oz

## 2021-09-22 LAB — TYPE AND SCREEN
ABO/RH(D): A POS
Antibody Screen: NEGATIVE

## 2021-09-22 LAB — MRSA NEXT GEN BY PCR, NASAL: MRSA by PCR Next Gen: NOT DETECTED

## 2021-09-22 MED ORDER — PHENYLEPHRINE HCL-NACL 20-0.9 MG/250ML-% IV SOLN
0.0000 ug/min | INTRAVENOUS | Status: DC
  Administered 2021-09-22 (×2): 400 ug/min via INTRAVENOUS
  Administered 2021-09-22: 20 ug/min via INTRAVENOUS
  Filled 2021-09-22 (×3): qty 250

## 2021-09-22 MED ORDER — ARTIFICIAL TEARS OPHTHALMIC OINT
TOPICAL_OINTMENT | OPHTHALMIC | Status: DC | PRN
  Filled 2021-09-22: qty 3.5

## 2021-09-22 MED ORDER — SODIUM CHLORIDE 0.9 % FOR CRRT: INTRAVENOUS_CENTRAL | Status: DC | PRN

## 2021-09-22 MED ORDER — MIDAZOLAM HCL 2 MG/2ML IJ SOLN: 2.0000 mg | Freq: Once | INTRAMUSCULAR | Status: AC

## 2021-09-22 MED ORDER — PHENYLEPHRINE HCL-NACL 20-0.9 MG/250ML-% IV SOLN
INTRAVENOUS | Status: AC
  Filled 2021-09-22: qty 250

## 2021-09-22 MED ORDER — PRISMASOL BGK 0/2.5 32-2.5 MEQ/L EC SOLN
Status: DC
  Filled 2021-09-22 (×16): qty 5000

## 2021-09-22 MED ORDER — VECURONIUM BROMIDE 10 MG IV SOLR
10.0000 mg | Freq: Once | INTRAVENOUS | Status: AC
Start: 2021-09-22 — End: 2021-09-22
  Administered 2021-09-22: 10 mg via INTRAVENOUS
  Filled 2021-09-22: qty 10

## 2021-09-22 MED ORDER — VANCOMYCIN HCL 1250 MG/250ML IV SOLN
1250.0000 mg | INTRAVENOUS | Status: DC
Start: 2021-09-23 — End: 2021-09-23
  Filled 2021-09-22: qty 250

## 2021-09-22 MED ORDER — VECURONIUM BROMIDE 10 MG IV SOLR
10.0000 mg | Freq: Once | INTRAVENOUS | Status: AC
  Administered 2021-09-22: 10 mg via INTRAVENOUS
  Filled 2021-09-22: qty 10

## 2021-09-22 MED ORDER — PANTOPRAZOLE SODIUM 40 MG IV SOLR
40.0000 mg | Freq: Two times a day (BID) | INTRAVENOUS | Status: DC
  Administered 2021-09-22 (×2): 40 mg via INTRAVENOUS
  Filled 2021-09-22 (×2): qty 10

## 2021-09-22 MED ORDER — DEXTROSE 50 % IV SOLN
INTRAVENOUS | Status: AC
  Administered 2021-09-22: 50 mL via INTRAVENOUS
  Filled 2021-09-22: qty 50

## 2021-09-22 MED ORDER — FENTANYL 2500MCG IN NS 250ML (10MCG/ML) PREMIX INFUSION
50.0000 ug/h | INTRAVENOUS | Status: DC
  Administered 2021-09-22: 150 ug/h via INTRAVENOUS
  Administered 2021-09-22: 50 ug/h via INTRAVENOUS
  Filled 2021-09-22 (×2): qty 250

## 2021-09-22 MED ORDER — FENTANYL CITRATE PF 50 MCG/ML IJ SOSY
50.0000 ug | PREFILLED_SYRINGE | Freq: Once | INTRAMUSCULAR | Status: AC
  Administered 2021-09-22: 50 ug via INTRAVENOUS

## 2021-09-22 MED ORDER — ALBUMIN HUMAN 25 % IV SOLN
50.0000 g | Freq: Once | INTRAVENOUS | Status: AC
  Administered 2021-09-22: 50 g via INTRAVENOUS
  Filled 2021-09-22: qty 200

## 2021-09-22 MED ORDER — PRISMASOL BGK 0/2.5 32-2.5 MEQ/L EC SOLN
Status: DC
  Filled 2021-09-22 (×5): qty 5000

## 2021-09-22 MED ORDER — SODIUM CHLORIDE 0.9 % IV SOLN
3.0000 g | Freq: Three times a day (TID) | INTRAVENOUS | Status: DC
  Administered 2021-09-22 (×2): 3 g via INTRAVENOUS
  Filled 2021-09-22 (×2): qty 8

## 2021-09-22 MED ORDER — DEXTROSE 50 % IV SOLN: 1.0000 | Freq: Once | INTRAVENOUS | Status: AC

## 2021-09-22 MED ORDER — SODIUM BICARBONATE 8.4 % IV SOLN
INTRAVENOUS | Status: AC
  Filled 2021-09-22: qty 100

## 2021-09-22 MED ORDER — SODIUM BICARBONATE 8.4 % IV SOLN: 100.0000 meq | Freq: Once | INTRAVENOUS | Status: DC

## 2021-09-22 MED ORDER — SODIUM BICARBONATE 8.4 % IV SOLN
50.0000 meq | Freq: Once | INTRAVENOUS | Status: AC
  Administered 2021-09-22: 50 meq via INTRAVENOUS
  Filled 2021-09-22: qty 50

## 2021-09-22 MED ORDER — VITAL HIGH PROTEIN PO LIQD
1000.0000 mL | ORAL | Status: DC
  Administered 2021-09-22: 1000 mL

## 2021-09-22 MED ORDER — EPINEPHRINE HCL 5 MG/250ML IV SOLN IN NS: 0.5000 ug/min | INTRAVENOUS | Status: DC

## 2021-09-22 MED ORDER — MIDAZOLAM HCL 2 MG/2ML IJ SOLN
INTRAMUSCULAR | Status: AC
  Administered 2021-09-22: 2 mg via INTRAVENOUS
  Filled 2021-09-22: qty 2

## 2021-09-22 MED ORDER — ADULT MULTIVITAMIN W/MINERALS CH
1.0000 | ORAL_TABLET | Freq: Every day | ORAL | Status: DC
Start: 2021-09-22 — End: 2021-09-23
  Administered 2021-09-22 – 2021-09-23 (×2): 1
  Filled 2021-09-22 (×2): qty 1

## 2021-09-22 MED ORDER — FENTANYL BOLUS VIA INFUSION
50.0000 ug | INTRAVENOUS | Status: DC | PRN
  Administered 2021-09-22 (×3): 50 ug via INTRAVENOUS

## 2021-09-22 MED ORDER — LACTATED RINGERS IV BOLUS
500.0000 mL | Freq: Once | INTRAVENOUS | Status: AC
  Administered 2021-09-22: 500 mL via INTRAVENOUS

## 2021-09-22 MED ORDER — VITAMIN C 500 MG PO TABS
500.0000 mg | ORAL_TABLET | Freq: Two times a day (BID) | ORAL | Status: DC
  Administered 2021-09-22 – 2021-09-23 (×3): 500 mg
  Filled 2021-09-22 (×3): qty 1

## 2021-09-22 MED ORDER — STERILE WATER FOR INJECTION IV SOLN
INTRAVENOUS | Status: DC
  Filled 2021-09-22 (×15): qty 150

## 2021-09-22 MED ORDER — MIDAZOLAM HCL 2 MG/2ML IJ SOLN
2.0000 mg | INTRAMUSCULAR | Status: DC | PRN
  Administered 2021-09-22 (×2): 2 mg via INTRAVENOUS
  Filled 2021-09-22 (×2): qty 2

## 2021-09-22 MED ORDER — ALBUMIN HUMAN 25 % IV SOLN
50.0000 g | Freq: Once | INTRAVENOUS | Status: AC
Start: 2021-09-22 — End: 2021-09-22
  Administered 2021-09-22: 50 g via INTRAVENOUS
  Filled 2021-09-22: qty 200

## 2021-09-22 MED ORDER — DOCUSATE SODIUM 50 MG/5ML PO LIQD
100.0000 mg | Freq: Two times a day (BID) | ORAL | Status: DC
  Administered 2021-09-22 – 2021-09-23 (×3): 100 mg
  Filled 2021-09-22 (×3): qty 10

## 2021-09-22 MED ORDER — STERILE WATER FOR INJECTION IV SOLN
INTRAVENOUS | Status: DC
  Filled 2021-09-22: qty 1000
  Filled 2021-09-22: qty 150
  Filled 2021-09-22 (×2): qty 1000

## 2021-09-22 MED ORDER — HEPARIN SODIUM (PORCINE) 1000 UNIT/ML DIALYSIS: 1000.0000 [IU] | INTRAMUSCULAR | Status: DC | PRN

## 2021-09-22 MED ORDER — DEXTROSE 10 % IV SOLN: INTRAVENOUS | Status: DC

## 2021-09-22 MED ORDER — METHYLPREDNISOLONE SODIUM SUCC 40 MG IJ SOLR
40.0000 mg | INTRAMUSCULAR | Status: DC
  Administered 2021-09-22: 40 mg via INTRAVENOUS
  Filled 2021-09-22: qty 1

## 2021-09-22 MED ORDER — MIDAZOLAM-SODIUM CHLORIDE 100-0.9 MG/100ML-% IV SOLN
0.5000 mg/h | INTRAVENOUS | Status: DC
  Administered 2021-09-22: 0.5 mg/h via INTRAVENOUS
  Filled 2021-09-22: qty 100

## 2021-09-22 MED ORDER — STERILE WATER FOR INJECTION IJ SOLN
INTRAMUSCULAR | Status: AC
  Filled 2021-09-22: qty 10

## 2021-09-22 MED ORDER — PROSOURCE TF20 ENFIT COMPATIBL EN LIQD
60.0000 mL | Freq: Every day | ENTERAL | Status: DC
  Administered 2021-09-22 – 2021-09-23 (×2): 60 mL
  Filled 2021-09-22 (×2): qty 60

## 2021-09-22 MED ORDER — SODIUM BICARBONATE 8.4 % IV SOLN
100.0000 meq | Freq: Once | INTRAVENOUS | Status: AC
  Administered 2021-09-22: 100 meq via INTRAVENOUS

## 2021-09-22 MED ORDER — VITAMIN B-12 1000 MCG PO TABS
1000.0000 ug | ORAL_TABLET | Freq: Every day | ORAL | Status: DC
Start: 2021-09-22 — End: 2021-09-23
  Administered 2021-09-23: 1000 ug
  Filled 2021-09-22 (×2): qty 1

## 2021-09-22 MED ORDER — ZINC SULFATE 220 (50 ZN) MG PO CAPS
220.0000 mg | ORAL_CAPSULE | Freq: Every day | ORAL | Status: DC
  Administered 2021-09-22 – 2021-09-23 (×2): 220 mg
  Filled 2021-09-22 (×2): qty 1

## 2021-09-22 MED ORDER — SODIUM CHLORIDE 0.9 % IV SOLN
2.0000 g | Freq: Two times a day (BID) | INTRAVENOUS | Status: DC
  Administered 2021-09-22 – 2021-09-23 (×2): 2 g via INTRAVENOUS
  Filled 2021-09-22 (×2): qty 12.5

## 2021-09-22 MED ORDER — VANCOMYCIN HCL 2000 MG/400ML IV SOLN
2000.0000 mg | Freq: Once | INTRAVENOUS | Status: AC
  Administered 2021-09-22: 2000 mg via INTRAVENOUS
  Filled 2021-09-22: qty 400

## 2021-09-22 MED ORDER — POLYETHYLENE GLYCOL 3350 17 G PO PACK
17.0000 g | PACK | Freq: Every day | ORAL | Status: DC
  Administered 2021-09-22 – 2021-09-23 (×2): 17 g
  Filled 2021-09-22 (×2): qty 1

## 2021-09-22 MED ORDER — SODIUM CHLORIDE 0.9 % IV SOLN: INTRAVENOUS | Status: DC | PRN

## 2021-09-22 MED ORDER — SODIUM BICARBONATE 8.4 % IV SOLN
100.0000 meq | INTRAVENOUS | Status: AC
Start: 2021-09-22 — End: 2021-09-22
  Administered 2021-09-22: 100 meq via INTRAVENOUS
  Filled 2021-09-22: qty 100

## 2021-09-22 MED ORDER — PHENYLEPHRINE CONCENTRATED 100MG/250ML (0.4 MG/ML) INFUSION SIMPLE
0.0000 ug/min | INTRAVENOUS | Status: DC
  Administered 2021-09-22 (×2): 400 ug/min via INTRAVENOUS
  Administered 2021-09-22: 200 ug/min via INTRAVENOUS
  Filled 2021-09-22 (×3): qty 250

## 2021-09-22 NOTE — Procedures (Signed)
Central Venous Catheter Insertion Procedure Note  Jobin Montelongo  [id number]  July 19, 1960  Date:09/22/21  Time:6:34 AM   Provider Performing:Fionnuala Hemmerich Jerilynn Mages Ayesha Rumpf   Procedure: Insertion of Non-tunneled Central Venous Catheter(36556)with US guidance (24818)    Indication(s) Hemodialysis  Consent Unable to obtain consent due to emergent nature of procedure.  Anesthesia Topical only with 1% lidocaine   Timeout Verified patient identification, verified procedure, site/side was marked, verified correct patient position, special equipment/implants available, medications/allergies/relevant history reviewed, required imaging and test results available.  Sterile Technique Maximal sterile technique including full sterile barrier drape, hand hygiene, sterile gown, sterile gloves, mask, hair covering, sterile ultrasound probe cover (if used).  Procedure Description Area of catheter insertion was cleaned with chlorhexidine and draped in sterile fashion.   With real-time ultrasound guidance a HD catheter was placed into the right internal jugular vein.  Nonpulsatile blood flow and easy flushing noted in all ports.  VBG was collected via Angiocath to verify correct vessel placement and was consistent with venous blood. The catheter was sutured in place and sterile dressing applied.  Complications/Tolerance None; patient tolerated the procedure well. Chest X-ray is ordered to verify placement for internal jugular or subclavian cannulation.  Chest x-ray is not ordered for femoral cannulation.  EBL Minimal  Specimen(s) None  Lestine Mount, [facility] 09/22/21 6:35 AM  Please see [email or web address] for pager details.  From 7A-7P if no response, please call [phone] After hours, please call ELink [phone]

## 2021-09-22 NOTE — TOC Progression Note (Signed)
Transition of Care [facility]) - Progression Note    Patient Details  Name: Thomas Waller MRN: [id number] Date of Birth: [birth year]  Transition of [facility]) CM/SW Contact  Tom-Johnson, Renea Ee, RN Phone Number: 09/22/2021, 3:34 PM  Clinical Narrative:     Code blue was called for Asystole yesterday 09/06. CPR administered for 20 mins. Patient currently intubated.  CM will continue to follow with needs as patient progresses with care.    Expected Discharge Plan: Home/Self Care Barriers to Discharge: Continued Medical Work up  Expected Discharge Plan and Services Expected Discharge Plan: Home/Self Care   Discharge Planning Services: CM Consult   Living arrangements for the past 2 months: Single Family Home                                       Social Determinants of Health (SDOH) Interventions Food Insecurity Interventions: Patient Refused Housing Interventions: Patient Refused Transportation Interventions: Patient Refused  Readmission Risk Interventions     No data to display

## 2021-09-22 NOTE — Progress Notes (Signed)
[facility] Progress Note  Name: Tevion Laforge MRN: [id number] DOB: [birth year]  Chief Complaint:  N/v and abdominal pain   Subjective:  He was moved to [facility] yesterday given declining renal function.  He had a cardiac arrest with ROSC, intubated.  He had a nontunneled RIJ catheter placed and nursing has setup CRRT - getting ready to start.  PT with uremia, acidosis, and hyperkalemia.   Anuric over 9/6.  He is on levo at 60 mcg/min, phenylephrine at 150 mcg/min, and vasopressin at 0.03 units/min.  He was ordered for keep even as tolerated and post filter fluids with bicarb.  Per RN required a paralytic earlier   Review of systems:  Unable to obtain secondary to intubated.   ----------------------------------- Background on consult:  HPI: The patient is a 61 y.o. year-old w/ hx of DJD, gout, HL, HTN , RA who was admitted here 8/27- 8/31 for hypercalcemia of malignancy, suspected lymphoma, AKI creat 1.9 at dc, ^LFT's. Pt rec'd IVF"s, calcitonin and Zometa and Ca improved from 15 > 11 at dc. CT showed diffuse LAN in abdomen/ pelvis and chest. R inguinal LN bx was done and pt dc'd. Pt returned to ED w/ abd pain and N/V  yesterday. In ED BP's were soft 105/ 75, HR 105, afebrile. Labs showed WBC ^18k, plts down, creat up 4.2, K 6.3, Na 130, Ca 10.9, alb 2.7, uric acid 9.5.  We are asked to see for AKI.       Intake/Output Summary (Last 24 hours) at 09/22/2021 0846 Last data filed at 09/22/2021 0800 Gross per 24 hour  Intake 701.97 ml  Output 500 ml  Net 201.97 ml    Vitals:  Vitals:   09/22/21 0730 09/22/21 0745 09/22/21 0751 09/22/21 0800  BP: 92/68   94/79  Pulse: (!) 114 (!) 114  (!) 112  Resp: 20 (!) 21  (!) 22  Temp: 98.6 F (37 C) 98.4 F (36.9 C)  98.1 F (36.7 C)  TempSrc:      SpO2: 92% 92% 93% 93%  Weight:      Height:   '5\' 10"'$  (1.778 m)      Physical Exam:  General adult male in bed critically ill  HEENT normocephalic atraumatic  Neck trachea midline;  increased neck circumference  Lungs coarse mechanical breath sounds. On 80% FIO2 and PEEP 5  Heart S1S2 no rub Abdomen obese habitus; abdominal contractions  Extremities diffuse edema  Neuro - does not respond to commands Access: RIJ nontunneled catheter  Medications reviewed   Labs:     Latest Ref Rng & Units 09/22/2021    6:00 AM 09/22/2021    4:13 AM 09/22/2021    4:08 AM  BMP  Glucose 70 - 99 mg/dL  87    BUN 6 - 20 mg/dL  141    Creatinine 0.61 - 1.24 mg/dL  6.51    Sodium 135 - 145 mmol/L 128  127  127   Potassium 3.5 - 5.1 mmol/L 5.8  5.9  5.7   Chloride 98 - 111 mmol/L  93    CO2 22 - 32 mmol/L  13    Calcium 8.9 - 10.3 mg/dL  10.5       Assessment/Plan:   AKI - in pt w/ recently discovered suspected lymphoma. Recent admit AKI creat 1.5- 2.0 related to hypercalcemia.  Note possible TLS though calcium is high. He is starting CRRT   Fluids : dialysate and prefilter fluids are 2 K and post-filter bicarb.  Will set dialysate at 2 liter/hr UF goal modified to zero mL UF for the first 6 hours then transition to keep even as tolerated  Cardiac arrest - supportive care per primary team  Shock - suspected cardiogenic. on pressors per primary team  Hyperkalemia Starting CRRT with fluids as above  Lymphadenopathy - abd/ pelvis/ chest by CT last admit, sp LN biopsy. Onc following. Suspected lymphoma.  Vol overload - starting on CRRT Thrombocytopenia - oncology following. They do not see any evidence of HUS at this time   Hyponatremia - follow with CRRT Metabolic acidosis - starting CRRT with bicarb post filter as above.  I have given 2 amps of bicarb and we are starting a bicarb gtt.   Hyperphosphatemia - follow with CRRT   I spoke with his wife in the ICU to update her; all questions answered.  This is all very new for them.  I spoke with the primary team who the nurse has called to bedside.    Claudia Desanctis, MD 09/22/2021 9:35 AM

## 2021-09-22 NOTE — Progress Notes (Signed)
I was called regarding this patient who had a cardiac arrest tonight in the setting of uremia, acidosis and hyperkalemia.  The plan is to initiate CRRT  I have written orders for prefilter 2 K, post filter bicarb and 2 k dialysate.  No heparin, keep even to start  Rounding team will see in AM   [place]

## 2021-09-22 NOTE — Procedures (Signed)
Arterial Catheter Insertion Procedure Note  Davaughn Hillyard  [id number]  [birth year]  Date:09/22/21  Time:12:10 AM    Provider Performing: Ulice Dash    Procedure: Insertion of Arterial Line [phone]) with US guidance (25852)   Indication(s) Blood pressure monitoring and/or need for frequent ABGs  Consent Unable to obtain consent due to emergent nature of procedure.  Anesthesia None   Time Out Verified patient identification, verified procedure, site/side was marked, verified correct patient position, special equipment/implants available, medications/allergies/relevant history reviewed, required imaging and test results available.   Sterile Technique Maximal sterile technique including full sterile barrier drape, hand hygiene, sterile gown, sterile gloves, mask, hair covering, sterile ultrasound probe cover (if used).   Procedure Description Area of catheter insertion was cleaned with chlorhexidine and draped in sterile fashion. With real-time ultrasound guidance an arterial catheter was placed into the right radial artery.  Appropriate arterial tracings confirmed on monitor.     Complications/Tolerance None; patient tolerated the procedure well.   EBL Minimal   Specimen(s) None

## 2021-09-22 NOTE — Progress Notes (Signed)
Pharmacy Antibiotic Note  Thomas Waller is a 61 y.o. male admitted on 10/02/2021 with sepsis.  Pharmacy has been consulted for Vanc and cefepime dosing. Patient currently on CRRT  Plan: Vanc 2 gms IV x 1, then 1250 mg IV q24hr while on CRRT Cefepime 2 gms IV q12hr while on CRRT Monitor dialysis plan, clinical status, C&S and vanc levels as needed  Height: '5\' 10"'$  (177.8 cm) Weight: (!) 136.8 kg (301 lb 9.4 oz) IBW/kg (Calculated) : 73  Temp (24hrs), Avg:97 F (36.1 C), Min:95 F (35 C), Max:98.6 F (37 C)  Recent Labs  Lab 10/06/2021 1147 09/30/2021 2225 09/21/21 0610 09/21/21 1514 09/21/21 2206 09/22/21 0413 09/22/21 0414 09/22/21 0859 09/22/21 1020 09/22/21 1319 09/22/21 1519  WBC 22.8*  --  18.9*  --  22.4*  --  31.1*  --  29.7*  --   --   CREATININE 3.86*   < > 4.76* 5.57* 6.14* 6.51*  --   --  6.10*  --   --   LATICACIDVEN  --   --   --   --  8.8*  --   --  6.6*  --  8.7* 8.9*   < > = values in this interval not displayed.    Estimated Creatinine Clearance: 17.9 mL/min (A) (by C-G formula based on SCr of 6.1 mg/dL (H)).    Allergies  Allergen Reactions   Zestril [Lisinopril] Cough    Antimicrobials this admission: Vanc 9/7 >>  Cefep 9/7 >>   Thank you for allowing pharmacy to be a part of this patient's care.  Alanda Slim, PharmD, [facility] Clinical Pharmacist Please see AMION for all Pharmacists' Contact Phone Numbers 09/22/2021, 5:07 PM

## 2021-09-22 NOTE — Progress Notes (Signed)
Per family request, eyes covered w/ NS gauze

## 2021-09-22 NOTE — Progress Notes (Signed)
eLink Physician-Brief Progress Note Patient Name: Thomas Waller DOB: 04-04-1960 MRN: [id number]   Date of Service  09/22/2021  HPI/Events of Note  Patient with diagnosis of lymphoma admitted with acute renal failure, hyponatremia, hyperkalemia, and other metabolic derangements who went into asystolic cardiac arrest on the floor and was successfully resuscitated, he was intubated and transferred to the ICU, he is currently vented and comatose.  eICU Interventions  New Patient Evaluation.        Kerry Kass Bonnee Zertuche 09/22/2021, 12:33 AM

## 2021-09-22 NOTE — Progress Notes (Signed)
[facility] Progress Note Patient Name: Ladarrius Bogdanski DOB: [birth year] MRN: [id number]   Date of Service  09/22/2021  HPI/Events of Note  Hypoglycemia - Blood glucose = 83 --> 72.  eICU Interventions  Plan: D10W IV infusion at 40 mL/hour.     Intervention Category Major Interventions: Other:  Lysle Dingwall 09/22/2021, 7:43 PM

## 2021-09-22 NOTE — Progress Notes (Signed)
NAME:  Thomas Waller, MRN:  [id number], DOB:  [birth year], LOS: 2 ADMISSION DATE:  09/28/2021, CONSULTATION DATE:  9/6 REFERRING MD: Dr. Lupita Leash, CHIEF COMPLAINT:  Cardiac arrest   History of Present Illness:  Thomas Waller is a 61 y.o. male with recently discovered T-cell lymphoma, HTN, HLD, gout, and RA who presented on 9/5 at the recommendation of his outpatient oncologist with severe abdominal and back pain pain and n/v, found to have AKI and multiple other metabolic derangements including metabolic acidosis, hyperkalemia, hypercalcemia, and hyponatremia. Oncology and Nephrology consulted for eval however on 9/6 he suffered an in-hospital cardiac arrest and was intubated and transferred to ICU.   Recently hospitalized 8/27-8/31 at [facility] where he presented with unexplained 20lb weight loss, fatigue, chills, and sweats, found to have AKI and hypercalcemia of malignancy with CT revealing extensive intraabdominal, retroperitoneal, neck, and chest lymphadenopathy. Underwent percutaneous biopsy which was not completely diagnostic but suggested T cell neoplasm. Per Oncology needs excisional biopsy prior to initiating chemo.   Pertinent  Medical History  HTN, recently discovered T-cell lymphoma, CKD stage II, HLD, gout, RA, Reiter's polyarthritis   Significant Hospital Events: Including procedures, antibiotic start and stop dates in addition to other pertinent events   9/6 Cardiac arrest, transferred to PCCM, started on Unasyn   Interim History / Subjective:  Remains intubated and sedated, increasing pressor requirement. Not yet started on CRRT.   Objective   Blood pressure 109/78, pulse (!) 118, temperature 98.4 F (36.9 C), resp. rate (!) 22, height '5\' 10"'$  (1.778 m), weight (!) 136.8 kg, SpO2 93 %. CVP:  [6 mmHg-37 mmHg] 19 mmHg  Vent Mode: PRVC FiO2 (%):  [60 %-80 %] 80 % Set Rate:  [18 bmp] 18 bmp Vt Set:  [580 mL] 580 mL PEEP:  [5 cmH20] 5 cmH20   Intake/Output Summary (Last 24  hours) at 09/22/2021 0750 Last data filed at 09/22/2021 9562 Gross per 24 hour  Intake 592.25 ml  Output 500 ml  Net 92.25 ml   Filed Weights   09/22/21 0327  Weight: (!) 136.8 kg    Examination: General: Critically ill appearing male, sedated HENT: Atraumatic, eyes open and non-blinking, pupils round and reactive to light Lungs: Coarse lungs sounds, diminished at base bilaterally, increased WOB on vent Cardiovascular: Regular rate and rhythm, increased intensity of heart sounds  Abdomen: Obese, distended  Extremities: Bilateral LE 2+pitting edema  Neuro: RASS -5  Resolved Hospital Problem list     Assessment & Plan:   In-house cardiac arrest s/p ROSC  Hx HTN Suspect cardiac arrest was secondary to multiple electrolyte derangements.  Echo scheduled for this AM. Will recheck troponin, lactic acid and ABG.  - f/u lactic acid, ABG, and troponin  - Echo as above   Shock, cardiogenic vs hypovolemic  Increasing pressor requirement. Multi-organ failure, intubated and needing CRRT. Obtaining Echo as above to assess for cardiogenic shock. Extremely edematous on exam, may be third spacing with significant intravascular depletion and need more fluids due to hypercalcemia.  - Titrate pressors to maintain MAP >65  - Echo as above  - LR 582m bolus   AKI AGMA Multiple electrolyte derangements  Baseline Cr 1.2-1.5. Cr significantly elevated to 6.51 prior to initiation of dialysis. Electrolyte derangments include hyperkalemia, hyercalcemia, and hyponatremia. [facility]. CRRT planned for this morning.  - CRRT  - Nephrology following, appreciate assistance  Leukocytosis  Concern for aspiration pneumonia  Started on  Unasyn after cardiac arrest last night for concern of aspiration pneumonia. Patient afebrile, WBC elevated to 31 which may be reactive in the setting of cardiac arrest. CXR with R sided effusion. Will  continue Unasyn at this time and obtain blood cultures.  - Obtain blood cultures  - Unasyn per Pharmacy   New T-cell neoplasm  Concern for tumor lysis syndrome  Percutaneous biopsy from 8/29 not completely diagnostic but suggestive of T-cell neoplasm. Oncology following here, needs excisional biopsy once stability has improved in order to initiate appropriate treatment, likely chemo. Some concern for tumor lysis syndrome, will recheck uric acid.  - f/u uric acid  - Awaiting excisional biopsy   Elevated liver enzymes   Increased after cardiac arrest as expected, however were also elevated prior. Will continue to monitor.  - Repeat CMP   Goals of Care  Updated wife and sister-in-law. Wife understandably is overwhelmed by patient appearing his normal self but then receiving cancer diagnosis and suffering cardiac arrest both in <2 weeks. Continuing full scope of care at this time.   Best Practice (right click and "Reselect all SmartList Selections" daily)   Diet/type: NPO, will start tube feeds  DVT prophylaxis: prophylactic heparin  GI prophylaxis: PPI Lines: Central line, Dialysis Catheter, Arterial Line, and yes and it is still needed Foley:  Yes, and it is still needed Code Status:  full code Last date of multidisciplinary goals of care discussion [9/7 with family]     Sabino Dick, [facility]

## 2021-09-22 NOTE — Progress Notes (Addendum)
PCCM note  Continues to be on max dose of levo, vaso and neo.  Lactic acid still elevated at 8.8 On bicarb, fluids. Broaden abx to vaco, cefepime Echo reviewed with LVEF 65 to 70%  Discussed DNR with family and they are not ready to make a decision on goals of care.  We will get palliative care involved Remains full code  Additional CC time- 30 mins  The patient is critically ill with multiple organ system failure and requires high complexity decision making for assessment and support, frequent evaluation and titration of therapies, advanced monitoring, review of radiographic studies and interpretation of complex data.   Critical Care Time devoted to patient care services, exclusive of separately billable procedures, described in this note is 35 minutes.   Marshell Garfinkel MD [facility] See Amion for pager  If no response to pager , please call [phone] until 7pm After 7:00 pm call Elink  [phone] 09/22/2021, 4:40 PM

## 2021-09-22 NOTE — Progress Notes (Signed)
Pharmacy Antibiotic Note  Thomas Waller is a 61 y.o. male admitted on 09/21/2021, now s/p cardiac arrest w/ concern for aspiration pneumonia.  Pharmacy has been consulted for Unasyn dosing.  Starting CRRT now.  Plan: Unasyn 3g IV Q8H.  Height: '5\' 10"'$  (177.8 cm) Weight: (!) 136.8 kg (301 lb 9.4 oz) IBW/kg (Calculated) : 73  Temp (24hrs), Avg:97.6 F (36.4 C), Min:97 F (36.1 C), Max:98.2 F (36.8 C)  Recent Labs  Lab 09/15/21 2013 09/19/2021 1147 10/04/2021 2225 09/21/21 0610 09/21/21 1514 09/21/21 2206 09/22/21 0414  WBC 15.9* 22.8*  --  18.9*  --  22.4* 31.1*  CREATININE 2.07* 3.86* 4.27* 4.76* 5.57* 6.14*  --   LATICACIDVEN  --   --   --   --   --  8.8*  --     Estimated Creatinine Clearance: 17.8 mL/min (A) (by C-G formula based on SCr of 6.14 mg/dL (H)).    Allergies  Allergen Reactions   Zestril [Lisinopril] Cough    Thank you for allowing pharmacy to be a part of this patient's care.  Wynona Neat, PharmD, BCPS  09/22/2021 5:30 AM

## 2021-09-22 NOTE — Progress Notes (Signed)
Pt increasingly diaphoretic and breathing over set rate on the ventilator despite continuous sedation. Per Dr Ruthann Cancer, CCM, give paralytic for respiratory vent synchrony. See MAR.

## 2021-09-22 NOTE — Progress Notes (Signed)
[facility] Progress Note Patient Name: Thomas Waller DOB: [birth year] MRN: [id number]   Date of Service  09/22/2021  HPI/Events of Note  ABG reviewed. K+ 5.7  eICU Interventions  Will verify K+ with pending am labs.        Kerry Kass Heike Pounds 09/22/2021, 4:46 AM

## 2021-09-22 NOTE — Progress Notes (Signed)
IP PROGRESS NOTE  Subjective:   Patient is currently intubated and sedated after he suffered cardiac arrest and successfully resuscitated.  Patient remains critically ill and on CRRT. Objective:  Vital signs in last 24 hours: Temp:  [97 F (36.1 C)-98.4 F (36.9 C)] 98.4 F (36.9 C) (09/07 0630) Pulse Rate:  [68-122] 118 (09/07 0630) Resp:  [9-31] 22 (09/07 0630) BP: (62-121)/(34-80) 109/78 (09/07 0630) SpO2:  [62 %-100 %] 93 % (09/07 0630) Arterial Line BP: (70-122)/(46-79) 86/58 (09/07 0630) FiO2 (%):  [60 %-80 %] 80 % (09/07 0334) Weight:  [301 lb 9.4 oz (136.8 kg)] 301 lb 9.4 oz (136.8 kg) (09/07 0327) Weight change:  Last BM Date : 09/21/21  Intake/Output from previous day: 09/06 0701 - 09/07 0700 In: 592.3 [I.V.:430.2; IV Piggyback:162.1] Out: 500 [Emesis/NG output:500]  Lab Results: Recent Labs    09/21/21 2206 09/21/21 2233 09/22/21 0414 09/22/21 0600  WBC 22.4*  --  31.1*  --   HGB 14.1   < > 15.9 16.3  HCT 40.1   < > 43.5 48.0  PLT 35*  --  37*  --    < > = values in this interval not displayed.    BMET Recent Labs    09/21/21 2206 09/21/21 2233 09/22/21 0413 09/22/21 0600  NA 131*   < > 127* 128*  K 5.9*   < > 5.9* 5.8*  CL 96*  --  93*  --   CO2 15*  --  13*  --   GLUCOSE 139*  --  87  --   BUN 134*  --  141*  --   CREATININE 6.14*  --  6.51*  --   CALCIUM 11.2*  --  10.5*  --    < > = values in this interval not displayed.    Studies/Results: DG CHEST PORT 1 VIEW  Result Date: 09/22/2021 CLINICAL DATA:  New central venous catheter placement. EXAM: PORTABLE CHEST 1 VIEW COMPARISON:  09/21/2021 FINDINGS: There is a ET tube with tip above the carina. Enteric tube tip and side port are below the GE junction. Left IJ catheter tip projects over the cavoatrial junction. New right IJ catheter tip is also noted in the projection of the cavoatrial junction. No pneumothorax identified. Lung volumes are low with atelectasis in both lung bases. Small right  pleural effusion. IMPRESSION: 1. New right IJ catheter tip projects over the cavoatrial junction. No pneumothorax. 2. Low lung volumes with bibasilar atelectasis and small right pleural effusion. Electronically Signed   By: Kerby Moors M.D.   On: 09/22/2021 06:55   DG CHEST PORT 1 VIEW  Result Date: 09/21/2021 CLINICAL DATA:  Intubation.  OG tube placement. EXAM: PORTABLE CHEST 1 VIEW COMPARISON:  Radiograph earlier today FINDINGS: Tip of the endotracheal tube is 3.6 cm from the carina. Left internal jugular central venous catheter tip overlies the mid SVC. Enteric tube tip below the diaphragm, side-port in the region of the gastroesophageal junction. Streaky bibasilar atelectasis, stable on the left and worsening on the right. No pneumothorax. Small right pleural effusion. IMPRESSION: 1. Endotracheal tube tip 3.6 cm from the carina. 2. Left internal jugular central venous catheter tip in the mid SVC. 3. Enteric tube tip below the diaphragm in the stomach, side-port in the region of the gastroesophageal junction. 4. Bibasilar atelectasis, stable on the left and worsening on the right. Small right pleural effusion. Electronically Signed   By: Keith Rake M.D.   On: 09/21/2021 23:38   DG  Abd Portable 1V  Result Date: 09/21/2021 CLINICAL DATA:  Intubation and OG tube placement. EXAM: PORTABLE ABDOMEN - 1 VIEW COMPARISON:  None Available. FINDINGS: Tip of the enteric tube is below the diaphragm in the stomach, the side port is in the region of the gastroesophageal junction. There is gaseous gastric distension bowel gas pattern is poorly evaluated on the current exam. IMPRESSION: Tip of the enteric tube below the diaphragm in the stomach, side-port in the region of the gastroesophageal junction. Recommend advancement of 4-5 cm for optimal placement. Electronically Signed   By: Keith Rake M.D.   On: 09/21/2021 23:36   DG CHEST PORT 1 VIEW  Result Date: 09/21/2021 CLINICAL DATA:  Acute kidney injury  EXAM: PORTABLE CHEST 1 VIEW COMPARISON:  09/11/2021 FINDINGS: Bibasilar atelectasis versus scarring. Trace right pleural effusion. No focal consolidation. No pneumothorax. Heart and mediastinal contours are unremarkable. No acute osseous abnormality. IMPRESSION: 1. Bibasilar atelectasis versus scarring. Trace right pleural effusion. Electronically Signed   By: Kathreen Devoid M.D.   On: 09/21/2021 10:04   VAS [place] LOWER EXTREMITY VENOUS (DVT)  Result Date: 10/10/2021  Lower Venous DVT Study Patient Name:  AIDIAN SALOMON  Date of Exam:   10/14/2021 Medical Rec #: [id number]        Accession #:    [id number] Date of Birth: 30-Aug-1960        Patient Gender: M Patient Age:   50 years Exam Location:  [facility] Procedure:      VAS [place] LOWER EXTREMITY VENOUS (DVT) Referring Phys: Bonnielee Haff --------------------------------------------------------------------------------  Indications: Edema.  Risk Factors: Cancer. Limitations: Poor ultrasound/tissue interface and body habitus. Comparison Study: No prior studies. Performing Technologist: Oliver Hum RVT  Examination Guidelines: A complete evaluation includes B-mode imaging, spectral Doppler, color Doppler, and power Doppler as needed of all accessible portions of each vessel. Bilateral testing is considered an integral part of a complete examination. Limited examinations for reoccurring indications may be performed as noted. The reflux portion of the exam is performed with the patient in reverse Trendelenburg.  +---------+---------------+---------+-----------+----------+--------------+ RIGHT    CompressibilityPhasicitySpontaneityPropertiesThrombus Aging +---------+---------------+---------+-----------+----------+--------------+ CFV      Full           Yes      Yes                                 +---------+---------------+---------+-----------+----------+--------------+ SFJ      Full                                                         +---------+---------------+---------+-----------+----------+--------------+ FV Prox  Full                                                        +---------+---------------+---------+-----------+----------+--------------+ FV Mid   Full                                                        +---------+---------------+---------+-----------+----------+--------------+  FV Distal               Yes      Yes                                 +---------+---------------+---------+-----------+----------+--------------+ PFV      Full                                                        +---------+---------------+---------+-----------+----------+--------------+ POP      Full           Yes      Yes                                 +---------+---------------+---------+-----------+----------+--------------+ PTV      Full                                                        +---------+---------------+---------+-----------+----------+--------------+ PERO     Full                                                        +---------+---------------+---------+-----------+----------+--------------+   +---------+---------------+---------+-----------+----------+--------------+ LEFT     CompressibilityPhasicitySpontaneityPropertiesThrombus Aging +---------+---------------+---------+-----------+----------+--------------+ CFV      Full           Yes      Yes                                 +---------+---------------+---------+-----------+----------+--------------+ SFJ      Full                                                        +---------+---------------+---------+-----------+----------+--------------+ FV Prox  Full                                                        +---------+---------------+---------+-----------+----------+--------------+ FV Mid   Full                                                         +---------+---------------+---------+-----------+----------+--------------+ FV Distal               Yes      Yes                                 +---------+---------------+---------+-----------+----------+--------------+  PFV      Full                                                        +---------+---------------+---------+-----------+----------+--------------+ POP      Full           Yes      Yes                                 +---------+---------------+---------+-----------+----------+--------------+ PTV      Full                                                        +---------+---------------+---------+-----------+----------+--------------+ PERO     Full                                                        +---------+---------------+---------+-----------+----------+--------------+     Summary: RIGHT: - There is no evidence of deep vein thrombosis in the lower extremity. However, portions of this examination were limited- see technologist comments above.  - No cystic structure found in the popliteal fossa.  LEFT: - There is no evidence of deep vein thrombosis in the lower extremity. However, portions of this examination were limited- see technologist comments above.  - No cystic structure found in the popliteal fossa. - Ultrasound characteristics of enlarged lymph nodes noted in the groin.  *See table(s) above for measurements and observations. Electronically signed by Deitra Mayo MD on 09/27/2021 at 6:01:03 PM.    Final    CT ABDOMEN PELVIS WO CONTRAST  Result Date: 09/24/2021 CLINICAL DATA:  Abdominal pain, nausea, vomiting, history colon cancer EXAM: CT ABDOMEN AND PELVIS WITHOUT CONTRAST TECHNIQUE: Multidetector CT imaging of the abdomen and pelvis was performed following the standard protocol without IV contrast. RADIATION DOSE REDUCTION: This exam was performed according to the departmental dose-optimization program which includes automated exposure control,  adjustment of the mA and/or kV according to patient size and/or use of iterative reconstruction technique. COMPARISON:  09/11/2021 FINDINGS: Lower chest: Bibasilar pleural effusions and atelectasis greater on RIGHT, increased from prior exam. Hepatobiliary: No focal hepatic abnormalities. Gallbladder unremarkable. Pancreas: Normal appearance Spleen: Enlarged, 17.5 x 5.5 x 13.7 cm (volume = 690 cm^3) Adrenals/Urinary Tract: Adrenal glands, kidneys, and ureters normal appearance. Bladder decompressed. Stomach/Bowel: Appendix not visualized. Stomach and bowel loops grossly unremarkable Vascular/Lymphatic: Atherosclerotic calcifications aorta. Aorta normal caliber. Extensive adenopathy including retrocrural, gastrosplenic ligament, periportal, retroperitoneal, inguinal, BILATERAL pelvic. Observe nodes have generally mildly increased in sizes versus previous exam. Reproductive: Unremarkable prostate gland and seminal vesicles Other: Scattered free fluid. Extensive subcutaneous edema. No free air. No hernia. Musculoskeletal: Small sclerotic lesion RIGHT sacrum unchanged. Degenerative disc disease changes thoracolumbar spine. IMPRESSION: Extensive adenopathy as above, with many of the nodes slightly increased in sizes versus previous exam with associated splenomegaly. Findings favor lymphoma, extensive metastatic disease not completely excluded. Bibasilar pleural effusions and atelectasis greater on RIGHT, increased from prior exam.  Scattered ascites and diffuse subcutaneous edema. Aortic Atherosclerosis (ICD10-I70.0). Electronically Signed   By: Lavonia Dana M.D.   On: 09/22/2021 14:08    Medications: I have reviewed the patient's current medications.  Assessment/Plan:  61 year old with:  1.  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  The final diagnosis is critical to initiating treatment at this time.  This will be deferred till he is more stable medically.  Continue supportive care for the time  being.  2.  Acute renal failure: Currently will receive CRRT  3.  Thrombocytopenia: Likely related to his lymphoproliferative disorder possible secondary causes.  I do not see any evidence of HUS at this time.  His platelets 448 upon discharge on August 31 presented with a platelet count of 67.  Currently his platelets are adequate and does not require any transfusion unless bleeding is detected.  3.  Prognosis and goals of care: Overall guarded given multiorgan dysfunction and underlying undiagnosed lymphoma.  4.  Follow up: We will continue to follow his progress during his hospitalization.      LOS: 2 days   Zola Button 09/22/2021, 7:06 AM

## 2021-09-22 NOTE — Progress Notes (Signed)
[facility] Progress Note Patient Name: Thomas Waller DOB: [birth year] MRN: [id number]   Date of Service  09/22/2021  HPI/Events of Note  VBG reviewed. K+ 5.8, confirmation from a.m. BMP pending.  eICU Interventions  Follow up K+ on BMP result this am.        Frederik Pear 09/22/2021, 6:37 AM

## 2021-09-23 ENCOUNTER — Inpatient Hospital Stay (HOSPITAL_COMMUNITY): Payer: BC Managed Care – PPO

## 2021-09-23 DIAGNOSIS — G9340 Encephalopathy, unspecified: Secondary | ICD-10-CM

## 2021-09-23 DIAGNOSIS — N179 Acute kidney failure, unspecified: Secondary | ICD-10-CM | POA: Diagnosis not present

## 2021-09-23 DIAGNOSIS — J690 Pneumonitis due to inhalation of food and vomit: Secondary | ICD-10-CM

## 2021-09-23 DIAGNOSIS — J9601 Acute respiratory failure with hypoxia: Secondary | ICD-10-CM

## 2021-09-23 DIAGNOSIS — E860 Dehydration: Secondary | ICD-10-CM | POA: Diagnosis not present

## 2021-09-23 DIAGNOSIS — R579 Shock, unspecified: Secondary | ICD-10-CM

## 2021-09-23 DIAGNOSIS — D72829 Elevated white blood cell count, unspecified: Secondary | ICD-10-CM

## 2021-09-23 LAB — POCT I-STAT 7, (LYTES, BLD GAS, ICA,H+H)
Acid-base deficit: 3 mmol/L — ABNORMAL HIGH (ref 0.0–2.0)
Bicarbonate: 20.4 mmol/L (ref 20.0–28.0)
Calcium, Ion: 0.98 mmol/L — ABNORMAL LOW (ref 1.15–1.40)
HCT: 45 % (ref 39.0–52.0)
Hemoglobin: 15.3 g/dL (ref 13.0–17.0)
O2 Saturation: 95 %
Patient temperature: 35.2
Potassium: 4.4 mmol/L (ref 3.5–5.1)
Sodium: 130 mmol/L — ABNORMAL LOW (ref 135–145)
TCO2: 21 mmol/L — ABNORMAL LOW (ref 22–32)
pCO2 arterial: 29.9 mmHg — ABNORMAL LOW (ref 32–48)
pH, Arterial: 7.434 (ref 7.35–7.45)
pO2, Arterial: 66 mmHg — ABNORMAL LOW (ref 83–108)

## 2021-09-23 LAB — RENAL FUNCTION PANEL
Albumin: 2.8 g/dL — ABNORMAL LOW (ref 3.5–5.0)
Albumin: 2.8 g/dL — ABNORMAL LOW (ref 3.5–5.0)
Albumin: 2.9 g/dL — ABNORMAL LOW (ref 3.5–5.0)
Anion gap: 24 — ABNORMAL HIGH (ref 5–15)
Anion gap: 24 — ABNORMAL HIGH (ref 5–15)
Anion gap: 25 — ABNORMAL HIGH (ref 5–15)
BUN: 112 mg/dL — ABNORMAL HIGH (ref 6–20)
BUN: 87 mg/dL — ABNORMAL HIGH (ref 6–20)
BUN: 76 mg/dL — ABNORMAL HIGH (ref 6–20)
CO2: 16 mmol/L — ABNORMAL LOW (ref 22–32)
CO2: 18 mmol/L — ABNORMAL LOW (ref 22–32)
CO2: 16 mmol/L — ABNORMAL LOW (ref 22–32)
Calcium: 8.8 mg/dL — ABNORMAL LOW (ref 8.9–10.3)
Calcium: 8.3 mg/dL — ABNORMAL LOW (ref 8.9–10.3)
Calcium: 8.1 mg/dL — ABNORMAL LOW (ref 8.9–10.3)
Chloride: 95 mmol/L — ABNORMAL LOW (ref 98–111)
Chloride: 92 mmol/L — ABNORMAL LOW (ref 98–111)
Chloride: 94 mmol/L — ABNORMAL LOW (ref 98–111)
Creatinine, Ser: 5.28 mg/dL — ABNORMAL HIGH (ref 0.61–1.24)
Creatinine, Ser: 4.05 mg/dL — ABNORMAL HIGH (ref 0.61–1.24)
Creatinine, Ser: 3.82 mg/dL — ABNORMAL HIGH (ref 0.61–1.24)
GFR, Estimated: 12 mL/min — ABNORMAL LOW (ref >60)
GFR, Estimated: 16 mL/min — ABNORMAL LOW (ref >60)
GFR, Estimated: 17 mL/min — ABNORMAL LOW (ref >60)
Glucose, Bld: 82 mg/dL (ref 70–99)
Glucose, Bld: 122 mg/dL — ABNORMAL HIGH (ref 70–99)
Glucose, Bld: 107 mg/dL — ABNORMAL HIGH (ref 70–99)
Phosphorus: 7 mg/dL — ABNORMAL HIGH (ref 2.5–4.6)
Phosphorus: 5.5 mg/dL — ABNORMAL HIGH (ref 2.5–4.6)
Phosphorus: 6.5 mg/dL — ABNORMAL HIGH (ref 2.5–4.6)
Potassium: 5.3 mmol/L — ABNORMAL HIGH (ref 3.5–5.1)
Potassium: 4.7 mmol/L (ref 3.5–5.1)
Potassium: 4.9 mmol/L (ref 3.5–5.1)
Sodium: 135 mmol/L (ref 135–145)
Sodium: 134 mmol/L — ABNORMAL LOW (ref 135–145)
Sodium: 135 mmol/L (ref 135–145)

## 2021-09-23 LAB — CBC
HCT: 37 % — ABNORMAL LOW (ref 39.0–52.0)
HCT: 41.2 % (ref 39.0–52.0)
Hemoglobin: 13.8 g/dL (ref 13.0–17.0)
Hemoglobin: 15 g/dL (ref 13.0–17.0)
MCH: 30.5 pg (ref 26.0–34.0)
MCH: 30.4 pg (ref 26.0–34.0)
MCHC: 37.3 g/dL — ABNORMAL HIGH (ref 30.0–36.0)
MCHC: 36.4 g/dL — ABNORMAL HIGH (ref 30.0–36.0)
MCV: 81.7 fL (ref 80.0–100.0)
MCV: 83.6 fL (ref 80.0–100.0)
Platelets: 11 10*3/uL — CL (ref 150–400)
Platelets: 14 10*3/uL — CL (ref 150–400)
RBC: 4.53 MIL/uL (ref 4.22–5.81)
RBC: 4.93 MIL/uL (ref 4.22–5.81)
RDW: 15.3 % (ref 11.5–15.5)
RDW: 15.9 % — ABNORMAL HIGH (ref 11.5–15.5)
WBC: 23.6 10*3/uL — ABNORMAL HIGH (ref 4.0–10.5)
WBC: 26.9 10*3/uL — ABNORMAL HIGH (ref 4.0–10.5)
nRBC: 6.9 % — ABNORMAL HIGH (ref 0.0–0.2)
nRBC: 10 % — ABNORMAL HIGH (ref 0.0–0.2)

## 2021-09-23 LAB — HEPATIC FUNCTION PANEL
ALT: 244 U/L — ABNORMAL HIGH (ref 0–44)
AST: 272 U/L — ABNORMAL HIGH (ref 15–41)
Albumin: 2.8 g/dL — ABNORMAL LOW (ref 3.5–5.0)
Alkaline Phosphatase: 289 U/L — ABNORMAL HIGH (ref 38–126)
Bilirubin, Direct: 2.3 mg/dL — ABNORMAL HIGH (ref 0.0–0.2)
Indirect Bilirubin: 1.4 mg/dL — ABNORMAL HIGH (ref 0.3–0.9)
Total Bilirubin: 3.7 mg/dL — ABNORMAL HIGH (ref 0.3–1.2)
Total Protein: 4.6 g/dL — ABNORMAL LOW (ref 6.5–8.1)

## 2021-09-23 LAB — GLUCOSE, CAPILLARY
Glucose-Capillary: 123 mg/dL — ABNORMAL HIGH (ref 70–99)
Glucose-Capillary: 141 mg/dL — ABNORMAL HIGH (ref 70–99)
Glucose-Capillary: 92 mg/dL (ref 70–99)
Glucose-Capillary: 98 mg/dL (ref 70–99)

## 2021-09-23 LAB — MAGNESIUM: Magnesium: 2.5 mg/dL — ABNORMAL HIGH (ref 1.7–2.4)

## 2021-09-23 LAB — LACTIC ACID, PLASMA: Lactic Acid, Venous: >9 mmol/L (ref 0.5–1.9)

## 2021-09-23 LAB — PROTIME-INR
INR: 3 — ABNORMAL HIGH (ref 0.8–1.2)
Prothrombin Time: 31.2 seconds — ABNORMAL HIGH (ref 11.4–15.2)

## 2021-09-23 LAB — LACTATE DEHYDROGENASE: LDH: 2625 U/L — ABNORMAL HIGH (ref 98–192)

## 2021-09-23 LAB — URIC ACID: Uric Acid, Serum: 4.8 mg/dL (ref 3.7–8.6)

## 2021-09-23 MED ORDER — SODIUM CHLORIDE 0.9 % IV SOLN: INTRAVENOUS | Status: DC

## 2021-09-23 MED ORDER — POLYVINYL ALCOHOL 1.4 % OP SOLN: 1.0000 [drp] | Freq: Four times a day (QID) | OPHTHALMIC | Status: DC | PRN

## 2021-09-23 MED ORDER — LACTATED RINGERS IV BOLUS
500.0000 mL | Freq: Once | INTRAVENOUS | Status: AC
  Administered 2021-09-23: 500 mL via INTRAVENOUS

## 2021-09-23 MED ORDER — ACETAMINOPHEN 650 MG RE SUPP: 650.0000 mg | Freq: Four times a day (QID) | RECTAL | Status: DC | PRN

## 2021-09-23 MED ORDER — PRISMASOL BGK 4/2.5 32-4-2.5 MEQ/L REPLACEMENT SOLN
Status: DC
  Filled 2021-09-23 (×2): qty 5000

## 2021-09-23 MED ORDER — MORPHINE 100MG IN NS 100ML (1MG/ML) PREMIX INFUSION
10.0000 mg/h | INTRAVENOUS | Status: DC
  Administered 2021-09-23: 5 mg/h via INTRAVENOUS
  Filled 2021-09-23: qty 100

## 2021-09-23 MED ORDER — GLYCOPYRROLATE 0.2 MG/ML IJ SOLN: 0.2000 mg | INTRAMUSCULAR | Status: DC | PRN

## 2021-09-23 MED ORDER — ACETAMINOPHEN 325 MG PO TABS: 650.0000 mg | ORAL_TABLET | Freq: Four times a day (QID) | ORAL | Status: DC | PRN

## 2021-09-23 MED ORDER — PANTOPRAZOLE 2 MG/ML SUSPENSION
40.0000 mg | Freq: Every day | ORAL | Status: DC
  Administered 2021-09-23: 40 mg
  Filled 2021-09-23: qty 20

## 2021-09-23 MED ORDER — VASOPRESSIN 20 UNITS/100 ML INFUSION FOR SHOCK
0.0000 [IU]/min | INTRAVENOUS | Status: DC
  Administered 2021-09-23: 0.04 [IU]/min via INTRAVENOUS

## 2021-09-23 MED ORDER — GLYCOPYRROLATE 1 MG PO TABS: 1.0000 mg | ORAL_TABLET | ORAL | Status: DC | PRN

## 2021-09-23 MED ORDER — PRISMASOL BGK 4/2.5 32-4-2.5 MEQ/L EC SOLN
Status: DC
  Filled 2021-09-23 (×10): qty 5000

## 2021-09-24 LAB — CULTURE, RESPIRATORY W GRAM STAIN

## 2021-09-24 LAB — PATHOLOGIST SMEAR REVIEW

## 2021-09-27 LAB — CULTURE, BLOOD (ROUTINE X 2)
Culture: NO GROWTH
Culture: NO GROWTH
Special Requests: ADEQUATE
Special Requests: ADEQUATE

## 2021-10-16 NOTE — Progress Notes (Signed)
[facility] Progress Note  Name: Thomas Waller MRN: [id number] DOB: [birth year]  Chief Complaint:  N/v and abdominal pain   Subjective:  Seen and examined on CRRT and procedure supervised.  He had 2.0 liters UF over 9/7 with CRRT.  No UOP over 9/7.   CRRT started 9/7 am after tunneled catheter with critical care.  He has been getting quite a bit of fluid in including a bicarb gtt.  He has come off of neo, levo is at 50 mcg/min, and vaso is at 0.03 units/min.    Review of systems:  Unable to obtain secondary to intubated.   ----------------------------------- Background on consult:  HPI: The patient is a 62 y.o. year-old w/ hx of DJD, gout, HL, HTN , RA who was admitted here 8/27- 8/31 for hypercalcemia of malignancy, suspected lymphoma, AKI creat 1.9 at dc, ^LFT's. Pt rec'd IVF"s, calcitonin and Zometa and Ca improved from 15 > 11 at dc. CT showed diffuse LAN in abdomen/ pelvis and chest. R inguinal LN bx was done and pt dc'd. Pt returned to ED w/ abd pain and N/V  yesterday. In ED BP's were soft 105/ 75, HR 105, afebrile. Labs showed WBC ^18k, plts down, creat up 4.2, K 6.3, Na 130, Ca 10.9, alb 2.7, uric acid 9.5.  We are asked to see for AKI.       Intake/Output Summary (Last 24 hours) at Oct 17, 2021 0808 Last data filed at 10/17/21 0700 Gross per 24 hour  Intake 9197.95 ml  Output 2865.4 ml  Net 6332.55 ml    Vitals:  Vitals:   Oct 17, 2021 0630 10/17/2021 0645 2021/10/17 0700 10-17-21 0751  BP: (!) 94/45  (!) 90/32   Pulse: 74 74 75 78  Resp: 16 11 (!) 30 (!) 30  Temp: (!) 94.5 F (34.7 C) (!) 94.6 F (34.8 C) (!) 94.6 F (34.8 C) (!) 95 F (35 C)  TempSrc:      SpO2: 95% 95% 95% 95%  Weight:      Height:         Physical Exam:    General adult male in bed critically ill  HEENT normocephalic atraumatic  Neck trachea midline; increased neck circumference  Lungs coarse mechanical breath sounds. On 50% FIO2 and PEEP 5  Heart S1S2 no rub Abdomen obese habitus;  abdominal contractions  Extremities diffuse edema  Neuro - does not respond to commands Skin toes are dark/dusky and per RN report appears a bit better than at shift change Access: RIJ nontunneled catheter  Medications reviewed   Labs:     Latest Ref Rng & Units 2021/10/17    4:11 AM 09/22/2021    3:14 PM 09/22/2021   10:20 AM  BMP  Glucose 70 - 99 mg/dL 122  82  75   BUN 6 - 20 mg/dL 87  112  132   Creatinine 0.61 - 1.24 mg/dL 4.05  5.28  6.10   Sodium 135 - 145 mmol/L 134  135  134   Potassium 3.5 - 5.1 mmol/L 4.7  5.3  5.6   Chloride 98 - 111 mmol/L 92  95  99   CO2 22 - 32 mmol/L '18  16  16   '$ Calcium 8.9 - 10.3 mg/dL 8.3  8.8  9.7      Assessment/Plan:   AKI - in pt w/ recently discovered suspected lymphoma. Recent admit AKI creat 1.5- 2.0 related to hypercalcemia.  TLS seems less likely as calcium was high (improving with CRRT).  CRRT started on 9/7 after nontunneled catheter with critical care  Continue CRRT    Fluids : transition to standard CRRT fluids (stop post-filter bicarb)  Dialysate at 2 liter/hr.  Transition to 4K for dialysate  UF goal keep even as tolerated   Stop bicarb gtt  Cardiac arrest - supportive care per primary team  Shock - suspected cardiogenic. on pressors per primary team  Antibiotics per primary team  Hyperkalemia on CRRT, improved  Lymphadenopathy - abd/ pelvis/ chest by CT last admit, sp LN biopsy. Onc following. Suspected lymphoma.  Vol overload - on CRRT as above Thrombocytopenia - oncology following.  Platelets per primary team and hem/onc discretion.  They do not see any evidence of HUS at this time   Hyponatremia - follow with CRRT Metabolic acidosis - on CRRT.  Transition to standard fluids.  Stop bicarb gtt.  Note LA may be high secondary to malignancy, liver dz   Hyperphosphatemia - improved with CRRT  Acute liver injury - transaminitis and hypoglycemia  Disposition - continue ICU monitoring    Claudia Desanctis, MD 09/25/2021 8:37  AM

## 2021-10-16 NOTE — Progress Notes (Signed)
Nutrition Brief Note  Chart reviewed. Palliative care consulted with plans to transitioning to comfort care.  No further nutrition interventions planned at this time.  Please re-consult as needed.   Lockie Pares., RD, LDN, CNSC See AMiON for contact information

## 2021-10-16 NOTE — Progress Notes (Signed)
Extended family, to include wife, at [facility]. Waiting on Chaplain for withdrawal of care.

## 2021-10-16 NOTE — Progress Notes (Signed)
Pt extubated to comfort care per order with family and RN at bedside.

## 2021-10-16 NOTE — Progress Notes (Signed)
PT Cancellation Note  Patient Details Name: Thomas Waller MRN: [id number] DOB: 03/21/1960   Cancelled Treatment:    Reason Eval/Treat Not Completed: Patient not medically ready.  Pt intubated, sedated/paralyzed, placed on CRRT with pallative consult placed. Will continue to follow pt and check back on pt first of next week. 2021/10/15  Ginger Carne., PT Acute Rehabilitation Services [phone]  (pager) [phone]  (office)   Tessie Fass Filomena Pokorney 10-15-21, 1:18 PM

## 2021-10-16 NOTE — Progress Notes (Signed)
NAME:  Thomas Waller, MRN:  [id number], DOB:  [birth year], LOS: 3 ADMISSION DATE:  09/17/2021, CONSULTATION DATE:  9/6 REFERRING MD: Dr. Lupita Leash, CHIEF COMPLAINT:  Cardiac arrest   History of Present Illness:  Thomas Waller is a 61 y.o. male with recently discovered T-cell lymphoma, HTN, HLD, gout, and RA who presented on 9/5 at the recommendation of his outpatient oncologist with severe abdominal and back pain pain and n/v, found to have AKI and multiple other metabolic derangements including metabolic acidosis, hyperkalemia, hypercalcemia, and hyponatremia. Oncology and Nephrology consulted for eval however on 9/6 he suffered an in-hospital cardiac arrest and was intubated and transferred to ICU.   Recently hospitalized 8/27-8/31 at [facility] where he presented with unexplained 20lb weight loss, fatigue, chills, and sweats, found to have AKI and hypercalcemia of malignancy with CT revealing extensive intraabdominal, retroperitoneal, neck, and chest lymphadenopathy. Underwent percutaneous biopsy which was not completely diagnostic but suggested T cell neoplasm. Per Oncology needs excisional biopsy prior to initiating chemo.   Pertinent  Medical History  HTN, recently discovered presumed lymphoma without tissue diagnosis, CKD stage II, HLD, gout, RA, Reiter's polyarthritis   Significant Hospital Events: Including procedures, antibiotic start and stop dates in addition to other pertinent events   9/6 Cardiac arrest, transferred to [facility], started on Unasyn  9/7 antibiotics broadened to Vanc and Cefepime  Interim History / Subjective:  Overnight was weaned off Neo, remains on Levo and Vaso. Intubated and sedated.    Objective   Blood pressure (!) 94/45, pulse 75, temperature (!) 94.6 F (34.8 C), resp. rate (!) 30, height '5\' 10"'$  (1.778 m), weight (!) 136.8 kg, SpO2 95 %. CVP:  [3 mmHg-17 mmHg] 14 mmHg  Vent Mode: PRVC FiO2 (%):  [50 %-100 %] 50 % Set Rate:  [18 bmp-30 bmp] 30 bmp Vt Set:  [580  mL] 580 mL PEEP:  [5 cmH20] 5 cmH20 Plateau Pressure:  [20 cmH20-28 cmH20] 26 cmH20   Intake/Output Summary (Last 24 hours) at 10/10/2021 0739 Last data filed at 10-10-21 0700 Gross per 24 hour  Intake 9307.67 ml  Output 2865.4 ml  Net 6442.27 ml    Filed Weights   09/22/21 0327  Weight: (!) 136.8 kg    Examination: General: Critically ill appearing male, sedated HENT: Atraumatic, eyes open and non-blinking, pupils equal, round, and reactive to light Lungs: Coarse lungs sounds, diminished at base bilaterally, normal WOB on ventilator  Cardiovascular: Regular rate and rhythm Abdomen: Obese, distended  Extremities: Bilateral LE 2+pitting edema, mottled toes  Neuro: RASS -5  Labs  WBC 23.6 H/H 13.8/37 Platelets 11 <29  Na 134 K 4.7  Cr 4.05 on CRRT  Correct Ca 9.3 Phos 5.5 <7 Mg 2.5  7.34/29.9/66/20.4  Bcx no growth <24h  Resp cx few gram positive rods and cocci in clusters, rare gram negative rods  Pathologist smear review pending   Imaging  CXR today: Similar to prior with possible small BL pleural effusions and bibasilar atelectasis   Echo 9/7: LVEF 65-70%, no valvular or regional wall abnormalities  Resolved Hospital Problem list     Assessment & Plan:   Acute metabolic encephalopathy  Concern for anoxic brain injury  Patient is sedated and unresponsive. Pupils are sluggish but reactive to light. Concerned for anoxic brain injury after 20 minutes of CPR for asystole. Will reassess stability for CT head this afternoon. - CT head today if stable enough   Shock, multifactorial  Multi-organ failure, intubated and needing CRRT. Normal Echo making cardigoenic  shock unlikely. Currently on levo and vaso, able to wean off neo. Inititally concerned for hypovolemic shock and third spacing from hypercalcemia, patient fluid resuscitated and now +8L since admission. Worsening lactic acid to >9 on 9/7 raising concern for septic shock, broadened antibiotics to Vanc and  Cefepime. Blood cultures no growth <24h. Will lactic acid every 6 hours. - f/u lactic acid - Vanc dosing per pharmacy  - Cefepime 2g q12h  - f/u blood cultures  - Titrate pressors to maintain MAP >65   Concern for aspiration pneumonia  Leukocytosis Started on Unasyn after cardiac arrest on 9/6 for concern of aspiration pneumonia. CXR largely unchanged with evidence of atelectasis and BL pleural effusions. Respiratory cultures growing with few gram positive rods and cocci in clusters and rare gram negative rods. Continuing Vanc and Cefepime as above. Will follow cultures for speciation.  - f/u resp cultures - Vanc and Cefepime as above   In-house cardiac arrest s/p ROSC  Hx HTN Suspect cardiac arrest was secondary to multiple electrolyte derangements. Echo normal, troponins flat. Now on CRRT.  - CRRT   AKI AGMA Multiple electrolyte derangements  Baseline Cr 1.2-1.5. Cr significantly elevated to 6.51 prior to initiation of dialysis, now decreased Cr of 4.05. Electrolyte derangments include hyperkalemia, hyercalcemia, and hyponatremia. Received IV zolendronic acid '5mg'$  x1 at [facility] at for hypercalcemia of malignancy. Continuing CRRT.  - CRRT  - Nephrology following, appreciate assistance  Presumed T-cell lymphoma  Thrombocytopenia, worsening  Worsening thrombocytopenia of 11, will recheck at noon and transfuse platelets if <10. Percutaneous biopsy from 8/29 not completely diagnostic but suggestive of T-cell neoplasm. Oncology following here, needs excisional biopsy once stability has improved in order to initiate appropriate treatment, likely chemo. Some concern for tumor lysis syndrome, mildly elevated uric acid of 9.3 yesterday. Will trend daily uric acid and LDH.  - CBC at noon  - Transfuse for platelets <10 - f/u uric acid, LDH  - Excisional biopsy pending stability   Elevated liver enzymes  Increased after cardiac arrest as expected, however were also elevated prior. Will  continue to monitor.  - Daily CMP  Goals of Care  Updated wife, Corky Sox, and sister-in-law, Guerry Minors on patient's clinical deterioration and likely poor prognosis. Guerry Minors states that [facility] completely relies on patient as her caretaker due to mental health problems. Marcie understandably is overwhelmed by patient appearing his normal self but then receiving cancer diagnosis and suffering cardiac arrest both in <2 weeks. Consulted Palliative Care for more assistance with talking with wife about patient's status and goals of care.  - Palliative Care consulted   Best Practice (right click and "Reselect all SmartList Selections" daily)   Diet/type: tube feeds  DVT prophylaxis: None due to platelets <50k  GI prophylaxis: PPI Lines: Central line, Dialysis Catheter, Arterial Line, and yes and it is still needed Foley:  Yes, and it is still needed Code Status:  full code Last date of multidisciplinary goals of care discussion [9/7 with family]     Sabino Dick, [employer]

## 2021-10-16 NOTE — IPAL (Cosign Needed Addendum)
  Interdisciplinary Goals of Care Family Meeting   Date carried out: 18-Oct-2021  Location of the meeting: Bedside  Member's involved: Physician, Bedside Registered Nurse, and Family Member or next of kin, best friend  Durable Power of Attorney or acting medical decision maker: Wife, Higher education careers adviser    Discussion: We discussed goals of care for Thomas Waller. We shared that Jakie has continued to show clinical deterioration despite full-scope of life-sustaining intervention. Gave medical recommendation to change CODE STATUS to DNR and withdraw care. His wife, Corky Sox, stated that she does not want him to suffer and wants to focus on making him comfortable. Will change CODE STATUS to DNR and provide comfort care.    Code status: Full DNR  Disposition: In-patient comfort care  Time spent for the meeting: 10 minutes   Stefani Dama, Medical Student  10-18-21, 2:01 PM

## 2021-10-16 NOTE — Progress Notes (Signed)
OT Cancellation Note  Patient Details Name: Thomas Waller MRN: [id number] DOB: August 12, 1960   Cancelled Treatment:    Reason Eval/Treat Not Completed: Medical issues which prohibited therapy. Pt intubated, sedated/paralyzed, placed on CRRT with pallative consult placed. Will continue to follow pt and check back on pt first of next week.  [place], OTR/L [facility] [phone] Office [phone]   Tami, Barren Oct 02, 2021, 8:23 AM

## 2021-10-16 NOTE — Death Summary Note (Signed)
DEATH SUMMARY   Patient Details  Name: Thomas Waller MRN: [id number] DOB: December 11, 1960  Admission/Discharge Information   Admit Date:  09/27/2021  Date of Death: Date of Death: 2021/09/26  Time of Death: Time of Death: 1619-04-02  Length of Stay: 3  Referring Physician: Unk Pinto, MD   Reason(s) for Hospitalization  Acute renal failure  Diagnoses  Preliminary cause of death:  Secondary Diagnoses (including complications and co-morbidities):  Principal Problem:   AKI (acute kidney injury) [facility]) Active Problems:   Morbid obesity ([place])- BMI 35+ with htn, hyperlipidemia   Hypercalcemia of malignancy   Diffuse lymphadenopathy   Hyperkalemia   Hyponatremia   Abnormal LFTs   Thrombocytopenia (H[facility])   Nausea and vomiting   Dehydration   Cardiac arrest, cause unspecified ([place])   Shock ([place])   Leukocytosis   Aspiration pneumonia ([place])   Acute encephalopathy   Acute hypoxemic respiratory failure [facility])   Brief Hospital Course (including significant findings, care, treatment, and services provided and events leading to death)  Thomas Waller is a 61 year old male with RA, HTN, HLD and hx lymphadenopathy currently under work-up. He presented to [facility] on 09/24/22 with abdominal and back pain, found to have AKI and multiple electrolyte derangments.Transferred here 9/6 for HD for AKI, electrolyte abnormalities. Oncology and Nephrology consulted for eval however on 9/6 he suffered an in-hospital cardiac arrest and was intubated and transferred to ICU.    Recently hospitalized 8/27-8/31 at [facility] where he presented with unexplained 20lb weight loss, fatigue, chills, and sweats, found to have AKI and hypercalcemia of malignancy with CT revealing extensive intraabdominal, retroperitoneal, neck, and chest lymphadenopathy. Underwent percutaneous biopsy which was not completely diagnostic but suggested T cell neoplasm. Per Oncology needs excisional biopsy prior to initiating chemo.    Hospital  Course:  Transferred here 9/6 for HD for AKI, electrolyte abnormalities. Shortly after arriving was noted to be in asystole. Code Blue was called. S/p 20 min CPR before ROSC. Intubated and sedated, transferred to ICU. Started CRRT on 9/7, type of shock cardiogenic vs hypovolemic vs septic. Echo with LVEF 65-70%, no valvular or regional wall abnormalities. Fluid resuscitated for low intravascular volume secondary to hypercalcemia. Started on antibiotics for concern for aspiration pneumonia during cardiac arrest, coverage was broadened after worsening lactic acid. Continued to deteriorate with worsening shock refractory to maximal vasopressor support and unable to tolerate dialysis. Discussion was held with family on patient's poor prognosis including critical illness and probable lymphoma. After discussion with family, patient's code status was changed to DNR with comfort care only. Patient passed peacefully surrounded by family and Beatles music.     Pertinent Labs and Studies  Significant Diagnostic Studies DG CHEST PORT 1 VIEW  Result Date: 2021-09-26 CLINICAL DATA:  Hypoxemia EXAM: PORTABLE CHEST 1 VIEW COMPARISON:  AP chest 09/22/2021 FINDINGS: Right internal jugular central venous catheter sheath tip again overlies the superior vena cava/right atrial junction. Left internal jugular central venous catheter tip also overlies the superior vena cava/right atrial junction. Endotracheal tube tip terminates approximately 3.4 cm above the carina. Enteric tube descends below the diaphragm with the tip excluded by collimation. Moderately decreased lung volumes are similar to prior. Bilateral horizontal basilar linear platelike atelectasis is similar to prior. Cardiac silhouette is at the upper limits of normal size. Mediastinal contours are grossly within normal limits with mild calcification within the aortic arch. Mild bilateral interstitial thickening. Cephalization of the pulmonary vasculature. Possible small  bilateral pleural effusions. No pneumothorax. Moderate multilevel degenerative disc  changes of the thoracic spine. IMPRESSION: Support apparatus in appropriate position. Moderately decreased lung volumes with bibasilar platelike atelectasis and possible small bilateral pleural effusions, similar to prior. Probable mild interstitial pulmonary edema. Electronically Signed   By: Yvonne Kendall M.D.   On: 10/01/2021 08:45   ECHOCARDIOGRAM LIMITED  Result Date: 09/22/2021    ECHOCARDIOGRAM LIMITED REPORT   Patient Name:   Thomas Waller Date of Exam: 09/22/2021 Medical Rec #:  [id number]       Height:       70.0 in Accession #:    [id number]      Weight:       301.6 lb Date of Birth:  [birth year]       BSA:          2.485 m Patient Age:    54 years        BP:           94/79 mmHg Patient Gender: M               HR:           101 bpm. Exam Location:  Inpatient Procedure: 2D Echo, Limited Echo and Limited Color Doppler Indications:    Cardiac Arrest  History:        Patient has prior history of Echocardiogram examinations, most                 recent 09/13/2021. Risk Factors:Hypertension and Dyslipidemia.  Sonographer:    Memory Argue Referring Phys: [id number] [place]  1. Limited study to assess LV function post cardiac arrest.  2. Left ventricular ejection fraction, by estimation, is 65 to 70%. The left ventricle has normal function.  3. Right ventricular systolic function is normal. The right ventricular size is normal.  4. The mitral valve is normal in structure. No evidence of mitral valve regurgitation.  5. The aortic valve is tricuspid. Aortic valve regurgitation is not visualized. No aortic stenosis is present. Comparison(s): No significant change from prior study. FINDINGS  Left Ventricle: Left ventricular ejection fraction, by estimation, is 65 to 70%. The left ventricle has normal function. The left ventricular internal cavity size was normal in size. Right Ventricle: The right ventricular size  is normal. Right ventricular systolic function is normal. Left Atrium: Left atrial size was normal in size. Right Atrium: Right atrial size was normal in size. Pericardium: There is no evidence of pericardial effusion. Mitral Valve: The mitral valve is normal in structure. Mild mitral annular calcification. Tricuspid Valve: The tricuspid valve is normal in structure. Tricuspid valve regurgitation is not demonstrated. Aortic Valve: The aortic valve is tricuspid. Aortic valve regurgitation is not visualized. No aortic stenosis is present. Pulmonic Valve: The pulmonic valve was normal in structure. Pulmonic valve regurgitation is trivial. Additional Comments: Limited study to assess LV function post cardiac arrest. LEFT VENTRICLE PLAX 2D LVIDd:         3.50 cm LVIDs:         2.20 cm LV PW:         1.20 cm LV IVS:        2.10 cm  RIGHT VENTRICLE TAPSE (M-mode): 2.2 cm Kirk Ruths MD Electronically signed by Kirk Ruths MD Signature Date/Time: 09/22/2021/12:34:44 PM    Final    DG CHEST PORT 1 VIEW  Result Date: 09/22/2021 CLINICAL DATA:  New central venous catheter placement. EXAM: PORTABLE CHEST 1 VIEW COMPARISON:  09/21/2021 FINDINGS: There is a ET tube with tip above the  carina. Enteric tube tip and side port are below the GE junction. Left IJ catheter tip projects over the cavoatrial junction. New right IJ catheter tip is also noted in the projection of the cavoatrial junction. No pneumothorax identified. Lung volumes are low with atelectasis in both lung bases. Small right pleural effusion. IMPRESSION: 1. New right IJ catheter tip projects over the cavoatrial junction. No pneumothorax. 2. Low lung volumes with bibasilar atelectasis and small right pleural effusion. Electronically Signed   By: Kerby Moors M.D.   On: 09/22/2021 06:55   DG CHEST PORT 1 VIEW  Result Date: 09/21/2021 CLINICAL DATA:  Intubation.  OG tube placement. EXAM: PORTABLE CHEST 1 VIEW COMPARISON:  Radiograph earlier today FINDINGS:  Tip of the endotracheal tube is 3.6 cm from the carina. Left internal jugular central venous catheter tip overlies the mid SVC. Enteric tube tip below the diaphragm, side-port in the region of the gastroesophageal junction. Streaky bibasilar atelectasis, stable on the left and worsening on the right. No pneumothorax. Small right pleural effusion. IMPRESSION: 1. Endotracheal tube tip 3.6 cm from the carina. 2. Left internal jugular central venous catheter tip in the mid SVC. 3. Enteric tube tip below the diaphragm in the stomach, side-port in the region of the gastroesophageal junction. 4. Bibasilar atelectasis, stable on the left and worsening on the right. Small right pleural effusion. Electronically Signed   By: Keith Rake M.D.   On: 09/21/2021 23:38   DG Abd Portable 1V  Result Date: 09/21/2021 CLINICAL DATA:  Intubation and OG tube placement. EXAM: PORTABLE ABDOMEN - 1 VIEW COMPARISON:  None Available. FINDINGS: Tip of the enteric tube is below the diaphragm in the stomach, the side port is in the region of the gastroesophageal junction. There is gaseous gastric distension bowel gas pattern is poorly evaluated on the current exam. IMPRESSION: Tip of the enteric tube below the diaphragm in the stomach, side-port in the region of the gastroesophageal junction. Recommend advancement of 4-5 cm for optimal placement. Electronically Signed   By: Keith Rake M.D.   On: 09/21/2021 23:36   DG CHEST PORT 1 VIEW  Result Date: 09/21/2021 CLINICAL DATA:  Acute kidney injury EXAM: PORTABLE CHEST 1 VIEW COMPARISON:  09/11/2021 FINDINGS: Bibasilar atelectasis versus scarring. Trace right pleural effusion. No focal consolidation. No pneumothorax. Heart and mediastinal contours are unremarkable. No acute osseous abnormality. IMPRESSION: 1. Bibasilar atelectasis versus scarring. Trace right pleural effusion. Electronically Signed   By: Kathreen Devoid M.D.   On: 09/21/2021 10:04   VAS [place] LOWER EXTREMITY VENOUS  (DVT)  Result Date: 09/16/2021  Lower Venous DVT Study Patient Name:  Thomas Waller  Date of Exam:   09/24/2021 Medical Rec #: [id number]        Accession #:    [id number] Date of Birth: 11-25-1960        Patient Gender: M Patient Age:   22 years Exam Location:  [facility] Procedure:      VAS [place] LOWER EXTREMITY VENOUS (DVT) Referring Phys: Bonnielee Haff --------------------------------------------------------------------------------  Indications: Edema.  Risk Factors: Cancer. Limitations: Poor ultrasound/tissue interface and body habitus. Comparison Study: No prior studies. Performing Technologist: Oliver Hum RVT  Examination Guidelines: A complete evaluation includes B-mode imaging, spectral Doppler, color Doppler, and power Doppler as needed of all accessible portions of each vessel. Bilateral testing is considered an integral part of a complete examination. Limited examinations for reoccurring indications may be performed as noted. The reflux portion of the exam is performed with the  patient in reverse Trendelenburg.  +---------+---------------+---------+-----------+----------+--------------+ RIGHT    CompressibilityPhasicitySpontaneityPropertiesThrombus Aging +---------+---------------+---------+-----------+----------+--------------+ CFV      Full           Yes      Yes                                 +---------+---------------+---------+-----------+----------+--------------+ SFJ      Full                                                        +---------+---------------+---------+-----------+----------+--------------+ FV Prox  Full                                                        +---------+---------------+---------+-----------+----------+--------------+ FV Mid   Full                                                        +---------+---------------+---------+-----------+----------+--------------+ FV Distal               Yes      Yes                                  +---------+---------------+---------+-----------+----------+--------------+ PFV      Full                                                        +---------+---------------+---------+-----------+----------+--------------+ POP      Full           Yes      Yes                                 +---------+---------------+---------+-----------+----------+--------------+ PTV      Full                                                        +---------+---------------+---------+-----------+----------+--------------+ PERO     Full                                                        +---------+---------------+---------+-----------+----------+--------------+   +---------+---------------+---------+-----------+----------+--------------+ LEFT     CompressibilityPhasicitySpontaneityPropertiesThrombus Aging +---------+---------------+---------+-----------+----------+--------------+ CFV      Full           Yes      Yes                                 +---------+---------------+---------+-----------+----------+--------------+  SFJ      Full                                                        +---------+---------------+---------+-----------+----------+--------------+ FV Prox  Full                                                        +---------+---------------+---------+-----------+----------+--------------+ FV Mid   Full                                                        +---------+---------------+---------+-----------+----------+--------------+ FV Distal               Yes      Yes                                 +---------+---------------+---------+-----------+----------+--------------+ PFV      Full                                                        +---------+---------------+---------+-----------+----------+--------------+ POP      Full           Yes      Yes                                  +---------+---------------+---------+-----------+----------+--------------+ PTV      Full                                                        +---------+---------------+---------+-----------+----------+--------------+ PERO     Full                                                        +---------+---------------+---------+-----------+----------+--------------+     Summary: RIGHT: - There is no evidence of deep vein thrombosis in the lower extremity. However, portions of this examination were limited- see technologist comments above.  - No cystic structure found in the popliteal fossa.  LEFT: - There is no evidence of deep vein thrombosis in the lower extremity. However, portions of this examination were limited- see technologist comments above.  - No cystic structure found in the popliteal fossa. - Ultrasound characteristics of enlarged lymph nodes noted in the groin.  *See table(s) above for measurements and observations. Electronically signed by Deitra Mayo MD on 10/12/2021 at 6:01:03 PM.    Final    CT  ABDOMEN PELVIS WO CONTRAST  Result Date: 09/16/2021 CLINICAL DATA:  Abdominal pain, nausea, vomiting, history colon cancer EXAM: CT ABDOMEN AND PELVIS WITHOUT CONTRAST TECHNIQUE: Multidetector CT imaging of the abdomen and pelvis was performed following the standard protocol without IV contrast. RADIATION DOSE REDUCTION: This exam was performed according to the departmental dose-optimization program which includes automated exposure control, adjustment of the mA and/or kV according to patient size and/or use of iterative reconstruction technique. COMPARISON:  09/11/2021 FINDINGS: Lower chest: Bibasilar pleural effusions and atelectasis greater on RIGHT, increased from prior exam. Hepatobiliary: No focal hepatic abnormalities. Gallbladder unremarkable. Pancreas: Normal appearance Spleen: Enlarged, 17.5 x 5.5 x 13.7 cm (volume = 690 cm^3) Adrenals/Urinary Tract: Adrenal glands, kidneys, and  ureters normal appearance. Bladder decompressed. Stomach/Bowel: Appendix not visualized. Stomach and bowel loops grossly unremarkable Vascular/Lymphatic: Atherosclerotic calcifications aorta. Aorta normal caliber. Extensive adenopathy including retrocrural, gastrosplenic ligament, periportal, retroperitoneal, inguinal, BILATERAL pelvic. Observe nodes have generally mildly increased in sizes versus previous exam. Reproductive: Unremarkable prostate gland and seminal vesicles Other: Scattered free fluid. Extensive subcutaneous edema. No free air. No hernia. Musculoskeletal: Small sclerotic lesion RIGHT sacrum unchanged. Degenerative disc disease changes thoracolumbar spine. IMPRESSION: Extensive adenopathy as above, with many of the nodes slightly increased in sizes versus previous exam with associated splenomegaly. Findings favor lymphoma, extensive metastatic disease not completely excluded. Bibasilar pleural effusions and atelectasis greater on RIGHT, increased from prior exam. Scattered ascites and diffuse subcutaneous edema. Aortic Atherosclerosis (ICD10-I70.0). Electronically Signed   By: Lavonia Dana M.D.   On: 09/19/2021 14:08   [place] CORE BIOPSY (LYMPH NODES)  Result Date: 09/13/2021 INDICATION: Lymphadenopathy.  No malignant history. EXAM: ULTRASOUND-GUIDED RIGHT INGUINAL LYMPH NODE BIOPSY. COMPARISON:  CT AP, 09/11/2021. MEDICATIONS: None ANESTHESIA/SEDATION: Local anesthetic was administered. COMPLICATIONS: None immediate. TECHNIQUE: Informed written consent was obtained from the patient and/or patient's representative after a discussion of the risks, benefits and alternatives to treatment. Questions regarding the procedure were encouraged and answered. Initial ultrasound scanning demonstrated pathologically-enlarged RIGHT inguinal lymph nodes. An ultrasound image was saved for documentation purposes. The procedure was planned. A timeout was performed prior to the initiation of the procedure. The operative  was prepped and draped in the usual sterile fashion, and a sterile drape was applied covering the operative field. A timeout was performed prior to the initiation of the procedure. Local anesthesia was provided with 1% lidocaine with epinephrine. Under direct ultrasound guidance, an 18 gauge core needle device was utilized to obtain to obtain 3 core needle biopsies of the RIGHT inguinal lymph node. The samples were placed in saline and submitted to pathology. The needle was removed and hemostasis was achieved with manual compression. Post procedure scan was negative for significant hematoma. A dressing was placed. The patient tolerated the procedure well without immediate postprocedural complication. IMPRESSION: Successful ultrasound guided biopsy of an enlarged RIGHT lymph node, as above. Michaelle Birks, MD [facility] Electronically Signed   By: Michaelle Birks M.D.   On: 09/13/2021 17:16   ECHOCARDIOGRAM COMPLETE  Result Date: 09/13/2021    ECHOCARDIOGRAM REPORT   Patient Name:   Thomas Waller Date of Exam: 09/13/2021 Medical Rec #:  [id number]       Height:       70.0 in Accession #:    [id number]      Weight:       252.4 lb Date of Birth:  12-06-1960       BSA:  2.304 m Patient Age:    60 years        BP:           123/81 mmHg Patient Gender: M               HR:           85 bpm. Exam Location:  Inpatient Procedure: 2D Echo, Cardiac Doppler and Color Doppler Indications:    CAD  History:        Patient has no prior history of Echocardiogram examinations.                 Risk Factors:Hypertension and Dyslipidemia.  Sonographer:    Memory Argue Referring Phys: [id number] [place]  1. Left ventricular ejection fraction, by estimation, is 65 to 70%. The left ventricle has normal function. Left ventricular endocardial border not optimally defined to evaluate regional wall motion. There is moderate concentric left ventricular  hypertrophy. Left ventricular diastolic parameters are consistent with Grade I diastolic dysfunction (impaired relaxation).  2. Right ventricular systolic function is normal. The right ventricular size is normal. There is mildly elevated pulmonary artery systolic pressure. The estimated right ventricular systolic pressure is 51.8 mmHg.  3. The mitral valve is normal in structure. No evidence of mitral valve regurgitation. No evidence of mitral stenosis.  4. The aortic valve is tricuspid. There is mild thickening of the aortic valve. Aortic valve regurgitation is not visualized. Aortic valve sclerosis is present, with no evidence of aortic valve stenosis.  5. The inferior vena cava is normal in size with greater than 50% respiratory variability, suggesting right atrial pressure of 3 mmHg. Comparison(s): No prior Echocardiogram. FINDINGS  Left Ventricle: Left ventricular ejection fraction, by estimation, is 65 to 70%. The left ventricle has normal function. Left ventricular endocardial border not optimally defined to evaluate regional wall motion. The left ventricular internal cavity size was normal in size. There is moderate concentric left ventricular hypertrophy. Left ventricular diastolic parameters are consistent with Grade I diastolic dysfunction (impaired relaxation). Right Ventricle: The right ventricular size is normal. No increase in right ventricular wall thickness. Right ventricular systolic function is normal. There is mildly elevated pulmonary artery systolic pressure. The tricuspid regurgitant velocity is 3.03  m/s, and with an assumed right atrial pressure of 3 mmHg, the estimated right ventricular systolic pressure is 84.1 mmHg. Left Atrium: Left atrial size was normal in size. Right Atrium: Right atrial size was normal in size. Pericardium: There is no evidence of pericardial effusion. Mitral Valve: The mitral valve is normal in structure. No evidence of mitral valve regurgitation. No evidence of  mitral valve stenosis. Tricuspid Valve: The tricuspid valve is normal in structure. Tricuspid valve regurgitation is not demonstrated. No evidence of tricuspid stenosis. Aortic Valve: The aortic valve is tricuspid. There is mild thickening of the aortic valve. There is mild aortic valve annular calcification. Aortic valve regurgitation is not visualized. Aortic valve sclerosis is present, with no evidence of aortic valve  stenosis. Aortic valve mean gradient measures 9.0 mmHg. Aortic valve peak gradient measures 14.1 mmHg. Aortic valve area, by VTI measures 2.77 cm. Pulmonic Valve: The pulmonic valve was normal in structure. Pulmonic valve regurgitation is mild. No evidence of pulmonic stenosis. Aorta: The aortic root is normal in size and structure. Venous: The inferior vena cava is normal in size with greater than 50% respiratory variability, suggesting right atrial pressure of 3 mmHg. IAS/Shunts: No atrial level shunt detected by color flow Doppler.  LEFT VENTRICLE PLAX 2D LVIDd:         4.50 cm   Diastology LVIDs:         2.90 cm   LV e' medial:    9.68 cm/s LV PW:         1.20 cm   LV E/e' medial:  6.1 LV IVS:        1.20 cm   LV e' lateral:   13.30 cm/s LVOT diam:     2.10 cm   LV E/e' lateral: 4.4 LV SV:         98 LV SV Index:   42 LVOT Area:     3.46 cm  RIGHT VENTRICLE TAPSE (M-mode): 1.8 cm LEFT ATRIUM             Index        RIGHT ATRIUM           Index LA diam:        3.30 cm 1.43 cm/m   RA Area:     14.60 cm LA Vol (A2C):   51.3 ml 22.26 ml/m  RA Volume:   35.50 ml  15.41 ml/m LA Vol (A4C):   65.2 ml 28.29 ml/m LA Biplane Vol: 63.1 ml 27.38 ml/m  AORTIC VALVE AV Area (Vmax):    2.67 cm AV Area (Vmean):   2.65 cm AV Area (VTI):     2.77 cm AV Vmax:           188.00 cm/s AV Vmean:          140.000 cm/s AV VTI:            0.353 m AV Peak Grad:      14.1 mmHg AV Mean Grad:      9.0 mmHg LVOT Vmax:         145.00 cm/s LVOT Vmean:        107.000 cm/s LVOT VTI:          0.282 m LVOT/AV VTI ratio:  0.80  AORTA Ao Root diam: 3.30 cm MITRAL VALVE               TRICUSPID VALVE MV Area (PHT): 3.42 cm    TR Peak grad:   36.7 mmHg MV Decel Time: 222 msec    TR Vmax:        303.00 cm/s MV E velocity: 58.90 cm/s MV A velocity: 69.70 cm/s  SHUNTS MV E/A ratio:  0.85        Systemic VTI:  0.28 m                            Systemic Diam: 2.10 cm Rudean Haskell MD Electronically signed by Rudean Haskell MD Signature Date/Time: 09/13/2021/3:58:26 PM    Final    US RENAL  Result Date: 09/13/2021 CLINICAL DATA:  Acute kidney injury. EXAM: RENAL / URINARY TRACT ULTRASOUND COMPLETE COMPARISON:  None Available. FINDINGS: Right Kidney: Renal measurements: 11.6 x 5.4 x 5.9 cm = volume: 195 mL. Echogenicity within normal limits. No mass or hydronephrosis visualized. Left Kidney: Renal measurements: 12.0 x 5.9 x 6.9 cm = volume: 255 mL. Echogenicity within normal limits. No mass or hydronephrosis visualized. Bladder: Appears normal for degree of bladder distention. Other: None. IMPRESSION: Unremarkable study. No evidence for hydronephrosis or increased renal echogenicity to suggest medical renal disease. Electronically Signed   By: Misty Stanley M.D.   On: 09/13/2021 15:49   CT Head  Wo Contrast  Result Date: 09/12/2021 CLINICAL DATA:  Vomiting and lethargy. EXAM: CT HEAD WITHOUT CONTRAST TECHNIQUE: Contiguous axial images were obtained from the base of the skull through the vertex without intravenous contrast. RADIATION DOSE REDUCTION: This exam was performed according to the departmental dose-optimization program which includes automated exposure control, adjustment of the mA and/or kV according to patient size and/or use of iterative reconstruction technique. COMPARISON:  None Available. FINDINGS: Brain: No evidence of acute infarction, hemorrhage, hydrocephalus, extra-axial collection or mass lesion/mass effect. Vascular: No hyperdense vessel or unexpected calcification. Skull: Normal. Negative for fracture  or focal lesion. Sinuses/Orbits: No acute finding. Other: None. IMPRESSION: No acute intracranial pathology. Electronically Signed   By: Virgina Norfolk M.D.   On: 09/12/2021 00:40   CT Chest W Contrast  Result Date: 09/12/2021 CLINICAL DATA:  Right pleural effusion and possible lymphoma seen on earlier abdomen and pelvis CT. EXAM: CT CHEST WITH CONTRAST TECHNIQUE: Multidetector CT imaging of the chest was performed during intravenous contrast administration. RADIATION DOSE REDUCTION: This exam was performed according to the departmental dose-optimization program which includes automated exposure control, adjustment of the mA and/or kV according to patient size and/or use of iterative reconstruction technique. CONTRAST:  33m OMNIPAQUE IOHEXOL 300 MG/ML  SOLN COMPARISON:  None Available. FINDINGS: Cardiovascular: It should be noted that evaluation of the vascular structures is markedly limited secondary to suboptimal opacification with intravenous contrast. There is mild to moderate severity calcification of the aortic arch, without evidence of aortic aneurysm. Normal heart size with marked severity coronary artery calcification. No pericardial effusion. Mediastinum/Nodes: Numerous enlarged pretracheal and bilateral axillary lymph nodes are seen. The largest pretracheal lymph node measures approximately 2.7 cm x 1.5 cm (axial CT image 33, CT series 2). Numerous smaller AP window, subcarinal and bilateral hilar lymph nodes are noted (the largest is seen within the region of the AP window and measures 1.3 cm x 0.9 cm. Enlarged lymph nodes are also seen within the visualized neck soft tissues, posterior to the proximal portions of the bilateral clavicles (the largest measures 2.8 cm x 1.7 cm/axial CT image 5, CT series 2). Thyroid gland, trachea, and esophagus demonstrate no significant findings. Lungs/Pleura: Moderate severity inferior right upper lobe, right middle lobe, lingular and bilateral lower lobe linear  atelectasis is noted. There are small bilateral pleural effusions, right slightly greater than left. No pneumothorax is identified. Upper Abdomen: Numerous enlarged mesenteric lymph nodes are seen within the visualized portion of the upper abdomen. Musculoskeletal: Multilevel degenerative changes seen throughout the thoracic spine. IMPRESSION: 1. Extensive areas of lymphadenopathy within the neck, chest and visualized portion of the abdomen, consistent with a lymphoproliferative disorder. Correlation with tissue sampling is recommended. 2. Moderate severity bilateral linear atelectasis with small bilateral pleural effusions, right slightly greater than left. 3. Marked severity coronary artery disease. Aortic Atherosclerosis (ICD10-I70.0). Electronically Signed   By: TVirgina NorfolkM.D.   On: 09/12/2021 00:39   CT ABDOMEN PELVIS W CONTRAST  Result Date: 09/11/2021 CLINICAL DATA:  Right-sided abdominal pain EXAM: CT ABDOMEN AND PELVIS WITH CONTRAST TECHNIQUE: Multidetector CT imaging of the abdomen and pelvis was performed using the standard protocol following bolus administration of intravenous contrast. RADIATION DOSE REDUCTION: This exam was performed according to the departmental dose-optimization program which includes automated exposure control, adjustment of the mA and/or kV according to patient size and/or use of iterative reconstruction technique. CONTRAST:  829mOMNIPAQUE IOHEXOL 300 MG/ML  SOLN COMPARISON:  02/04/2018. FINDINGS: Lower chest: Lung bases demonstrate mild atelectatic  changes. Small right-sided pleural effusion is seen. Hepatobiliary: No focal liver abnormality is seen. No gallstones, gallbladder wall thickening, or biliary dilatation. Cholelithiasis seen on prior ultrasound is not well appreciated on this exam Pancreas: Pancreas appears within normal limits. Spleen: Normal in size without focal abnormality. Adrenals/Urinary Tract: Adrenal glands are within normal limits bilaterally.  Kidneys demonstrate a normal enhancement pattern. No calculi or obstructive changes are seen. The bladder is partially distended. Stomach/Bowel: No obstructive or inflammatory changes of the colon are seen. Scattered diverticular changes noted. The appendix is partially visualized. No definitive Peri appendiceal inflammatory changes to suggest appendicitis are noted. Vascular/Lymphatic: Vascular calcifications of the abdominal aorta are noted. Diffuse retroperitoneal and intra-abdominal adenopathy is identified. Index node in the portacaval space measures 3.3 cm in short axis. Multiple Peri aortic, pericaval and intra-aortocaval nodes are seen. Index node on image number 51 of series 2 measures 2.4 cm in short axis. Large iliac nodal masses are seen measuring 3.7 cm in short axis on the left and 2.5 cm in short axis on the right. Inguinal adenopathy is noted. Dominant node measures 3 cm in short axis on the left. Scattered smaller lymph nodes are seen. Reproductive: Prostate is unremarkable. Other: Mild free fluid is noted within the abdomen along the liver margin and scattered throughout the pelvis. Musculoskeletal: Degenerative changes of lumbar spine are noted. No acute bony abnormality is seen. IMPRESSION: Extensive intra-abdominal and retroperitoneal adenopathy as described. These changes are most consistent with lymphoma till proven otherwise. Tissue sampling is recommended for further evaluation. Be performed in the left inguinal region without difficulty. Appendix shows no findings to suggest appendicitis. Mild atelectatic changes with small right-sided pleural effusion. Previously seen gallstones are not well appreciated on this exam. Electronically Signed   By: Inez Catalina M.D.   On: 09/11/2021 21:15   DG Chest 2 View  Result Date: 09/11/2021 CLINICAL DATA:  Weakness.  Fever. EXAM: CHEST - 2 VIEW COMPARISON:  None Available. FINDINGS: Right basilar and left lingular opacities, somewhat platelike. No  pneumothorax. No nodules or masses. The cardiomediastinal silhouette is normal. IMPRESSION: Lingular and right basilar opacities. The platelike nature suggests the possibility of atelectasis. However, given history of fever, pneumonia is not excluded. Recommend short-term follow-up to ensure resolution. Electronically Signed   By: Dorise Bullion III M.D.   On: 09/11/2021 16:51    Microbiology Recent Results (from the past 240 hour(s))  MRSA Next Gen by PCR, Nasal     Status: None   Collection Time: 09/21/21 11:23 PM   Specimen: Nasal Mucosa; Nasal Swab  Result Value Ref Range Status   MRSA by PCR Next Gen NOT DETECTED NOT DETECTED Final    Comment: (NOTE) The GeneXpert MRSA Assay (FDA approved for NASAL specimens only), is one component of a comprehensive MRSA colonization surveillance program. It is not intended to diagnose MRSA infection nor to guide or monitor treatment for MRSA infections. Test performance is not FDA approved in patients less than 57 years old. Performed at [facility], [place] [address] [place], [place] [address]   Culture, Respiratory w Gram Stain     Status: None (Preliminary result)   Collection Time: 09/22/21  9:46 AM   Specimen: Tracheal Aspirate; Respiratory  Result Value Ref Range Status   Specimen Description TRACHEAL ASPIRATE  Final   Special Requests NONE  Final   Gram Stain   Final    FEW GRAM POSITIVE RODS FEW GRAM POSITIVE COCCI IN CLUSTERS RARE GRAM NEGATIVE RODS  NO WBC SEEN    Culture   Final    FEW STAPHYLOCOCCUS AUREUS RARE ESCHERICHIA COLI SUSCEPTIBILITIES TO FOLLOW Performed at [facility], [place] [address] [place], [place] [address]    Report Status PENDING  Incomplete  Culture, blood (Routine X 2) w Reflex to ID Panel     Status: None (Preliminary result)   Collection Time: 09/22/21 11:12 AM   Specimen: BLOOD  Result Value Ref Range Status   Specimen Description BLOOD A-LINE  Final   Special Requests   Final    BOTTLES  DRAWN AEROBIC AND ANAEROBIC Blood Culture adequate volume   Culture   Final    NO GROWTH < 24 HOURS Performed at [facility], [place] [address] [place], [place] [address]    Report Status PENDING  Incomplete  Culture, blood (Routine X 2) w Reflex to ID Panel     Status: None (Preliminary result)   Collection Time: 09/22/21 11:13 AM   Specimen: BLOOD  Result Value Ref Range Status   Specimen Description BLOOD A-LINE  Final   Special Requests   Final    BOTTLES DRAWN AEROBIC AND ANAEROBIC Blood Culture adequate volume   Culture   Final    NO GROWTH < 24 HOURS Performed at [facility], [place] [address], [place], [place] [address]    Report Status PENDING  Incomplete    Lab Basic Metabolic Panel: Recent Labs  Lab 09/22/21 0413 09/22/21 0600 09/22/21 1020 09/22/21 1434 09/22/21 1514 10/18/21 0411 10/18/2021 0825 2021-10-18 1125  NA 127*   < > 134*  --  135 134* 130* 135  K 5.9*   < > 5.6*  --  5.3* 4.7 4.4 4.9  CL 93*  --  99  --  95* 92*  --  94*  CO2 13*  --  16*  --  16* 18*  --  16*  GLUCOSE 87  --  75  --  82 122*  --  107*  BUN 141*  --  132*  --  112* 87*  --  76*  CREATININE 6.51*  --  6.10*  --  5.28* 4.05*  --  3.82*  CALCIUM 10.5*  --  9.7  --  8.8* 8.3*  --  8.1*  MG 3.2*  --   --   --   --  2.5*  --   --   PHOS 7.5*  --   --  6.7* 7.0*  6.8* 5.5*  --  6.5*   < > = values in this interval not displayed.   Liver Function Tests: Recent Labs  Lab 09/25/2021 1147 09/21/21 0610 09/21/21 2206 09/22/21 0413 09/22/21 1020 09/22/21 1514 10/18/2021 0411 2021/10/18 1117 10-18-2021 1125  AST 62* 52* 166*  --  264*  --   --  272*  --   ALT 68* 65* 134*  --  214*  --   --  244*  --   ALKPHOS 292* 289* 249*  --  239*  --   --  289*  --   BILITOT 2.9* 2.6* 2.3*  --  2.5*  --   --  3.7*  --   PROT 5.2* 5.3* 4.0*  --  4.8*  --   --  4.6*  --   ALBUMIN 2.6* 2.7* 2.2*   < > 2.9* 2.8* 2.8* 2.8* 2.9*   < > = values in this interval not displayed.   Recent Labs  Lab  10/09/2021 1147  LIPASE 30   No results for input(s): "AMMONIA" in the last 168 hours. CBC: Recent Labs  Lab 09/21/21 2206 09/21/21 2233 09/22/21 0414 09/22/21 0600 09/22/21 0935 09/22/21 1020 10/03/2021 0411 10-03-21 0825 10-03-2021 1117  WBC 22.4*  --  31.1*  --   --  29.7* 23.6*  --  26.9*  HGB 14.1   < > 15.9   < > 14.6 14.1 13.8 15.3 15.0  HCT 40.1   < > 43.5   < > 43.0 38.5* 37.0* 45.0 41.2  MCV 86.8  --  82.5  --   --  83.3 81.7  --  83.6  PLT 35*  --  37*  --   --  26* 11*  --  14*   < > = values in this interval not displayed.   Cardiac Enzymes: No results for input(s): "CKTOTAL", "CKMB", "CKMBINDEX", "TROPONINI" in the last 168 hours. Sepsis Labs: Recent Labs  Lab 09/22/21 0414 09/22/21 0859 09/22/21 1020 09/22/21 1319 09/22/21 1519 09/22/21 1752 09/22/21 2014 October 03, 2021 0411 2021/10/03 1117 10/03/21 1130  WBC 31.1*  --  29.7*  --   --   --   --  23.6* 26.9*  --   LATICACIDVEN  --    < >  --    < > 8.9* >9.0* 9.0*  --   --  >9.0*   < > = values in this interval not displayed.      Sharne Linders Rodman Pickle 2021/10/03, 5:59 PM

## 2021-10-16 NOTE — Progress Notes (Signed)
IP PROGRESS NOTE  Subjective:   Events noted overnight.  Patient remains intubated, sedated and currently on maximum support.  No bleeding noted on examination.   Objective:  Vital signs in last 24 hours: Temp:  [93.4 F (34.1 C)-98.6 F (37 C)] 95 F (35 C) (09/08 0751) Pulse Rate:  [68-114] 79 (09/08 0800) Resp:  [0-30] 30 (09/08 0800) BP: (70-119)/(17-102) 78/36 (09/08 0800) SpO2:  [91 %-100 %] 95 % (09/08 0800) Arterial Line BP: (70-344)/(50-216) 109/54 (09/08 0800) FiO2 (%):  [50 %-100 %] 50 % (09/08 0751) Weight change:  Last BM Date : 09/21/21  Intake/Output from previous day: 09/07 0701 - 09/08 0700 In: 9307.7 [I.V.:6267.7; NG/GT:1152; IV Piggyback:1888] Out: 2865.4 [Emesis/NG output:900]  Lab Results: Recent Labs    09/22/21 1020 Oct 02, 2021 0411 October 02, 2021 0825  WBC 29.7* 23.6*  --   HGB 14.1 13.8 15.3  HCT 38.5* 37.0* 45.0  PLT 26* 11*  --     BMET Recent Labs    09/22/21 1514 Oct 02, 2021 0411 02-Oct-2021 0825  NA 135 134* 130*  K 5.3* 4.7 4.4  CL 95* 92*  --   CO2 16* 18*  --   GLUCOSE 82 122*  --   BUN 112* 87*  --   CREATININE 5.28* 4.05*  --   CALCIUM 8.8* 8.3*  --     Studies/Results: ECHOCARDIOGRAM LIMITED  Result Date: 09/22/2021    ECHOCARDIOGRAM LIMITED REPORT   Patient Name:   Thomas Waller Date of Exam: 09/22/2021 Medical Rec #:  [id number]       Height:       70.0 in Accession #:    [id number]      Weight:       301.6 lb Date of Birth:  1960-10-02       BSA:          2.485 m Patient Age:    61 years        BP:           94/79 mmHg Patient Gender: M               HR:           101 bpm. Exam Location:  Inpatient Procedure: 2D Echo, Limited Echo and Limited Color Doppler Indications:    Cardiac Arrest  History:        Patient has prior history of Echocardiogram examinations, most                 recent 09/13/2021. Risk Factors:Hypertension and Dyslipidemia.  Sonographer:    Memory Argue Referring Phys: [id number] [place]  1. Limited  study to assess LV function post cardiac arrest.  2. Left ventricular ejection fraction, by estimation, is 65 to 70%. The left ventricle has normal function.  3. Right ventricular systolic function is normal. The right ventricular size is normal.  4. The mitral valve is normal in structure. No evidence of mitral valve regurgitation.  5. The aortic valve is tricuspid. Aortic valve regurgitation is not visualized. No aortic stenosis is present. Comparison(s): No significant change from prior study. FINDINGS  Left Ventricle: Left ventricular ejection fraction, by estimation, is 65 to 70%. The left ventricle has normal function. The left ventricular internal cavity size was normal in size. Right Ventricle: The right ventricular size is normal. Right ventricular systolic function is normal. Left Atrium: Left atrial size was normal in size. Right Atrium: Right atrial size was normal in size. Pericardium: There is no evidence of pericardial  effusion. Mitral Valve: The mitral valve is normal in structure. Mild mitral annular calcification. Tricuspid Valve: The tricuspid valve is normal in structure. Tricuspid valve regurgitation is not demonstrated. Aortic Valve: The aortic valve is tricuspid. Aortic valve regurgitation is not visualized. No aortic stenosis is present. Pulmonic Valve: The pulmonic valve was normal in structure. Pulmonic valve regurgitation is trivial. Additional Comments: Limited study to assess LV function post cardiac arrest. LEFT VENTRICLE PLAX 2D LVIDd:         3.50 cm LVIDs:         2.20 cm LV PW:         1.20 cm LV IVS:        2.10 cm  RIGHT VENTRICLE TAPSE (M-mode): 2.2 cm Kirk Ruths MD Electronically signed by Kirk Ruths MD Signature Date/Time: 09/22/2021/12:34:44 PM    Final    DG CHEST PORT 1 VIEW  Result Date: 09/22/2021 CLINICAL DATA:  New central venous catheter placement. EXAM: PORTABLE CHEST 1 VIEW COMPARISON:  09/21/2021 FINDINGS: There is a ET tube with tip above the carina.  Enteric tube tip and side port are below the GE junction. Left IJ catheter tip projects over the cavoatrial junction. New right IJ catheter tip is also noted in the projection of the cavoatrial junction. No pneumothorax identified. Lung volumes are low with atelectasis in both lung bases. Small right pleural effusion. IMPRESSION: 1. New right IJ catheter tip projects over the cavoatrial junction. No pneumothorax. 2. Low lung volumes with bibasilar atelectasis and small right pleural effusion. Electronically Signed   By: Kerby Moors M.D.   On: 09/22/2021 06:55   DG CHEST PORT 1 VIEW  Result Date: 09/21/2021 CLINICAL DATA:  Intubation.  OG tube placement. EXAM: PORTABLE CHEST 1 VIEW COMPARISON:  Radiograph earlier today FINDINGS: Tip of the endotracheal tube is 3.6 cm from the carina. Left internal jugular central venous catheter tip overlies the mid SVC. Enteric tube tip below the diaphragm, side-port in the region of the gastroesophageal junction. Streaky bibasilar atelectasis, stable on the left and worsening on the right. No pneumothorax. Small right pleural effusion. IMPRESSION: 1. Endotracheal tube tip 3.6 cm from the carina. 2. Left internal jugular central venous catheter tip in the mid SVC. 3. Enteric tube tip below the diaphragm in the stomach, side-port in the region of the gastroesophageal junction. 4. Bibasilar atelectasis, stable on the left and worsening on the right. Small right pleural effusion. Electronically Signed   By: Keith Rake M.D.   On: 09/21/2021 23:38   DG Abd Portable 1V  Result Date: 09/21/2021 CLINICAL DATA:  Intubation and OG tube placement. EXAM: PORTABLE ABDOMEN - 1 VIEW COMPARISON:  None Available. FINDINGS: Tip of the enteric tube is below the diaphragm in the stomach, the side port is in the region of the gastroesophageal junction. There is gaseous gastric distension bowel gas pattern is poorly evaluated on the current exam. IMPRESSION: Tip of the enteric tube below  the diaphragm in the stomach, side-port in the region of the gastroesophageal junction. Recommend advancement of 4-5 cm for optimal placement. Electronically Signed   By: Keith Rake M.D.   On: 09/21/2021 23:36   DG CHEST PORT 1 VIEW  Result Date: 09/21/2021 CLINICAL DATA:  Acute kidney injury EXAM: PORTABLE CHEST 1 VIEW COMPARISON:  09/11/2021 FINDINGS: Bibasilar atelectasis versus scarring. Trace right pleural effusion. No focal consolidation. No pneumothorax. Heart and mediastinal contours are unremarkable. No acute osseous abnormality. IMPRESSION: 1. Bibasilar atelectasis versus scarring. Trace right pleural effusion.  Electronically Signed   By: Kathreen Devoid M.D.   On: 09/21/2021 10:04    Medications: I have reviewed the patient's current medications.  Assessment/Plan:  61 year old with:  1.  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  We are limited in our ability to treat his disease as well as given adequate prognostication due to lack of biopsy.  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  He remains critically ill requiring maximum support and likely unstable to perform any additional biopsy.  If he stabilizes then pursuing a repeat biopsy at that time will be considered.  He continues to decline transitioning into comfort measures or DNR would be reasonable.  2.  Multiorgan dysfunction: Remains intubated, sedated as well as pressors.  By critical care team.  3.  Thrombocytopenia: His blood count is down to 11 likely related to his acute illness with possible lymphoproliferative disorder.  No objection to transfusion if needed.  I see no evidence of microangiopathy at this time.  4.  Prognosis and goals of care: His prognosis is poor given the aggressive nature of his lymphoma and multiorgan failure.  I support change in CODE STATUS to  DNR at least.  35  minutes were spent on this encounter.  The time was spent on reviewing his disease status, discussion with other consulting physicians, family members and outlining prognosis and future plan of care.     LOS: 3 days   Zola Button Oct 04, 2021, 8:30 AM

## 2021-10-16 DEATH — deceased

## 2021-11-23 MED FILL — Medication: Qty: 1 | Status: AC

## 2022-02-08 ENCOUNTER — Ambulatory Visit: Payer: BC Managed Care – PPO | Admitting: Nurse Practitioner

## 2022-04-05 ENCOUNTER — Encounter: Payer: BC Managed Care – PPO | Admitting: Nurse Practitioner
